# Patient Record
Sex: Female | Born: 1935 | Race: White | Hispanic: No | Marital: Married | Smoking: Never smoker
Health system: Southern US, Community
[De-identification: ages and names within clinical notes are randomized; demographics above are authoritative.]

## PROBLEM LIST (undated history)

## (undated) DIAGNOSIS — I38 Endocarditis, valve unspecified: Secondary | ICD-10-CM

## (undated) DIAGNOSIS — I10 Essential (primary) hypertension: Secondary | ICD-10-CM

## (undated) DIAGNOSIS — E876 Hypokalemia: Secondary | ICD-10-CM

## (undated) DIAGNOSIS — I4891 Unspecified atrial fibrillation: Secondary | ICD-10-CM

## (undated) DIAGNOSIS — I779 Disorder of arteries and arterioles, unspecified: Secondary | ICD-10-CM

## (undated) DIAGNOSIS — N189 Chronic kidney disease, unspecified: Secondary | ICD-10-CM

## (undated) DIAGNOSIS — C801 Malignant (primary) neoplasm, unspecified: Secondary | ICD-10-CM

## (undated) DIAGNOSIS — E119 Type 2 diabetes mellitus without complications: Secondary | ICD-10-CM

## (undated) DIAGNOSIS — I5032 Chronic diastolic (congestive) heart failure: Secondary | ICD-10-CM

## (undated) DIAGNOSIS — R0602 Shortness of breath: Secondary | ICD-10-CM

## (undated) DIAGNOSIS — R609 Edema, unspecified: Secondary | ICD-10-CM

## (undated) DIAGNOSIS — R600 Localized edema: Secondary | ICD-10-CM

## (undated) DIAGNOSIS — I2510 Atherosclerotic heart disease of native coronary artery without angina pectoris: Secondary | ICD-10-CM

## (undated) DIAGNOSIS — I272 Pulmonary hypertension, unspecified: Secondary | ICD-10-CM

## (undated) DIAGNOSIS — I739 Peripheral vascular disease, unspecified: Secondary | ICD-10-CM

## (undated) DIAGNOSIS — E785 Hyperlipidemia, unspecified: Secondary | ICD-10-CM

## (undated) DIAGNOSIS — I219 Acute myocardial infarction, unspecified: Secondary | ICD-10-CM

## (undated) HISTORY — DX: Peripheral vascular disease, unspecified: I73.9

## (undated) HISTORY — DX: Hypokalemia: E87.6

## (undated) HISTORY — DX: Essential (primary) hypertension: I10

## (undated) HISTORY — DX: Disorder of arteries and arterioles, unspecified: I77.9

## (undated) HISTORY — DX: Type 2 diabetes mellitus without complications: E11.9

## (undated) HISTORY — DX: Edema, unspecified: R60.9

## (undated) HISTORY — PX: CHOLECYSTECTOMY: SHX55

## (undated) HISTORY — DX: Chronic diastolic (congestive) heart failure: I50.32

## (undated) HISTORY — DX: Chronic kidney disease, unspecified: N18.9

## (undated) HISTORY — DX: Endocarditis, valve unspecified: I38

## (undated) HISTORY — DX: Acute myocardial infarction, unspecified: I21.9

## (undated) HISTORY — DX: Unspecified atrial fibrillation: I48.91

## (undated) HISTORY — PX: TOTAL ABDOMINAL HYSTERECTOMY: SHX209

## (undated) HISTORY — PX: SKIN CANCER EXCISION: SHX779

## (undated) HISTORY — PX: CARDIAC CATHETERIZATION: SHX172

## (undated) HISTORY — DX: Atherosclerotic heart disease of native coronary artery without angina pectoris: I25.10

## (undated) HISTORY — DX: Hyperlipidemia, unspecified: E78.5

## (undated) HISTORY — DX: Pulmonary hypertension, unspecified: I27.20

## (undated) HISTORY — DX: Localized edema: R60.0

---

## 1999-09-01 ENCOUNTER — Other Ambulatory Visit: Admission: RE | Admit: 1999-09-01 | Discharge: 1999-09-01 | Payer: Self-pay | Admitting: Family Medicine

## 2006-11-07 ENCOUNTER — Other Ambulatory Visit: Admission: RE | Admit: 2006-11-07 | Discharge: 2006-11-07 | Payer: Self-pay | Admitting: Family Medicine

## 2009-12-05 HISTORY — PX: CORONARY STENT PLACEMENT: SHX1402

## 2010-01-28 ENCOUNTER — Encounter: Payer: Self-pay | Admitting: Cardiology

## 2010-01-28 ENCOUNTER — Ambulatory Visit: Payer: Self-pay | Admitting: Cardiology

## 2010-01-29 ENCOUNTER — Encounter: Payer: Self-pay | Admitting: Cardiology

## 2010-01-30 ENCOUNTER — Encounter: Payer: Self-pay | Admitting: Cardiology

## 2010-02-01 ENCOUNTER — Encounter: Payer: Self-pay | Admitting: Cardiology

## 2010-02-03 ENCOUNTER — Encounter: Payer: Self-pay | Admitting: Cardiology

## 2010-02-04 ENCOUNTER — Ambulatory Visit: Payer: Self-pay | Admitting: Cardiovascular Disease

## 2010-02-04 ENCOUNTER — Inpatient Hospital Stay (HOSPITAL_COMMUNITY): Admission: RE | Admit: 2010-02-04 | Discharge: 2010-02-05 | Payer: Self-pay | Admitting: Cardiovascular Disease

## 2010-02-04 ENCOUNTER — Encounter: Payer: Self-pay | Admitting: Cardiology

## 2010-02-05 ENCOUNTER — Encounter: Payer: Self-pay | Admitting: Cardiology

## 2010-02-22 ENCOUNTER — Encounter: Payer: Self-pay | Admitting: Cardiology

## 2010-02-22 ENCOUNTER — Telehealth (INDEPENDENT_AMBULATORY_CARE_PROVIDER_SITE_OTHER): Payer: Self-pay | Admitting: *Deleted

## 2010-03-03 ENCOUNTER — Ambulatory Visit: Payer: Self-pay | Admitting: Cardiology

## 2010-03-03 DIAGNOSIS — E876 Hypokalemia: Secondary | ICD-10-CM

## 2010-03-03 DIAGNOSIS — R319 Hematuria, unspecified: Secondary | ICD-10-CM

## 2010-03-03 DIAGNOSIS — J984 Other disorders of lung: Secondary | ICD-10-CM

## 2010-03-03 DIAGNOSIS — I129 Hypertensive chronic kidney disease with stage 1 through stage 4 chronic kidney disease, or unspecified chronic kidney disease: Secondary | ICD-10-CM

## 2010-03-03 DIAGNOSIS — R0989 Other specified symptoms and signs involving the circulatory and respiratory systems: Secondary | ICD-10-CM | POA: Insufficient documentation

## 2010-03-03 DIAGNOSIS — N189 Chronic kidney disease, unspecified: Secondary | ICD-10-CM | POA: Insufficient documentation

## 2010-03-03 DIAGNOSIS — I509 Heart failure, unspecified: Secondary | ICD-10-CM | POA: Insufficient documentation

## 2010-03-03 DIAGNOSIS — I2510 Atherosclerotic heart disease of native coronary artery without angina pectoris: Secondary | ICD-10-CM

## 2010-03-03 DIAGNOSIS — I214 Non-ST elevation (NSTEMI) myocardial infarction: Secondary | ICD-10-CM

## 2010-03-03 DIAGNOSIS — R0609 Other forms of dyspnea: Secondary | ICD-10-CM

## 2010-03-12 ENCOUNTER — Ambulatory Visit: Payer: Self-pay | Admitting: Cardiology

## 2010-03-12 ENCOUNTER — Encounter: Payer: Self-pay | Admitting: Cardiology

## 2010-03-19 ENCOUNTER — Ambulatory Visit: Payer: Self-pay | Admitting: Cardiology

## 2010-04-02 ENCOUNTER — Ambulatory Visit: Payer: Self-pay | Admitting: Cardiology

## 2010-04-09 ENCOUNTER — Ambulatory Visit: Payer: Self-pay | Admitting: Cardiology

## 2010-05-10 ENCOUNTER — Telehealth (INDEPENDENT_AMBULATORY_CARE_PROVIDER_SITE_OTHER): Payer: Self-pay | Admitting: *Deleted

## 2010-05-31 ENCOUNTER — Ambulatory Visit: Payer: Self-pay | Admitting: Cardiology

## 2010-06-22 ENCOUNTER — Encounter: Payer: Self-pay | Admitting: Cardiology

## 2010-11-05 ENCOUNTER — Ambulatory Visit: Payer: Self-pay | Admitting: Cardiology

## 2011-01-04 NOTE — Assessment & Plan Note (Signed)
Summary: f/u on edema in feet/ankles --agh   Visit Type:  Follow-up Primary Provider:  Belva Agee PA  CC:  c/o leg edema.  History of Present Illness: the patient is an 77 year old female is here for coronary artery disease. Status post drug-eluting stent placement to the mid LAD in March of 2011. She has diabetes mellitus, hypercholesterolemia and hypertension. The patient also has moderate mitral regurgitation and moderate tricuspid regurgitation but no symptoms of heart failure. She has carotid artery disease and a recent carotid Dopplers done which showed less than 50% stenosis bilaterally. The patient had difficult to control blood pressure and is on multiple antihypertensive medications. She also has developed in the last several weeks late edema and was given by her primary care physician Lasix. Her edema has now resolved. Her blood pressure is also now under very good control. The fact that she is responding to diuretics is consistent with a plasma renin activity which was very low.  Cardiac standpoint the patient is doing well. She reports no chest pain shortness of breath orthopnea PND.  Preventive Screening-Counseling & Management  Alcohol-Tobacco     Smoking Status: never  Current Medications (verified): 1)  Tylenol 325 Mg Tabs (Acetaminophen) .... As Needed 2)  Tekturna 300 Mg Tabs (Aliskiren Fumarate) .... Take 1 Tablet By Mouth Once A Day 3)  Amaryl 2 Mg Tabs (Glimepiride) .... Take 1 Tablet By Mouth Once A Day 4)  Aspir-Trin 325 Mg Tbec (Aspirin) .... Take 1 Tablet By Mouth Once A Day 5)  Benazepril Hcl 20 Mg Tabs (Benazepril Hcl) .... Take 1 Tablet By Mouth Once A Day 6)  Plavix 75 Mg Tabs (Clopidogrel Bisulfate) .... Take 1 Tablet By Mouth Once A Day 7)  Metformin Hcl 1000 Mg Tabs (Metformin Hcl) .... Take 1 Tablet By Mouth Twice A Day 8)  Bystolic 20 Mg Tabs (Nebivolol Hcl) .... Take 1 Tablet By Mouth Once A Day 9)  Nitrostat 0.4 Mg Subl (Nitroglycerin) .... Use As  Directed 10)  Simvastatin 20 Mg Tabs (Simvastatin) .... Take 1 Tablet By Mouth Once A Day 11)  Trilipix 135 Mg Cpdr (Choline Fenofibrate) .... Take 1 Tablet By Mouth Once A Day 12)  Chlorthalidone 50 Mg Tabs (Chlorthalidone) .... Take 1 Tablet By Mouth Once A Day 13)  Percocet 5-325 Mg Tabs (Oxycodone-Acetaminophen) .... Take One By Mouth Every 6 Hours As Needed 14)  Amlodipine Besylate 10 Mg Tabs (Amlodipine Besylate) .... Take 1 Tablet By Mouth Once A Day 15)  Potassium Chloride Crys Cr 20 Meq Cr-Tabs (Potassium Chloride Crys Cr) .... Take 1/2 Tablet By Mouth Once A Day 16)  Furosemide 20 Mg Tabs (Furosemide) .... Take 1 Tablet By Mouth Once A Day  Allergies (verified): No Known Drug Allergies  Comments:  Nurse/Medical Assistant: The patient's medication bottles and allergies were reviewed with the patient and were updated in the Medication and Allergy Lists.  Past History:  Past Medical History: Last updated: 03/03/2010 Non ST elevated MI Respiratory distress Chronic renal insufficiency Hypertension Hypokalemia CHF DM  Past Surgical History: Last updated: 03/03/2010 Abdominal Hysterectomy-Total Cholecystectomy  Family History: Last updated: 03/03/2010 No strong hx of Coronary disease in family  Social History: Last updated: 03/03/2010 Retired  Alcohol Use - no Drug Use - no  Risk Factors: Smoking Status: never (05/31/2010)  Review of Systems  The patient denies fatigue, malaise, fever, weight gain/loss, vision loss, decreased hearing, hoarseness, chest pain, palpitations, shortness of breath, prolonged cough, wheezing, sleep apnea, coughing up blood, abdominal pain,  blood in stool, nausea, vomiting, diarrhea, heartburn, incontinence, blood in urine, muscle weakness, joint pain, leg swelling, rash, skin lesions, headache, fainting, dizziness, depression, anxiety, enlarged lymph nodes, easy bruising or bleeding, and environmental allergies.    Vital  Signs:  Patient profile:   76 year old female Height:      63 inches Weight:      121 pounds Pulse rate:   51 / minute BP sitting:   138 / 78  (left arm) Cuff size:   regular  Vitals Entered By: Carlye Grippe (May 31, 2010 11:11 AM) CC: c/o leg edema   Physical Exam  Additional Exam:  General: Well-developed, well-nourished in no distress, pale-appearing head: Normocephalic and atraumatic eyes PERRLA/EOMI intact, conjunctiva and lids normal nose: No deformity or lesions mouth normal dentition, normal posterior pharynx neck: Supple, no JVD.  No masses, thyromegaly or abnormal cervical nodes lungs: Normal breath sounds bilaterally without wheezing.  Normal percussion heart: regular rate and rhythm with normal S1 and S2, no S3 or S4.  PMI is normal.  No pathological murmurs abdomen: Normal bowel sounds, abdomen is soft and nontender without masses, organomegaly or hernias noted.  No hepatosplenomegaly musculoskeletal: Back normal, normal gait muscle strength and tone normal pulsus: Pulse is normal in all 4 extremities Extremities: No peripheral pitting edema neurologic: Alert and oriented x 3 skin: multiple facial skin lesions with keratosis cervical nodes: No significant adenopathy psychologic: Normal affect    Impression & Recommendations:  Problem # 1:  CAROTID BRUIT (ICD-785.9) no obstructive carotid artery disease.  Problem # 2:  ACUT MI SUBENDOCARDIAL INFARCT INIT EPIS CARE (ICD-410.71) status post stenting. Continue current therapy including Plavix Her updated medication list for this problem includes:    Aspir-trin 325 Mg Tbec (Aspirin) .Marland Kitchen... Take 1 tablet by mouth once a day    Benazepril Hcl 20 Mg Tabs (Benazepril hcl) .Marland Kitchen... Take 1 tablet by mouth once a day    Plavix 75 Mg Tabs (Clopidogrel bisulfate) .Marland Kitchen... Take 1 tablet by mouth once a day    Bystolic 20 Mg Tabs (Nebivolol hcl) .Marland Kitchen... Take 1 tablet by mouth once a day    Nitrostat 0.4 Mg Subl (Nitroglycerin)  ..... Use as directed    Amlodipine Besylate 10 Mg Tabs (Amlodipine besylate) .Marland Kitchen... Take 1 tablet by mouth once a day  Problem # 3:  CHF (ICD-428.0) the patient has some evidence of volume overload. I have given her a refill on her Lasix. Her updated medication list for this problem includes:    Aspir-trin 325 Mg Tbec (Aspirin) .Marland Kitchen... Take 1 tablet by mouth once a day    Benazepril Hcl 20 Mg Tabs (Benazepril hcl) .Marland Kitchen... Take 1 tablet by mouth once a day    Plavix 75 Mg Tabs (Clopidogrel bisulfate) .Marland Kitchen... Take 1 tablet by mouth once a day    Bystolic 20 Mg Tabs (Nebivolol hcl) .Marland Kitchen... Take 1 tablet by mouth once a day    Nitrostat 0.4 Mg Subl (Nitroglycerin) ..... Use as directed    Chlorthalidone 50 Mg Tabs (Chlorthalidone) .Marland Kitchen... Take 1 tablet by mouth once a day    Amlodipine Besylate 10 Mg Tabs (Amlodipine besylate) .Marland Kitchen... Take 1 tablet by mouth once a day    Furosemide 20 Mg Tabs (Furosemide) .Marland Kitchen... Take 1 tablet by mouth once a day  Problem # 4:  CHRONIC KIDNEY DISEASE UNSPECIFIED (ICD-585.9) creatinine stable with a GFR of 52 mL per minute  Patient Instructions: 1)  Your physician recommends that you continue on your  current medications as directed. Please refer to the Current Medication list given to you today. 2)  Follow up in  6 months Prescriptions: FUROSEMIDE 20 MG TABS (FUROSEMIDE) Take 1 tablet by mouth once a day  #30 x 6   Entered by:   Hoover Brunette, LPN   Authorized by:   Lewayne Bunting, MD, [facility]   Signed by:   Hoover Brunette, LPN on 96/03/5408   Method used:   Electronically to        [facility]* (retail)       [address]       [place], [place]  [address]       Ph: [phone]       Fax: [phone]   RxID:   [id number] POTASSIUM CHLORIDE CRYS CR 20 MEQ CR-TABS (POTASSIUM CHLORIDE CRYS CR) Take 1/2 tablet by mouth once a day  #15 x 6   Entered by:   Hoover Brunette, LPN   Authorized by:   Lewayne Bunting, MD, [facility]   Signed by:   Hoover Brunette, LPN on 25/36/6440   Method used:   Electronically to        [facility]* (retail)       [address]       [place], [place]  [address]       Ph: [phone]       Fax: [phone]   RxID:   [id number]

## 2011-01-04 NOTE — Assessment & Plan Note (Signed)
Summary: bp - srs  Nurse Visit   Vital Signs:  Patient profile:   77 year old female Height:      63 inches Weight:      129 pounds Pulse rate:   54 / minute BP sitting:   151 / 67  (left arm) Cuff size:   regular  Vitals Entered By: Carlye Grippe (Apr 09, 2010 9:03 AM) CC: nurse bp check Comments [id number] HTN--yes [id number] meds?--yes Side effects?--no Chest pain, SOB, Dizziness?--no A/P: 1. HTN (401.1)             At goal?              If no, physician will be notified.              Follow up in ...Marland KitchenMarland KitchenMarland Kitchen  5 minutes was spent with the patient.       Preventive Screening-Counseling & Management  Alcohol-Tobacco     Smoking Status: never  Visit Type:  Follow-up BP check by nurse  CC:  nurse bp check.   Current Medications (verified): 1)  Tylenol 325 Mg Tabs (Acetaminophen) .... As Needed 2)  Tekturna 300 Mg Tabs (Aliskiren Fumarate) .... Take 1 Tablet By Mouth Once A Day 3)  Amaryl 2 Mg Tabs (Glimepiride) .... Take 1 Tablet By Mouth Once A Day 4)  Aspir-Trin 325 Mg Tbec (Aspirin) .... Take 1 Tablet By Mouth Once A Day 5)  Benazepril Hcl 20 Mg Tabs (Benazepril Hcl) .... Take 1 Tablet By Mouth Once A Day 6)  Plavix 75 Mg Tabs (Clopidogrel Bisulfate) .... Take 1 Tablet By Mouth Once A Day 7)  Metformin Hcl 1000 Mg Tabs (Metformin Hcl) .... Take 1 Tablet By Mouth Twice A Day 8)  Bystolic 20 Mg Tabs (Nebivolol Hcl) .... Take 1 Tablet By Mouth Once A Day 9)  Nitrostat 0.4 Mg Subl (Nitroglycerin) .... Use As Directed 10)  Simvastatin 20 Mg Tabs (Simvastatin) .... Take 1 Tablet By Mouth Once A Day 11)  Trilipix 135 Mg Cpdr (Choline Fenofibrate) .... Take 1 Tablet By Mouth Once A Day 12)  Chlorthalidone 50 Mg Tabs (Chlorthalidone) .... Take 1 Tablet By Mouth Once A Day 13)  Percocet 5-325 Mg Tabs (Oxycodone-Acetaminophen) .... Take One By Mouth Every 6 Hours As Needed 14)  Amlodipine Besylate 10 Mg Tabs (Amlodipine Besylate) .... Take 1 Tablet By Mouth Once A  Day 15)  Potassium Chloride Crys Cr 20 Meq Cr-Tabs (Potassium Chloride Crys Cr) .... Take 1 Tablet By Mouth Once A Day  Allergies (verified): No Known Drug Allergies  Comments:  Nurse/Medical Assistant: The patient's medications and allergies were reviewed with the patient and were updated in the Medication and Allergy Lists. List reviewed.  Orders Added: 1)  Est. Patient Level I [27253] blood pressure much improved continue current medical therapy Lewayne Bunting, MD, [facility]  Apr 11, 2010 4:41 PM  Pt's daughter notified of results and verbalized understanding.  Cyril Loosen, RN, BSN  Apr 12, 2010 1:44 PM

## 2011-01-04 NOTE — Assessment & Plan Note (Signed)
Summary: nurse bp check on meds [place]  Nurse Visit   Vital Signs:  Patient profile:   77 year old female Height:      63 inches Weight:      128 pounds Pulse rate:   57 / minute BP sitting:   174 / 71  (left arm) Cuff size:   regular  Vitals Entered By: Carlye Grippe (March 19, 2010 9:07 AM)  Serial Vital Signs/Assessments:  Time      Position  BP       Pulse  Resp  Temp     By 9:17 AM             167/70   56                    Carlye Grippe  CC: nurse bp check Comments [id number] HTN--yes [id number] meds?---yes Side effects?--no Chest pain, SOB, Dizziness?--no A/P: 1. HTN (401.1)             At goal?              If no, physician will be notified.              Follow up in ...Marland KitchenMarland KitchenMarland Kitchen  5 minutes was spent with the patient.       Preventive Screening-Counseling & Management  Alcohol-Tobacco     Smoking Status: never  Visit Type:  nurse bp check  CC:  nurse bp check.   Current Medications (verified): 1)  Tylenol 325 Mg Tabs (Acetaminophen) .... As Needed 2)  Tekturna 300 Mg Tabs (Aliskiren Fumarate) .... Take 1 Tablet By Mouth Once A Day 3)  Amaryl 2 Mg Tabs (Glimepiride) .... Take 1 Tablet By Mouth Once A Day 4)  Aspir-Trin 325 Mg Tbec (Aspirin) .... Take 1 Tablet By Mouth Once A Day 5)  Benazepril Hcl 20 Mg Tabs (Benazepril Hcl) .... Take 1 Tablet By Mouth Once A Day 6)  Plavix 75 Mg Tabs (Clopidogrel Bisulfate) .... Take 1 Tablet By Mouth Once A Day 7)  Metformin Hcl 1000 Mg Tabs (Metformin Hcl) .... Take 1 Tablet By Mouth Twice A Day 8)  Bystolic 10 Mg Tabs (Nebivolol Hcl) .... Take 1 Tablet By Mouth Once A Day 9)  Nitrostat 0.4 Mg Subl (Nitroglycerin) .... Use As Directed 10)  Simvastatin 20 Mg Tabs (Simvastatin) .... Take 1 Tablet By Mouth Once A Day 11)  Trilipix 135 Mg Cpdr (Choline Fenofibrate) .... Take 1 Tablet By Mouth Once A Day 12)  Chlorthalidone 50 Mg Tabs (Chlorthalidone) .... Take 1 Tablet By Mouth Once A Day 13)  Percocet 5-325 Mg Tabs  (Oxycodone-Acetaminophen) .... Take One By Mouth Every 6 Hours As Needed 14)  Amlodipine Besylate 5 Mg Tabs (Amlodipine Besylate) .... Take 1 Tablet By Mouth Once A Day 15)  Potassium Chloride Crys Cr 20 Meq Cr-Tabs (Potassium Chloride Crys Cr) .... Take 1 Tablet By Mouth Once A Day  Allergies (verified): No Known Drug Allergies  Comments:  Nurse/Medical Assistant: The patient's medications and allergies were reviewed with the patient and were updated in the Medication and Allergy Lists. List reviewed.  Orders Added: 1)  Est. Patient Level I [82956] 2)  T- * Misc. Laboratory test [id number] Prescriptions: AMLODIPINE BESYLATE 10 MG TABS (AMLODIPINE BESYLATE) Take 1 tablet by mouth once a day  #30 x 6   Entered by:   Hoover Brunette, LPN   Authorized by:   Lewayne Bunting, MD, [facility]   Signed by:   Inocencio Homes  [place], LPN on 16/09/9603   Method used:   Electronically to        [facility]* (retail)       [address]       [place], [place]  [address]       Ph: [phone]       Fax: [phone]   RxID:   [id number]   Increase amlodipine to 10mg  by mouth qdaily and obtain plasma renin activity level. F/U with RN visit.  Lewayne Bunting, MD, [facility]  March 23, 2010 12:57 PM  Daughter notified of above.  Will place new rx of the 10mg  tablet to [facility].  Nurse visit scheduled for 4/29 at 8:45.  Will send order to the [facility].   Hoover Brunette, LPN  March 25, 2010 2:53 PM

## 2011-01-04 NOTE — Assessment & Plan Note (Signed)
Summary: new hosp fu   Visit Type:  hospital follow-up Primary Provider:  Belva Agee PA  CC:  hospital follow-up visit.  History of Present Illness: the patient is a 77 year old female who was recently admitted to [facility] for substernal chest pain and shortness of breath. The patient ruled in for non-ST elevation myocardial infarction. She was transferred to [facility] for cardiac catheterization on February 04, 2010. He was found to have severe LAD disease and underwent a drug-eluting stent placement of the mid LAD. The patient also has diabetes mellitus, hypercholesterolemia and hypertension. She states that she has much increased after her intervention. She reports no substernal chest pain and her dyspnea has much increased. She denies any orthopnea PND palpitations or syncope.  Of note that on echocardiogram the patient also had moderate MR and moderate tricuspid regurgitation but reports no heart failure symptoms. She was also found to have a left carotid bruit. She still reports some lower extremity edema and difficult to control blood pressure on multiple antihypertensive medications.  Preventive Screening-Counseling & Management  Alcohol-Tobacco     Smoking Status: never  Current Problems (verified): 1)  Coronary Atherosclerosis Native Coronary Artery  (ICD-414.01) 2)  Carotid Bruit  (ICD-785.9) 3)  Other Dyspnea and Respiratory Abnormalities  (ICD-786.09) 4)  [place]cut Mi Subendocardial Infarct Init Epis Care  (ICD-410.71) 5)  Other Pulmonary Insufficiency Nec  (ICD-518.82) 6)  CHF  (ICD-428.0) 7)  Dm  (ICD-250.00) 8)  Htn Ckd Uns W/ckd Stage I Thru Stage Iv/uns  (ICD-403.90) 9)  Chronic Kidney Disease Unspecified  (ICD-585.9) 10)  Hypopotassemia  (ICD-276.8) 11)  Hematuria Unspecified  (ICD-599.70)  Current Medications (verified): 1)  Tylenol 325 Mg Tabs (Acetaminophen) .... As Needed 2)  Tekturna 300 Mg Tabs (Aliskiren Fumarate) .... Take 1 Tablet By Mouth Once  A Day 3)  Amaryl 2 Mg Tabs (Glimepiride) .... Take 1 Tablet By Mouth Once A Day 4)  Aspir-Trin 325 Mg Tbec (Aspirin) .... Take 1 Tablet By Mouth Once A Day 5)  Benazepril Hcl 20 Mg Tabs (Benazepril Hcl) .... Take 1 Tablet By Mouth Once A Day 6)  Plavix 75 Mg Tabs (Clopidogrel Bisulfate) .... Take 1 Tablet By Mouth Once A Day 7)  Metformin Hcl 1000 Mg Tabs (Metformin Hcl) .... Take 1 Tablet By Mouth Twice A Day 8)  Bystolic 10 Mg Tabs (Nebivolol Hcl) .... Take 1 Tablet By Mouth Once A Day 9)  Nitrostat 0.4 Mg Subl (Nitroglycerin) .... Use As Directed 10)  Simvastatin 20 Mg Tabs (Simvastatin) .... Take 1 Tablet By Mouth Once A Day 11)  Trilipix 135 Mg Cpdr (Choline Fenofibrate) .... Take 1 Tablet By Mouth Once A Day 12)  Chlorthalidone 50 Mg Tabs (Chlorthalidone) .... Take 1 Tablet By Mouth Once A Day 13)  Percocet 5-325 Mg Tabs (Oxycodone-Acetaminophen) .... Take One By Mouth Every 6 Hours As Needed 14)  Amlodipine Besylate 5 Mg Tabs (Amlodipine Besylate) .... Take 1 Tablet By Mouth Once A Day  Allergies (verified): No Known Drug Allergies  Comments:  Nurse/Medical Assistant: The patient's medications and allergies were reviewed with the patient and were updated in the Medication and Allergy Lists. List reviewed.  Past History:  Past Medical History: Last updated: 03/03/2010 Non ST elevated MI Respiratory distress Chronic renal insufficiency Hypertension Hypokalemia CHF DM  Past Surgical History: Last updated: 03/03/2010 Abdominal Hysterectomy-Total Cholecystectomy  Family History: Last updated: 03/03/2010 No strong hx of Coronary disease in family  Social History: Last updated: 03/03/2010 Retired  Alcohol Use -  no Drug Use - no  Risk Factors: Smoking Status: never (03/03/2010)  Social History: Smoking Status:  never  Review of Systems       The patient complains of fatigue, shortness of breath, and skin lesions.  The patient denies malaise, fever, weight  gain/loss, vision loss, decreased hearing, hoarseness, chest pain, palpitations, prolonged cough, wheezing, sleep apnea, coughing up blood, abdominal pain, blood in stool, nausea, vomiting, diarrhea, heartburn, incontinence, blood in urine, muscle weakness, joint pain, leg swelling, rash, headache, fainting, dizziness, depression, anxiety, enlarged lymph nodes, easy bruising or bleeding, and environmental allergies.    Vital Signs:  Patient profile:   77 year old female Height:      63 inches Weight:      127 pounds BMI:     22.58 Pulse rate:   56 / minute BP sitting:   215 / 72  (left arm) Cuff size:   regular  Vitals Entered By: Carlye Grippe (March 03, 2010 10:49 AM)  Serial Vital Signs/Assessments:  Time      Position  BP       Pulse  Resp  Temp     By 10:54 AM            210/70   58                    Carlye Grippe  CC: hospital follow-up visit   Physical Exam  Additional Exam:  General: Well-developed, well-nourished in no distress, pale-appearing head: Normocephalic and atraumatic eyes PERRLA/EOMI intact, conjunctiva and lids normal nose: No deformity or lesions mouth normal dentition, normal posterior pharynx neck: Supple, no JVD.  No masses, thyromegaly or abnormal cervical nodes lungs: Normal breath sounds bilaterally without wheezing.  Normal percussion heart: regular rate and rhythm with normal S1 and S2, no S3 or S4.  PMI is normal.  No pathological murmurs abdomen: Normal bowel sounds, abdomen is soft and nontender without masses, organomegaly or hernias noted.  No hepatosplenomegaly musculoskeletal: Back normal, normal gait muscle strength and tone normal pulsus: Pulse is normal in all 4 extremities Extremities: No peripheral pitting edema neurologic: Alert and oriented x 3 skin: multiple facial skin lesions with keratosis cervical nodes: No significant adenopathy psychologic: Normal affect    Impression & Recommendations:  Problem # 1:  CORONARY  ATHEROSCLEROSIS NATIVE CORONARY ARTERY (ICD-414.01) continue current medical therapy with aspirin and Plavix. The patient denies intercurrent chest pain. Her updated medication list for this problem includes:    Aspir-trin 325 Mg Tbec (Aspirin) .Marland Kitchen... Take 1 tablet by mouth once a day    Benazepril Hcl 20 Mg Tabs (Benazepril hcl) .Marland Kitchen... Take 1 tablet by mouth once a day    Plavix 75 Mg Tabs (Clopidogrel bisulfate) .Marland Kitchen... Take 1 tablet by mouth once a day    Bystolic 10 Mg Tabs (Nebivolol hcl) .Marland Kitchen... Take 1 tablet by mouth once a day    Nitrostat 0.4 Mg Subl (Nitroglycerin) ..... Use as directed    Amlodipine Besylate 5 Mg Tabs (Amlodipine besylate) .Marland Kitchen... Take 1 tablet by mouth once a day  Orders: T-Basic Metabolic Panel ([id number])  Problem # 2:  CAROTID BRUIT (ICD-785.9) the patient is a left carotid bruit and carotid Dopplers will be obtained. She is at high risk for significant carotid artery disease. Orders: Carotid Duplex (Carotid Duplex)  Problem # 3:  ACUT MI SUBENDOCARDIAL INFARCT INIT EPIS CARE (ICD-410.71) patient status post stent placement with drug-eluting stents to the LAD. She denies any recurrent central chest  pain. Her updated medication list for this problem includes:    Aspir-trin 325 Mg Tbec (Aspirin) .Marland Kitchen... Take 1 tablet by mouth once a day    Benazepril Hcl 20 Mg Tabs (Benazepril hcl) .Marland Kitchen... Take 1 tablet by mouth once a day    Plavix 75 Mg Tabs (Clopidogrel bisulfate) .Marland Kitchen... Take 1 tablet by mouth once a day    Bystolic 10 Mg Tabs (Nebivolol hcl) .Marland Kitchen... Take 1 tablet by mouth once a day    Nitrostat 0.4 Mg Subl (Nitroglycerin) ..... Use as directed    Amlodipine Besylate 5 Mg Tabs (Amlodipine besylate) .Marland Kitchen... Take 1 tablet by mouth once a day  Problem # 4:  HTN CKD UNS W/CKD STAGE I THRU STAGE IV/UNS (ICD-403.90) blood pressures very poorly controlled. I will change the patient's Lasix chlorthalidone 50 mg p.o. q. daily and amlodipine 5 mg a day. We will obtain a full  electrolyte panel in 7-10 days.if the patient still has difficulty hypertension, then we will obtain a plasma renin activity to further guide treatment options. Her updated medication list for this problem includes:    Tekturna 300 Mg Tabs (Aliskiren fumarate) .Marland Kitchen... Take 1 tablet by mouth once a day    Aspir-trin 325 Mg Tbec (Aspirin) .Marland Kitchen... Take 1 tablet by mouth once a day    Benazepril Hcl 20 Mg Tabs (Benazepril hcl) .Marland Kitchen... Take 1 tablet by mouth once a day    Bystolic 10 Mg Tabs (Nebivolol hcl) .Marland Kitchen... Take 1 tablet by mouth once a day    Chlorthalidone 50 Mg Tabs (Chlorthalidone) .Marland Kitchen... Take 1 tablet by mouth once a day    Amlodipine Besylate 5 Mg Tabs (Amlodipine besylate) .Marland Kitchen... Take 1 tablet by mouth once a day  Orders: T-Basic Metabolic Panel [id number])  Patient Instructions: 1)  Stop Lasix 2)  Start Chlorthalidone 50mg  daily 3)  Start Amlodipine 5 mg daily 4)  Carotid Dopplers  5)  Labs:  BMET in 7-10 days 6)  Nurse visit also in 7-10 days for blood pressure check. 7)  Follow up in  6 months Prescriptions: AMLODIPINE BESYLATE 5 MG TABS (AMLODIPINE BESYLATE) Take 1 tablet by mouth once a day  #30 x 6   Entered by:   Hoover Brunette, LPN   Authorized by:   Lewayne Bunting, MD, [facility]   Signed by:   Hoover Brunette, LPN on 14/78/2956   Method used:   Electronically to        [facility] (retail)       [address]       [place], [place]  [address]       Ph: [phone]       Fax: [phone]   RxID:   [id number]   Handout requested. CHLORTHALIDONE 50 MG TABS (CHLORTHALIDONE) Take 1 tablet by mouth once a day  #30 x 6   Entered by:   Hoover Brunette, LPN   Authorized by:   Lewayne Bunting, MD, [facility]   Signed by:   Hoover Brunette, LPN on 25/95/6387   Method used:   Electronically to        [facility]* (retail)       [address]       [place], [place]  [address]       Ph: [phone]       Fax: [phone]    RxID:   [id number]   Handout  requested.

## 2011-01-04 NOTE — Progress Notes (Signed)
Summary: BP READING-PATIENT BROUGHT TO VISIT  BP READING-PATIENT BROUGHT TO VISIT   Imported By: Claudette Laws 03/03/2010 10:58:10  _____________________________________________________________________  External Attachment:    Type:   Image     Comment:   External Document

## 2011-01-04 NOTE — Progress Notes (Signed)
Summary: FEET & ANKLES SWELLING   Phone Note Call from Patient Call back at Home Phone [phone]   Caller: debbie hurd Reason for Call: Talk to Nurse Details for Reason: requesting to be seen today Summary of Call: [name] LEFT MESSAGE ON VOICEMAIL REQUESTING FOR Dennisha TO BE SEEN TODAY OR TOMORROW DUE TO LOWER LEGS SWELLING.  SHE IS SCHEDULED TO SEE DR. Andee Lineman ON 3/30.   Initial call taken by: Claudette Laws,  February 22, 2010 8:43 AM  Follow-up for Phone Call        Advised her to take pt. to PMD today.  If they feel she needs to be seen sooner, will need to call and request from MD.  Verbalized understanding.   Follow-up by: Hoover Brunette, LPN,  February 22, 2010 8:53 AM

## 2011-01-04 NOTE — Assessment & Plan Note (Signed)
Summary: bp check  Nurse Visit   Vital Signs:  Patient profile:   77 year old female Height:      63 inches Weight:      127 pounds Pulse rate:   56 / minute BP sitting:   196 / 78  (left arm) Cuff size:   regular  Vitals Entered By: Carlye Grippe (March 12, 2010 8:23 AM)  Serial Vital Signs/Assessments:  Time      Position  BP       Pulse  Resp  Temp     By 8:41 AM             184/71   53                    Carlye Grippe 8:43 AM   R Arm     181/65                         Carlye Grippe 8:43 AM                      55                    Carlye Grippe  CC: nurse bp check Comments patient usally takes meds around 7:30am and hasn't had medication today since she had labwork this morning.   [id number] HTN--yes WGN:[id number] meds?--yes Side effects?--no Chest pain, SOB, Dizziness?--no A/P: 1. HTN (401.1)             At goal?              If no, physician will be notified.              Follow up in ...Marland KitchenMarland KitchenMarland Kitchen  5 minutes was spent with the patient.  Needs BP check AFTER meds taken.  Lewayne Bunting, MD, [name]  March 17, 2010 1:46 PM  Patient"s informed of the above. Patient scheduled.  Visit Type:  nurse bp check  CC:  nurse bp check.   Preventive Screening-Counseling & Management  Alcohol-Tobacco     Smoking Status: never  Allergies (verified): No Known Drug Allergies  Orders Added: 1)  Est. Patient Level I [86578] 2)  T-Basic Metabolic Panel [phone] Prescriptions: POTASSIUM CHLORIDE CRYS CR 20 MEQ CR-TABS (POTASSIUM CHLORIDE CRYS CR) Take 1 tablet by mouth once a day  #30 x 1   Entered by:   Carlye Grippe   Authorized by:   Lewayne Bunting, MD, [facility]   Signed by:   Carlye Grippe on 03/17/2010   Method used:   Electronically to        [facility]* (retail)       [address]       [place], [place]  [address]       Ph: [phone]       Fax: [phone]   RxID:   [id number]

## 2011-01-04 NOTE — Progress Notes (Signed)
Summary: PHONE: FEET SWELLING  Phone Note Call from Patient Call back at Home Phone [phone]   Caller: -[name] (DAUGHTER) Summary of Call: Mrs. Devol's feet and ankles continue to swell. Was seen by Belva Agee @ [facility] Last Friday 6-2 and was put on Lasix 20mg . Daughter is concerned and wanted to know if she needs to follow up with Dr.Degent or continue with [facility]. She is scheduled this Thursday for potassium check. Initial call taken by: Zachary George,  May 10, 2010 9:15 AM  Follow-up for Phone Call        we'll be happy to see her first available. Follow-up by: Lewayne Bunting, MD, [facility],  May 12, 2010 3:32 PM  Additional Follow-up for Phone Call Additional follow up Details #1::        OV scheduled for 6/27 to f/u on these issues. Hoover Brunette, LPN  May 13, 9146 3:12 PM

## 2011-01-04 NOTE — Miscellaneous (Signed)
Summary: Home Care Report/ [facility]  Home Care Report/ [facility]   Imported By: Dorise Hiss 03/15/2010 10:14:47  _____________________________________________________________________  External Attachment:    Type:   Image     Comment:   External Document

## 2011-01-04 NOTE — Assessment & Plan Note (Signed)
Summary: bp check  --agh  Nurse Visit   Vital Signs:  Patient profile:   77 year old female Height:      63 inches Weight:      128 pounds Pulse rate:   64 / minute BP sitting:   174 / 68  (left arm) Cuff size:   regular  Vitals Entered By: Carlye Grippe (April 02, 2010 8:54 AM)  Patient Instructions: 1)  Your physician has recommended you make the following change in your medication: INCREASE BYSTOLIC TO 20MG .  You may take two of your 10mg  until they are finished. Your new prescription has been sent to your pharmacy.  2)  Your physician recommends that you schedule a follow-up appointment in: for nurse visit on MAY 6TH @9 :00AM.  3)  We will address your lab results with you at this visit.    Serial Vital Signs/Assessments:  Time      Position  BP       Pulse  Resp  Temp     By 9:06 AM             176/60                         Carlye Grippe  CC: nurse bp   Preventive Screening-Counseling & Management  Alcohol-Tobacco     Smoking Status: never  Current Medications (verified): 1)  Tylenol 325 Mg Tabs (Acetaminophen) .... As Needed 2)  Tekturna 300 Mg Tabs (Aliskiren Fumarate) .... Take 1 Tablet By Mouth Once A Day 3)  Amaryl 2 Mg Tabs (Glimepiride) .... Take 1 Tablet By Mouth Once A Day 4)  Aspir-Trin 325 Mg Tbec (Aspirin) .... Take 1 Tablet By Mouth Once A Day 5)  Benazepril Hcl 20 Mg Tabs (Benazepril Hcl) .... Take 1 Tablet By Mouth Once A Day 6)  Plavix 75 Mg Tabs (Clopidogrel Bisulfate) .... Take 1 Tablet By Mouth Once A Day 7)  Metformin Hcl 1000 Mg Tabs (Metformin Hcl) .... Take 1 Tablet By Mouth Twice A Day 8)  Bystolic 20 Mg Tabs (Nebivolol Hcl) .... Take 1 Tablet By Mouth Once A Day 9)  Nitrostat 0.4 Mg Subl (Nitroglycerin) .... Use As Directed 10)  Simvastatin 20 Mg Tabs (Simvastatin) .... Take 1 Tablet By Mouth Once A Day 11)  Trilipix 135 Mg Cpdr (Choline Fenofibrate) .... Take 1 Tablet By Mouth Once A Day 12)  Chlorthalidone 50 Mg Tabs (Chlorthalidone)  .... Take 1 Tablet By Mouth Once A Day 13)  Percocet 5-325 Mg Tabs (Oxycodone-Acetaminophen) .... Take One By Mouth Every 6 Hours As Needed 14)  Amlodipine Besylate 10 Mg Tabs (Amlodipine Besylate) .... Take 1 Tablet By Mouth Once A Day 15)  Potassium Chloride Crys Cr 20 Meq Cr-Tabs (Potassium Chloride Crys Cr) .... Take 1 Tablet By Mouth Once A Day  Allergies (verified): No Known Drug Allergies  Comments:  Nurse/Medical Assistant: The patient's medications and allergies were reviewed with the patient and were updated in the Medication and Allergy Lists. List reviewed.  Orders Added: 1)  Est. Patient Level I [16109] Prescriptions: BYSTOLIC 20 MG TABS (NEBIVOLOL HCL) Take 1 tablet by mouth once a day  #30 x 6   Entered by:   Carlye Grippe   Authorized by:   Lewayne Bunting, MD, [facility]   Signed by:   Carlye Grippe on 04/02/2010   Method used:   Faxed to ...       [facility]* (retail)  [address]       [place], [place]  [address]       Ph: [phone]       Fax: [phone]   RxID:   [id number]

## 2011-01-04 NOTE — Letter (Signed)
Summary: Internal Other/ PATIENT HISTORY FORM  Internal Other/ PATIENT HISTORY FORM   Imported By: Dorise Hiss 03/05/2010 09:58:47  _____________________________________________________________________  External Attachment:    Type:   Image     Comment:   External Document

## 2011-01-04 NOTE — Miscellaneous (Signed)
Summary: Home Care Report/ [facility]  Home Care Report/ [facility]   Imported By: Dorise Hiss 03/18/2010 12:22:35  _____________________________________________________________________  External Attachment:    Type:   Image     Comment:   External Document

## 2011-01-04 NOTE — Op Note (Signed)
Summary: Operative Report  Operative Report   Imported By: Zachary George 03/03/2010 09:17:16  _____________________________________________________________________  External Attachment:    Type:   Image     Comment:   External Document

## 2011-01-04 NOTE — Letter (Signed)
Summary: [facility] D[facility]C [facility]  [facility]C [facility]   Imported By: Zachary George 03/03/2010 09:16:54  _____________________________________________________________________  External Attachment:    Type:   Image     Comment:   External Document

## 2011-01-06 NOTE — Assessment & Plan Note (Signed)
Summary: 6 MO FU PER DEC REMINDER   Visit Type:  Follow-up Primary Provider:  Belva Agee PA   History of Present Illness: the patient is an 77 year old female with a history of coronary artery disease. She status post drug-eluting stent placement to the mid LAD in March of 2011. She has diabetes mellitus, hypercholesterolemia and hypertension. The patient also has moderate mitral regurgitation and moderate tricuspid regurgitation but no symptoms of heart failure. She has carotid artery disease and a recent carotid Doppler done which showed less than 50% stenosis bilaterally. The patient has difficult to control blood pressures on multiple antihypertensive medications. She takes her Lasix p.r.n. and close monitoring of her renal function. Her creatinine was 2.16 in July. She is followed by her primary care physician for this. She currently still taking Plavix and reports no complications. She reports no chest pain or shortness of breath. She has no palpitation. She somewhat limited in her exercise tolerance.  The patient reports to me that they found a "" spots on her liver and are planning to do a CT scan. Patient has been instructed that she cannot stop Plavix for at least a year.  Preventive Screening-Counseling & Management  Alcohol-Tobacco     Smoking Status: never  Current Medications (verified): 1)  Tylenol 325 Mg Tabs (Acetaminophen) .... As Needed 2)  Tekturna 300 Mg Tabs (Aliskiren Fumarate) .... Take 1 Tablet By Mouth Once A Day 3)  Amaryl 2 Mg Tabs (Glimepiride) .... Take 1 Tablet By Mouth Once A Day 4)  Aspir-Trin 325 Mg Tbec (Aspirin) .... Take 1 Tablet By Mouth Once A Day 5)  Benazepril Hcl 20 Mg Tabs (Benazepril Hcl) .... Take 1 Tablet By Mouth Once A Day 6)  Plavix 75 Mg Tabs (Clopidogrel Bisulfate) .... Take 1 Tablet By Mouth Once A Day 7)  Metformin Hcl 1000 Mg Tabs (Metformin Hcl) .... Take 1 Tablet By Mouth Twice A Day 8)  Bystolic 20 Mg Tabs (Nebivolol Hcl) .... Take 1  Tablet By Mouth Once A Day 9)  Nitrostat 0.4 Mg Subl (Nitroglycerin) .... Use As Directed 10)  Simvastatin 20 Mg Tabs (Simvastatin) .... Take 1 Tablet By Mouth Once A Day 11)  Trilipix 135 Mg Cpdr (Choline Fenofibrate) .... Take 1 Tablet By Mouth Once A Day 12)  Chlorthalidone 50 Mg Tabs (Chlorthalidone) .... Take 1 Tablet By Mouth Once A Day 13)  Percocet 5-325 Mg Tabs (Oxycodone-Acetaminophen) .... Take One By Mouth Every 6 Hours As Needed 14)  Amlodipine Besylate 10 Mg Tabs (Amlodipine Besylate) .... Take 1 Tablet By Mouth Once A Day 15)  Potassium Chloride Crys Cr 20 Meq Cr-Tabs (Potassium Chloride Crys Cr) .... Take 1/2 Tablet By Mouth Once A Day 16)  Furosemide 20 Mg Tabs (Furosemide) .... Take 1 Tablet By Mouth Once A Day As Needed  Allergies (verified): No Known Drug Allergies  Comments:  Nurse/Medical Assistant: The patient's medication list and allergies were reviewed with the patient and were updated in the Medication and Allergy Lists.  Past History:  Past Medical History: Last updated: 03/03/2010 Non ST elevated MI Respiratory distress Chronic renal insufficiency Hypertension Hypokalemia CHF DM  Past Surgical History: Last updated: 03/03/2010 Abdominal Hysterectomy-Total Cholecystectomy  Family History: Last updated: 03/03/2010 No strong hx of Coronary disease in family  Social History: Last updated: 03/03/2010 Retired  Alcohol Use - no Drug Use - no  Risk Factors: Smoking Status: never (11/05/2010)  Review of Systems       The patient complains of shortness  of breath.  The patient denies fatigue, malaise, fever, weight gain/loss, vision loss, decreased hearing, hoarseness, chest pain, palpitations, prolonged cough, wheezing, sleep apnea, coughing up blood, abdominal pain, blood in stool, nausea, vomiting, diarrhea, heartburn, incontinence, blood in urine, muscle weakness, joint pain, leg swelling, rash, skin lesions, headache, fainting, dizziness,  depression, anxiety, enlarged lymph nodes, easy bruising or bleeding, and environmental allergies.    Vital Signs:  Patient profile:   77 year old female Height:      63 inches Weight:      124 pounds Pulse rate:   53 / minute BP sitting:   149 / 77  (left arm) Cuff size:   regular  Vitals Entered By: Carlye Grippe (November 05, 2010 9:14 AM)  Physical Exam  Additional Exam:  General: Well-developed, well-nourished in no distress, pale-appearing head: Normocephalic and atraumatic eyes PERRLA/EOMI intact, conjunctiva and lids normal nose: No deformity or lesions mouth normal dentition, normal posterior pharynx neck: Supple, no JVD.  No masses, thyromegaly or abnormal cervical nodes lungs: Normal breath sounds bilaterally without wheezing.  Normal percussion heart: regular rate and rhythm with normal S1 and S2, no S3 or S4.  PMI is normal.  No pathological murmurs abdomen: Normal bowel sounds, abdomen is soft and nontender without masses, organomegaly or hernias noted.  No hepatosplenomegaly musculoskeletal: Back normal, normal gait muscle strength and tone normal pulsus: Pulse is normal in all 4 extremities Extremities: No peripheral pitting edema neurologic: Alert and oriented x 3 skin: multiple facial skin lesions with keratosis cervical nodes: No significant adenopathy psychologic: Normal affect    Impression & Recommendations:  Problem # 1:  CORONARY ATHEROSCLEROSIS NATIVE CORONARY ARTERY (ICD-414.01) stable no recurrent chest pain. The patient had a stent in March of 2011. She will need to stay on Plavix for at least a year. Her updated medication list for this problem includes:    Aspir-trin 325 Mg Tbec (Aspirin) .Marland Kitchen... Take 1 tablet by mouth once a day    Benazepril Hcl 20 Mg Tabs (Benazepril hcl) .Marland Kitchen... Take 1 tablet by mouth once a day    Plavix 75 Mg Tabs (Clopidogrel bisulfate) .Marland Kitchen... Take 1 tablet by mouth once a day    Bystolic 20 Mg Tabs (Nebivolol hcl) .Marland Kitchen... Take  1 tablet by mouth once a day    Nitrostat 0.4 Mg Subl (Nitroglycerin) ..... Use as directed    Amlodipine Besylate 10 Mg Tabs (Amlodipine besylate) .Marland Kitchen... Take 1 tablet by mouth once a day  Problem # 2:  CAROTID BRUIT (ICD-785.9) no significant carotid artery stenosis  Problem # 3:  CHRONIC KIDNEY DISEASE UNSPECIFIED (ICD-585.9) followed by her primary care physician.  Patient Instructions: 1)  Your physician recommends that you continue on your current medications as directed. Please refer to the Current Medication list given to you today. 2)  Follow up in  6 months

## 2011-02-28 LAB — BASIC METABOLIC PANEL
BUN: 18 mg/dL (ref 6–23)
CO2: 31 mEq/L (ref 19–32)
Chloride: 104 mEq/L (ref 96–112)
Creatinine, Ser: 1.12 mg/dL (ref 0.4–1.2)
Glucose, Bld: 190 mg/dL — ABNORMAL HIGH (ref 70–99)

## 2011-02-28 LAB — POCT I-STAT 3, ART BLOOD GAS (G3+)
Bicarbonate: 34.7 mEq/L — ABNORMAL HIGH (ref 20.0–24.0)
O2 Saturation: 87 %
TCO2: 36 mmol/L (ref 0–100)
pCO2 arterial: 49.1 mmHg — ABNORMAL HIGH (ref 35.0–45.0)

## 2011-02-28 LAB — GLUCOSE, CAPILLARY
Glucose-Capillary: 150 mg/dL — ABNORMAL HIGH (ref 70–99)
Glucose-Capillary: 157 mg/dL — ABNORMAL HIGH (ref 70–99)

## 2011-02-28 LAB — CBC
MCHC: 33.5 g/dL (ref 30.0–36.0)
MCV: 84.5 fL (ref 78.0–100.0)
Platelets: 260 10*3/uL (ref 150–400)
RDW: 16.4 % — ABNORMAL HIGH (ref 11.5–15.5)

## 2011-02-28 LAB — POCT I-STAT 3, VENOUS BLOOD GAS (G3P V)
Acid-Base Excess: 10 mmol/L — ABNORMAL HIGH (ref 0.0–2.0)
Bicarbonate: 35.7 mEq/L — ABNORMAL HIGH (ref 20.0–24.0)
O2 Saturation: 55 %
pCO2, Ven: 53.9 mmHg — ABNORMAL HIGH (ref 45.0–50.0)
pO2, Ven: 29 mmHg — CL (ref 30.0–45.0)

## 2011-03-22 ENCOUNTER — Encounter: Payer: Self-pay | Admitting: Nurse Practitioner

## 2011-03-22 DIAGNOSIS — E559 Vitamin D deficiency, unspecified: Secondary | ICD-10-CM | POA: Insufficient documentation

## 2011-03-22 DIAGNOSIS — E785 Hyperlipidemia, unspecified: Secondary | ICD-10-CM

## 2011-03-24 ENCOUNTER — Other Ambulatory Visit: Payer: Self-pay | Admitting: *Deleted

## 2011-03-24 MED ORDER — CHLORTHALIDONE 25 MG PO TABS: 50.0000 mg | ORAL_TABLET | Freq: Every day | ORAL | Status: DC

## 2011-04-21 ENCOUNTER — Other Ambulatory Visit: Payer: Self-pay | Admitting: *Deleted

## 2011-04-21 MED ORDER — AMLODIPINE BESYLATE 10 MG PO TABS: 10.0000 mg | ORAL_TABLET | Freq: Every day | ORAL | Status: DC

## 2011-05-16 ENCOUNTER — Encounter: Payer: Self-pay | Admitting: Cardiology

## 2011-05-24 ENCOUNTER — Ambulatory Visit (INDEPENDENT_AMBULATORY_CARE_PROVIDER_SITE_OTHER): Payer: Medicare Other | Admitting: Cardiology

## 2011-05-24 ENCOUNTER — Encounter: Payer: Self-pay | Admitting: Cardiology

## 2011-05-24 VITALS — BP 159/61 | HR 54 | Ht 63.0 in | Wt 124.0 lb

## 2011-05-24 DIAGNOSIS — E1159 Type 2 diabetes mellitus with other circulatory complications: Secondary | ICD-10-CM

## 2011-05-24 DIAGNOSIS — I739 Peripheral vascular disease, unspecified: Secondary | ICD-10-CM | POA: Insufficient documentation

## 2011-05-24 DIAGNOSIS — I129 Hypertensive chronic kidney disease with stage 1 through stage 4 chronic kidney disease, or unspecified chronic kidney disease: Secondary | ICD-10-CM

## 2011-05-24 DIAGNOSIS — I2510 Atherosclerotic heart disease of native coronary artery without angina pectoris: Secondary | ICD-10-CM

## 2011-05-24 DIAGNOSIS — I509 Heart failure, unspecified: Secondary | ICD-10-CM

## 2011-05-24 MED ORDER — ASPIRIN EC 81 MG PO TBEC: 81.0000 mg | DELAYED_RELEASE_TABLET | Freq: Every day | ORAL | Status: AC

## 2011-05-24 MED ORDER — NITROGLYCERIN 0.4 MG SL SUBL: 0.4000 mg | SUBLINGUAL_TABLET | SUBLINGUAL | Status: DC | PRN

## 2011-05-24 NOTE — Assessment & Plan Note (Signed)
The patient may peripheral vascular disease given the absence of pulses in her soft visit posterior tibial areas we will proceed with ABIs bilaterally.

## 2011-05-24 NOTE — Progress Notes (Signed)
HPI The patient is a 77 year old female with a history of coronary artery disease, status post drug-eluting stent placement to the mid LAD in March of 2011. She has diabetes mellitus and hypercholesterolemia as well as hypertension. She also has moderate mitral regurgitation moderate tricuspid regurgitation but no evidence of heart failure. She has carotid disease with Dopplers and a year ago which showed less than 50% stenosis bilaterally. In the past she had difficult to control blood pressure and has been on multiple medications. Her blood pressure is now somewhat better controlled. She also has renal insufficiency and uses when necessary Lasix for lower extremity edema. However she's unable to tolerate daily Lasix because of increased and worsening renal function. She also has remained on Plavix and reports no complications. Her EKG shows normal sinus rhythm with no acute changes.  The patient is contemplating to undergo cataract surgery.  She reports that her lower extremity edema is relatively under control with when necessary Lasix. However there are some erythematous changes to the lower extremities and the patient does not have very good pulses in the lower extremities.  No Known Allergies  Current Outpatient Prescriptions on File Prior to Visit  Medication Sig Dispense Refill  . acetaminophen (TYLENOL) 325 MG tablet Take 650 mg by mouth every 6 (six) hours as needed.        Marland Kitchen aliskiren (TEKTURNA) 300 MG tablet Take 300 mg by mouth daily.        Marland Kitchen amLODipine (NORVASC) 10 MG tablet Take 1 tablet (10 mg total) by mouth daily.  30 tablet  6  . benazepril (LOTENSIN) 20 MG tablet Take 20 mg by mouth daily.        . chlorthalidone (HYGROTON) 25 MG tablet Take 2 tablets (50 mg total) by mouth daily.  60 tablet  6  . Choline Fenofibrate (TRILIPIX) 135 MG capsule Take 135 mg by mouth daily.        . clopidogrel (PLAVIX) 75 MG tablet Take 75 mg by mouth daily.        . furosemide (LASIX) 20 MG tablet  Take 20 mg by mouth daily. Prn        . glimepiride (AMARYL) 2 MG tablet Take 2 mg by mouth daily before breakfast.        . metFORMIN (GLUCOPHAGE) 1000 MG tablet Take 1,000 mg by mouth 2 (two) times daily with a meal.        . Nebivolol HCl (BYSTOLIC) 20 MG TABS Take 1 tablet by mouth daily.       Marland Kitchen oxyCODONE-acetaminophen (PERCOCET) 5-325 MG per tablet Take 1 tablet by mouth every 4 (four) hours as needed.        . potassium chloride (KLOR-CON) 10 MEQ CR tablet Take 10 mEq by mouth daily.        . simvastatin (ZOCOR) 20 MG tablet Take 20 mg by mouth at bedtime.        Marland Kitchen DISCONTD: nitroGLYCERIN (NITROSTAT) 0.4 MG SL tablet Place 0.4 mg under the tongue every 5 (five) minutes as needed.        Marland Kitchen DISCONTD: aspirin 81 MG EC tablet Take 81 mg by mouth daily.          Past Medical History  Diagnosis Date  . Respiratory distress   . Chronic renal insufficiency   . Hypertension   . Hypokalemia   . CHF (congestive heart failure)   . DM (diabetes mellitus)   . Myocardial infarction     Non ST  elevated MI    Past Surgical History  Procedure Date  . Total abdominal hysterectomy   . Cholecystectomy     Family History  Problem Relation Age of Onset  . Coronary artery disease Neg Hx     History   Social History  . Marital Status: Married    Spouse Name: N/A    Number of Children: N/A  . Years of Education: N/A   Occupational History  . Retired    Social History Main Topics  . Smoking status: Never Smoker   . Smokeless tobacco: Never Used  . Alcohol Use: No  . Drug Use: Not on file  . Sexually Active: Not on file   Other Topics Concern  . Not on file   Social History Narrative  . No narrative on file    [id number] positives as outlined above. The remainder of the 18  point review of systems is negative   PHYSICAL EXAM BP 159/61  Pulse 54  Ht 5\' 3"  (1.6 m)  Wt 124 lb (56.246 kg)  BMI 21.97 kg/m2  SpO2 100%  General: Well-developed, well-nourished in no  distress Head: Normocephalic and atraumatic Eyes:PERRLA/EOMI intact, conjunctiva and lids normal Ears: No deformity or lesions Mouth:normal dentition, normal posterior pharynx Neck: Supple, no JVD.  No masses, thyromegaly or abnormal cervical nodes Lungs: Normal breath sounds bilaterally without wheezing.  Normal percussion Cardiac: regular rate and rhythm with normal S1 and S2, no S3 or S4.  PMI is normal.  No pathological murmurs Abdomen: Normal bowel sounds, abdomen is soft and nontender without masses, organomegaly or hernias noted.  No hepatosplenomegaly MSK: Back normal, normal gait muscle strength and tone normal Vascular: I'm unable to palpate dorsalis pedis and posterior tibial pulses in both lower extremities.  Extremities: 2+ peripheral pitting edema with erythema of the lower extremities  Neurologic: Alert and oriented x 3 Skin: Multiple lesions/keratotic on the face. And on the scalp  Lymphatics: No significant adenopathy Psychologic: Normal affect   ECG: Sinus bradycardia. Heart rate 52 beats per minute nonspecific ST-T wave changes  ASSESSMENT AND PLAN

## 2011-05-24 NOTE — Assessment & Plan Note (Signed)
Abnormal renal function followed by the patient's primary care physician

## 2011-05-24 NOTE — Assessment & Plan Note (Addendum)
Despite moderate mitral and moderate tricuspid regurgitation the patient has no clinical evidence of heart failure. She can continue to lose Lasix on a when necessary basis.

## 2011-05-24 NOTE — Patient Instructions (Signed)
Your physician wants you to follow-up in: 6 months. You will receive a reminder letter in the mail one-two months in advance. If you don't receive a letter, please call our office to schedule the follow-up appointment. Decrease Aspirin to 81 mg daily.  Your physician has requested that you have an ankle brachial index (ABI). During this test an ultrasound and blood pressure cuff are used to evaluate the arteries that supply the arms and legs with blood. Allow thirty minutes for this exam. There are no restrictions or special instructions. If the results of your test are normal or stable, you will receive a letter. If they are abnormal, the nurse will contact you by phone.

## 2011-05-24 NOTE — Assessment & Plan Note (Addendum)
Status post drug-eluting stent to the mid LAD in March of 2011. The patient remains on Plavix. She reports no recurrent chest pain. I told the patient also that there is no contraindication to proceed with cataract surgery provided that she hold Plavix 5-7 days before her procedure.

## 2011-06-13 ENCOUNTER — Telehealth: Payer: Self-pay | Admitting: *Deleted

## 2011-06-13 NOTE — Telephone Encounter (Signed)
Daughter Rubye Oaks) came by office to inquire about recent ABI's done.  Advised her that test was normal & letter was just mailed out on 7/6 stating this.  Dghtr states her feet are still swollen and questions if there are any other test that Dr. Andee Lineman would suggest ordering.  Advised her that he is out of office till 7/16 & should take to PMD if still having problems since test normal.  States she would like for me to leave message for GD & if he does not suggest anything else she will take her to PMD.

## 2011-06-20 NOTE — Telephone Encounter (Signed)
Suggest to discuss with primary care physician. Her lower extremity edema is likely secondary to her valvular disease it would be okay to increase Lasix but this is difficult because of her renal insufficiency. I be happy to discuss his during the next clinic visit in the meanwhile a daughter is concerned about lower extremity edema and earlier visit with her primary care physician may be beneficial.

## 2011-06-20 NOTE — Telephone Encounter (Signed)
Daughter Eunice Blase) notified & verbalized understanding.

## 2011-07-08 ENCOUNTER — Other Ambulatory Visit: Payer: Self-pay | Admitting: *Deleted

## 2011-07-08 MED ORDER — POTASSIUM CHLORIDE 10 MEQ PO TBCR: 10.0000 meq | EXTENDED_RELEASE_TABLET | Freq: Every day | ORAL | Status: DC

## 2011-08-06 HISTORY — PX: EYE SURGERY: SHX253

## 2011-08-24 ENCOUNTER — Encounter (HOSPITAL_COMMUNITY)
Admission: RE | Admit: 2011-08-24 | Discharge: 2011-08-24 | Disposition: A | Discharge status: Home / Self Care | Payer: Medicare Other | Source: Ambulatory Visit | Attending: Ophthalmology | Admitting: Ophthalmology

## 2011-08-24 ENCOUNTER — Encounter (HOSPITAL_COMMUNITY): Payer: Self-pay

## 2011-08-24 HISTORY — DX: Shortness of breath: R06.02

## 2011-08-24 HISTORY — DX: Malignant (primary) neoplasm, unspecified: C80.1

## 2011-08-24 LAB — CBC
Hemoglobin: 12.3 g/dL (ref 12.0–15.0)
MCH: 28.7 pg (ref 26.0–34.0)
MCHC: 31.9 g/dL (ref 30.0–36.0)

## 2011-08-24 LAB — BASIC METABOLIC PANEL
BUN: 32 mg/dL — ABNORMAL HIGH (ref 6–23)
Calcium: 10.9 mg/dL — ABNORMAL HIGH (ref 8.4–10.5)
GFR calc non Af Amer: 45 mL/min — ABNORMAL LOW (ref >60)
Glucose, Bld: 178 mg/dL — ABNORMAL HIGH (ref 70–99)
Sodium: 142 mEq/L (ref 135–145)

## 2011-08-24 NOTE — Patient Instructions (Addendum)
20 Robin [name]  08/24/2011   Your procedure is scheduled on:  08/29/2011  Report to [facility] at  930 AM.  Call this number if you have problems the morning of surgery: [phone]   Remember:   Do not eat food:After Midnight.  Do not drink clear liquids: After Midnight.  Take these medicines the morning of surgery with A SIP OF WATER: bystolic,norvasc,lotensin  Do not wear jewelry, make-up or nail polish.  Do not wear lotions, powders, or perfumes. You may wear deodorant.  Do not shave 48 hours prior to surgery.  Do not bring valuables to the hospital.  Contacts, dentures or bridgework may not be worn into surgery.  Leave suitcase in the car. After surgery it may be brought to your room.  For patients admitted to the hospital, checkout time is 11:00 AM the day of discharge.   Patients discharged the day of surgery will not be allowed to drive home.  Name and phone number of your driver: family  Special Instructions: N/A   Please read over the following fact sheets that you were given: Pain Booklet, Surgical Site Infection Prevention, Anesthesia Post-op Instructions and Care and Recovery After Surgery PATIENT INSTRUCTIONS POST-ANESTHESIA  IMMEDIATELY FOLLOWING SURGERY:  Do not drive or operate machinery for the first twenty four hours after surgery.  Do not make any important decisions for twenty four hours after surgery or while taking narcotic pain medications or sedatives.  If you develop intractable nausea and vomiting or a severe headache please notify your doctor immediately.  FOLLOW-UP:  Please make an appointment with your surgeon as instructed. You do not need to follow up with anesthesia unless specifically instructed to do so.  WOUND CARE INSTRUCTIONS (if applicable):  Keep a dry clean dressing on the anesthesia/puncture wound site if there is drainage.  Once the wound has quit draining you may leave it open to air.  Generally you should leave the bandage intact for twenty four  hours unless there is drainage.  If the epidural site drains for more than 36-48 hours please call the anesthesia department.  QUESTIONS?:  Please feel free to call your physician or the hospital operator if you have any questions, and they will be happy to assist you.     [facility] Anesthesia Department [address] [place] [phone]

## 2011-08-29 ENCOUNTER — Encounter (HOSPITAL_COMMUNITY): Payer: Self-pay | Admitting: Anesthesiology

## 2011-08-29 ENCOUNTER — Encounter (HOSPITAL_COMMUNITY): Payer: Self-pay | Admitting: *Deleted

## 2011-08-29 ENCOUNTER — Encounter (HOSPITAL_COMMUNITY)
Admission: RE | Disposition: A | Discharge status: Home / Self Care | Payer: Self-pay | Source: Ambulatory Visit | Attending: Ophthalmology

## 2011-08-29 ENCOUNTER — Ambulatory Visit (HOSPITAL_COMMUNITY)
Admission: RE | Admit: 2011-08-29 | Discharge: 2011-08-29 | Disposition: A | Discharge status: Home / Self Care | Payer: Medicare Other | Source: Ambulatory Visit | Attending: Ophthalmology | Admitting: Ophthalmology

## 2011-08-29 ENCOUNTER — Ambulatory Visit (HOSPITAL_COMMUNITY): Payer: Medicare Other | Admitting: Anesthesiology

## 2011-08-29 DIAGNOSIS — H268 Other specified cataract: Secondary | ICD-10-CM | POA: Insufficient documentation

## 2011-08-29 DIAGNOSIS — Z79899 Other long term (current) drug therapy: Secondary | ICD-10-CM | POA: Insufficient documentation

## 2011-08-29 DIAGNOSIS — H2589 Other age-related cataract: Secondary | ICD-10-CM | POA: Insufficient documentation

## 2011-08-29 DIAGNOSIS — I10 Essential (primary) hypertension: Secondary | ICD-10-CM | POA: Insufficient documentation

## 2011-08-29 DIAGNOSIS — E119 Type 2 diabetes mellitus without complications: Secondary | ICD-10-CM | POA: Insufficient documentation

## 2011-08-29 DIAGNOSIS — Z01812 Encounter for preprocedural laboratory examination: Secondary | ICD-10-CM | POA: Insufficient documentation

## 2011-08-29 HISTORY — PX: CATARACT EXTRACTION W/PHACO: SHX586

## 2011-08-29 SURGERY — PHACOEMULSIFICATION, CATARACT, WITH IOL INSERTION
Anesthesia: Monitor Anesthesia Care | Site: Eye | Laterality: Right | Wound class: Clean

## 2011-08-29 MED ORDER — MIDAZOLAM HCL 2 MG/2ML IJ SOLN
INTRAMUSCULAR | Status: AC
  Administered 2011-08-29: 2 mg via INTRAVENOUS
  Filled 2011-08-29: qty 2

## 2011-08-29 MED ORDER — NEOMYCIN-POLYMYXIN-DEXAMETH 3.5-10000-0.1 OP OINT
TOPICAL_OINTMENT | OPHTHALMIC | Status: AC
  Filled 2011-08-29: qty 3.5

## 2011-08-29 MED ORDER — BSS IO SOLN
INTRAOCULAR | Status: DC | PRN
  Administered 2011-08-29: 11:00:00

## 2011-08-29 MED ORDER — LIDOCAINE HCL 3.5 % OP GEL
OPHTHALMIC | Status: AC
  Administered 2011-08-29: 1 via OPHTHALMIC
  Filled 2011-08-29: qty 5

## 2011-08-29 MED ORDER — TETRACAINE HCL 0.5 % OP SOLN
OPHTHALMIC | Status: AC
  Administered 2011-08-29: 1 [drp] via OPHTHALMIC
  Filled 2011-08-29: qty 2

## 2011-08-29 MED ORDER — POVIDONE-IODINE 5 % OP SOLN
OPHTHALMIC | Status: DC | PRN
  Administered 2011-08-29: 1 via OPHTHALMIC

## 2011-08-29 MED ORDER — LIDOCAINE HCL (PF) 1 % IJ SOLN
INTRAOCULAR | Status: DC | PRN
  Administered 2011-08-29: 12:00:00 via OPHTHALMIC

## 2011-08-29 MED ORDER — BSS IO SOLN
INTRAOCULAR | Status: DC | PRN
  Administered 2011-08-29: 15 mL via OPHTHALMIC

## 2011-08-29 MED ORDER — NEOMYCIN-POLYMYXIN-DEXAMETH 0.1 % OP OINT
TOPICAL_OINTMENT | OPHTHALMIC | Status: DC | PRN
  Administered 2011-08-29: 1 via OPHTHALMIC

## 2011-08-29 MED ORDER — EPINEPHRINE HCL 1 MG/ML IJ SOLN
INTRAMUSCULAR | Status: AC
  Filled 2011-08-29: qty 1

## 2011-08-29 MED ORDER — LACTATED RINGERS IV SOLN
INTRAVENOUS | Status: DC | PRN
  Administered 2011-08-29: 11:00:00 via INTRAVENOUS

## 2011-08-29 MED ORDER — CYCLOPENTOLATE-PHENYLEPHRINE 0.2-1 % OP SOLN
1.0000 [drp] | OPHTHALMIC | Status: AC
  Administered 2011-08-29 (×3): 1 [drp] via OPHTHALMIC

## 2011-08-29 MED ORDER — CYCLOPENTOLATE-PHENYLEPHRINE 0.2-1 % OP SOLN
OPHTHALMIC | Status: AC
  Administered 2011-08-29: 1 [drp] via OPHTHALMIC
  Filled 2011-08-29: qty 2

## 2011-08-29 MED ORDER — PROVISC 10 MG/ML IO SOLN
INTRAOCULAR | Status: DC | PRN
  Administered 2011-08-29: 8.5 mg via OPHTHALMIC

## 2011-08-29 MED ORDER — LIDOCAINE HCL (PF) 1 % IJ SOLN
INTRAMUSCULAR | Status: DC
Start: 2011-08-29 — End: 2011-08-29
  Filled 2011-08-29: qty 2

## 2011-08-29 MED ORDER — TETRACAINE HCL 0.5 % OP SOLN
1.0000 [drp] | OPHTHALMIC | Status: AC
  Administered 2011-08-29 (×3): 1 [drp] via OPHTHALMIC

## 2011-08-29 MED ORDER — LACTATED RINGERS IV SOLN
INTRAVENOUS | Status: DC
  Administered 2011-08-29: 500 mL via INTRAVENOUS

## 2011-08-29 MED ORDER — PHENYLEPHRINE HCL 2.5 % OP SOLN
1.0000 [drp] | OPHTHALMIC | Status: AC
  Administered 2011-08-29 (×3): 1 [drp] via OPHTHALMIC

## 2011-08-29 MED ORDER — MIDAZOLAM HCL 2 MG/2ML IJ SOLN
1.0000 mg | INTRAMUSCULAR | Status: DC | PRN
  Administered 2011-08-29: 2 mg via INTRAVENOUS

## 2011-08-29 MED ORDER — LIDOCAINE HCL 3.5 % OP GEL
1.0000 "application " | Freq: Once | OPHTHALMIC | Status: AC
  Administered 2011-08-29: 1 via OPHTHALMIC

## 2011-08-29 MED ORDER — LIDOCAINE 3.5 % OP GEL OPTIME - NO CHARGE
OPHTHALMIC | Status: DC | PRN
  Administered 2011-08-29: 1 [drp] via OPHTHALMIC

## 2011-08-29 MED ORDER — PHENYLEPHRINE HCL 2.5 % OP SOLN
OPHTHALMIC | Status: AC
  Administered 2011-08-29: 1 [drp] via OPHTHALMIC
  Filled 2011-08-29: qty 2

## 2011-08-29 SURGICAL SUPPLY — 34 items
CAPSULAR TENSION RING-AMO (OPHTHALMIC RELATED) IMPLANT
CLOTH BEACON ORANGE TIMEOUT ST (SAFETY) ×1 IMPLANT
DUOVISC SYSTEM (INTRAOCULAR LENS)
EYE SHIELD UNIVERSAL CLEAR (GAUZE/BANDAGES/DRESSINGS) ×1 IMPLANT
GLOVE BIO SURGEON STRL SZ 6.5 (GLOVE) IMPLANT
GLOVE BIOGEL PI IND STRL 6.5 (GLOVE) IMPLANT
GLOVE BIOGEL PI IND STRL 7.0 (GLOVE) IMPLANT
GLOVE BIOGEL PI IND STRL 7.5 (GLOVE) IMPLANT
GLOVE BIOGEL PI INDICATOR 6.5 (GLOVE) ×1
GLOVE BIOGEL PI INDICATOR 7.0 (GLOVE)
GLOVE BIOGEL PI INDICATOR 7.5 (GLOVE)
GLOVE ECLIPSE 6.5 STRL STRAW (GLOVE) IMPLANT
GLOVE ECLIPSE 7.0 STRL STRAW (GLOVE) IMPLANT
GLOVE ECLIPSE 7.5 STRL STRAW (GLOVE) IMPLANT
GLOVE EXAM NITRILE LRG STRL (GLOVE) IMPLANT
GLOVE EXAM NITRILE MD LF STRL (GLOVE) ×1 IMPLANT
GLOVE SKINSENSE NS SZ6.5 (GLOVE)
GLOVE SKINSENSE NS SZ7.0 (GLOVE)
GLOVE SKINSENSE STRL SZ6.5 (GLOVE) IMPLANT
GLOVE SKINSENSE STRL SZ7.0 (GLOVE) IMPLANT
KIT VITRECTOMY (OPHTHALMIC RELATED) IMPLANT
PAD ARMBOARD 7.5X6 YLW CONV (MISCELLANEOUS) ×1 IMPLANT
PROC W NO LENS (INTRAOCULAR LENS)
PROC W SPEC LENS (INTRAOCULAR LENS)
PROCESS W NO LENS (INTRAOCULAR LENS) IMPLANT
PROCESS W SPEC LENS (INTRAOCULAR LENS) IMPLANT
RING MALYGIN (MISCELLANEOUS) IMPLANT
SIGHTPATH CAT PROC W REG LENS (Ophthalmic Related) ×2 IMPLANT
SYR TB 1ML LL NO SAFETY (SYRINGE) ×1 IMPLANT
SYSTEM DUOVISC (INTRAOCULAR LENS) IMPLANT
TAPE SURG TRANSPORE 1 IN (GAUZE/BANDAGES/DRESSINGS) IMPLANT
TAPE SURGICAL TRANSPORE 1 IN (GAUZE/BANDAGES/DRESSINGS) ×1
VISCOELASTIC ADDITIONAL (OPHTHALMIC RELATED) ×1 IMPLANT
WATER STERILE IRR 250ML POUR (IV SOLUTION) ×1 IMPLANT

## 2011-08-29 NOTE — Anesthesia Postprocedure Evaluation (Signed)
  Anesthesia Post-op Note  Patient: Robin [name]  Procedure(s) Performed:  CATARACT EXTRACTION PHACO AND INTRAOCULAR LENS PLACEMENT (IOC) - CDE: 10.25  Patient Location: PACU and Short Stay  Anesthesia Type: MAC  Level of Consciousness: awake, alert , oriented and patient cooperative  Airway and Oxygen Therapy: Patient Spontanous Breathing  Post-op Pain: none  Post-op Assessment: Post-op Vital signs reviewed, Patient's Cardiovascular Status Stable, Respiratory Function Stable, Patent Airway and No signs of Nausea or vomiting  Post-op Vital Signs: Reviewed and stable  Complications: No apparent anesthesia complications

## 2011-08-29 NOTE — H&P (Signed)
I have evaluated the patient preoperatively, and have identified no interval changes in medical condition and plan of care since the history and physical of record 

## 2011-08-29 NOTE — Brief Op Note (Signed)
Pre-Op Dx: Cataract OD Post-Op Dx: Cataract OD Surgeon: Malayjah Otoole Anesthesia: Topical with MAC Implant: Lenstec, Model Softec HD Blood Loss: None Specimen: None Complications: None 

## 2011-08-29 NOTE — Anesthesia Preprocedure Evaluation (Addendum)
Anesthesia Evaluation  Name, MR# and DOB Patient awake  General Assessment Comment  Reviewed: Allergy & Precautions, H&P , NPO status , Patient's Chart, lab work & pertinent test results  History of Anesthesia Complications Negative for: history of anesthetic complications  Airway Mallampati: III    Mouth opening: Limited Mouth Opening  Dental  (+) Edentulous Upper and Edentulous Lower   Pulmonary (+) shortness of breath   clear to auscultation  breath sounds clear to auscultation none    Cardiovascular hypertension, Pt. on medications + CAD, + Past MI, + Cardiac Stents and +CHF Regular Bradycardia    Neuro/Psych   GI/Hepatic/Renal   Endo/Other  (+) Diabetes mellitus-, Well Controlled, Type 2, Oral Hypoglycemic Agents,     Abdominal   Musculoskeletal   Hematology   Peds  Reproductive/Obstetrics    Anesthesia Other Findings             Anesthesia Physical Anesthesia Plan  ASA: III  Anesthesia Plan: MAC   Post-op Pain Management:    Induction: Intravenous  Airway Management Planned: Nasal Cannula  Additional Equipment:   Intra-op Plan:   Post-operative Plan:   Informed Consent: I have reviewed the patients History and Physical, chart, labs and discussed the procedure including the risks, benefits and alternatives for the proposed anesthesia with the patient or authorized representative who has indicated his/her understanding and acceptance.     Plan Discussed with:   Anesthesia Plan Comments:         Anesthesia Quick Evaluation

## 2011-08-29 NOTE — Op Note (Signed)
Robin [name], Robin [name]                ACCOUNT NO.:  [id number]  MEDICAL RECORD NO.:  [id number]  LOCATION:  [facility]                          FACILITY:  [facility]  PHYSICIAN:  Susanne Greenhouse, MD       DATE OF BIRTH:  04/04/1928  DATE OF PROCEDURE:  08/29/2011 DATE OF DISCHARGE:                              OPERATIVE REPORT   POSTOPERATIVE DIAGNOSES: 1. 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  POSTOPERATIVE DIAGNOSES: 1. 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  PROCEDURE:  Phacoemulsification with posterior chamber intraocular lens implantation, right eye.  SURGEON:  Susanne Greenhouse, MD  ANESTHESIA:  General endotracheal anesthesia.  OPERATIVE SUMMARY:  In the preoperative area, dilating drops were placed into the right eye.  The patient was then brought into the operating room where she was placed under general anesthesia.  The eye was then prepped and draped.  Beginning with a 75 blade, a paracentesis port was made at the surgeon's 2 o'clock position.  The anterior chamber was then filled with a 1% nonpreserved lidocaine solution with epinephrine.  This was followed by Viscoat to deepen the chamber.  A small fornix-based peritomy was performed superiorly.  Next, a single iris hook was placed through the limbus superiorly.  A 2.4-mm keratome blade was then used to make a clear corneal incision over the iris hook.  A bent cystotome needle and Utrata forceps were used to create a continuous tear capsulotomy.  Hydrodissection was performed using balanced salt solution on a fine cannula.  The lens nucleus was then removed using phacoemulsification in a quadrant cracking technique.  The cortical material was then removed with irrigation and aspiration.  The capsular bag and anterior chamber were refilled with Provisc.  The wound was widened to approximately 3  mm and a posterior chamber intraocular lens was placed into the capsular bag without difficulty using an [employer] lens injecting system.  A single 10-0 nylon suture was then used to close the incision as well as stromal hydration.  The Provisc was removed from the anterior chamber and capsular bag with irrigation and aspiration.  At this point, the wounds were tested for leak, which were negative.  The anterior chamber remained deep and stable.  The patient tolerated the procedure well.  There were no operative complications, and she awoke from general anesthesia without problem.  There were no surgical specimens.  PROSTHETIC DEVICE USED:  A Lenstec posterior chamber lens, model Softec HD, power of 20.0, serial number is [id number].          ______________________________ Susanne Greenhouse, MD     KEH/MEDQ  D:  08/29/2011  T:  08/29/2011  Job:  [id number]

## 2011-08-29 NOTE — Transfer of Care (Signed)
Immediate Anesthesia Transfer of Care Note  Patient: Robin [name]  Procedure(s) Performed:  CATARACT EXTRACTION PHACO AND INTRAOCULAR LENS PLACEMENT (IOC) - CDE: 10.25  Patient Location: PACU and Short Stay  Anesthesia Type: MAC  Level of Consciousness: awake, alert , oriented and patient cooperative  Airway & Oxygen Therapy: Patient Spontanous Breathing  Post-op Assessment: Report given to PACU RN, Post -op Vital signs reviewed and stable and Patient moving all extremities  Post vital signs: Reviewed and stable  Complications: No apparent anesthesia complications

## 2011-09-01 ENCOUNTER — Encounter (HOSPITAL_COMMUNITY): Payer: Self-pay | Admitting: Ophthalmology

## 2011-09-07 ENCOUNTER — Encounter (HOSPITAL_COMMUNITY): Payer: Self-pay | Admitting: *Deleted

## 2011-09-07 ENCOUNTER — Encounter (HOSPITAL_COMMUNITY): Admission: RE | Admit: 2011-09-07 | Payer: Medicare Other | Source: Ambulatory Visit | Admitting: Ophthalmology

## 2011-09-07 MED ORDER — ONDANSETRON HCL 4 MG/2ML IJ SOLN: 4.0000 mg | Freq: Once | INTRAMUSCULAR | Status: DC | PRN

## 2011-09-07 MED ORDER — FENTANYL CITRATE 0.05 MG/ML IJ SOLN: 25.0000 ug | INTRAMUSCULAR | Status: DC | PRN

## 2011-09-08 ENCOUNTER — Ambulatory Visit (HOSPITAL_COMMUNITY): Payer: Medicare Other | Admitting: Anesthesiology

## 2011-09-08 ENCOUNTER — Encounter (HOSPITAL_COMMUNITY): Payer: Self-pay | Admitting: Anesthesiology

## 2011-09-08 ENCOUNTER — Encounter (HOSPITAL_COMMUNITY)
Admission: RE | Disposition: A | Discharge status: Home / Self Care | Payer: Self-pay | Source: Ambulatory Visit | Attending: Ophthalmology

## 2011-09-08 ENCOUNTER — Encounter (HOSPITAL_COMMUNITY): Payer: Self-pay | Admitting: Ophthalmology

## 2011-09-08 ENCOUNTER — Ambulatory Visit (HOSPITAL_COMMUNITY)
Admission: RE | Admit: 2011-09-08 | Discharge: 2011-09-08 | Disposition: A | Discharge status: Home / Self Care | Payer: Medicare Other | Source: Ambulatory Visit | Attending: Ophthalmology | Admitting: Ophthalmology

## 2011-09-08 DIAGNOSIS — I10 Essential (primary) hypertension: Secondary | ICD-10-CM | POA: Insufficient documentation

## 2011-09-08 DIAGNOSIS — H2589 Other age-related cataract: Secondary | ICD-10-CM | POA: Insufficient documentation

## 2011-09-08 DIAGNOSIS — E119 Type 2 diabetes mellitus without complications: Secondary | ICD-10-CM | POA: Insufficient documentation

## 2011-09-08 DIAGNOSIS — H268 Other specified cataract: Secondary | ICD-10-CM | POA: Insufficient documentation

## 2011-09-08 DIAGNOSIS — Z01812 Encounter for preprocedural laboratory examination: Secondary | ICD-10-CM | POA: Insufficient documentation

## 2011-09-08 DIAGNOSIS — Z7982 Long term (current) use of aspirin: Secondary | ICD-10-CM | POA: Insufficient documentation

## 2011-09-08 DIAGNOSIS — Z79899 Other long term (current) drug therapy: Secondary | ICD-10-CM | POA: Insufficient documentation

## 2011-09-08 HISTORY — PX: CATARACT EXTRACTION W/PHACO: SHX586

## 2011-09-08 SURGERY — PHACOEMULSIFICATION, CATARACT, WITH IOL INSERTION
Anesthesia: Monitor Anesthesia Care | Site: Eye | Laterality: Left | Wound class: Clean

## 2011-09-08 MED ORDER — TETRACAINE HCL 0.5 % OP SOLN
OPHTHALMIC | Status: AC
  Administered 2011-09-08: 1 [drp] via OPHTHALMIC
  Filled 2011-09-08: qty 2

## 2011-09-08 MED ORDER — BSS IO SOLN
INTRAOCULAR | Status: DC | PRN
  Administered 2011-09-08: 15 mL via OPHTHALMIC

## 2011-09-08 MED ORDER — EPINEPHRINE HCL 1 MG/ML IJ SOLN
INTRAOCULAR | Status: DC | PRN
  Administered 2011-09-08: 14:00:00

## 2011-09-08 MED ORDER — POVIDONE-IODINE 5 % OP SOLN
OPHTHALMIC | Status: DC | PRN
  Administered 2011-09-08: 1 via OPHTHALMIC

## 2011-09-08 MED ORDER — TETRACAINE HCL 0.5 % OP SOLN
1.0000 [drp] | OPHTHALMIC | Status: AC
  Administered 2011-09-08 (×3): 1 [drp] via OPHTHALMIC

## 2011-09-08 MED ORDER — EPINEPHRINE HCL 1 MG/ML IJ SOLN
INTRAMUSCULAR | Status: AC
  Filled 2011-09-08: qty 1

## 2011-09-08 MED ORDER — LIDOCAINE HCL (PF) 1 % IJ SOLN
INTRAOCULAR | Status: DC | PRN
  Administered 2011-09-08: 14:00:00 via OPHTHALMIC

## 2011-09-08 MED ORDER — MIDAZOLAM HCL 2 MG/2ML IJ SOLN
INTRAMUSCULAR | Status: AC
  Filled 2011-09-08: qty 2

## 2011-09-08 MED ORDER — CYCLOPENTOLATE-PHENYLEPHRINE 0.2-1 % OP SOLN
OPHTHALMIC | Status: AC
  Administered 2011-09-08: 1 [drp] via OPHTHALMIC
  Filled 2011-09-08: qty 2

## 2011-09-08 MED ORDER — LIDOCAINE HCL (PF) 1 % IJ SOLN
INTRAMUSCULAR | Status: AC
  Filled 2011-09-08: qty 2

## 2011-09-08 MED ORDER — MIDAZOLAM HCL 2 MG/2ML IJ SOLN
1.0000 mg | INTRAMUSCULAR | Status: DC | PRN
  Administered 2011-09-08: 2 mg via INTRAVENOUS

## 2011-09-08 MED ORDER — LACTATED RINGERS IV SOLN
INTRAVENOUS | Status: DC
  Administered 2011-09-08: 13:00:00 via INTRAVENOUS

## 2011-09-08 MED ORDER — LIDOCAINE HCL 3.5 % OP GEL
OPHTHALMIC | Status: AC
  Administered 2011-09-08: 1 via OPHTHALMIC
  Filled 2011-09-08: qty 5

## 2011-09-08 MED ORDER — PHENYLEPHRINE HCL 2.5 % OP SOLN
1.0000 [drp] | OPHTHALMIC | Status: AC
  Administered 2011-09-08 (×3): 1 [drp] via OPHTHALMIC

## 2011-09-08 MED ORDER — LIDOCAINE HCL 3.5 % OP GEL
1.0000 "application " | Freq: Once | OPHTHALMIC | Status: AC
  Administered 2011-09-08: 1 via OPHTHALMIC

## 2011-09-08 MED ORDER — PHENYLEPHRINE HCL 2.5 % OP SOLN
OPHTHALMIC | Status: AC
  Administered 2011-09-08: 1 [drp] via OPHTHALMIC
  Filled 2011-09-08: qty 2

## 2011-09-08 MED ORDER — CYCLOPENTOLATE-PHENYLEPHRINE 0.2-1 % OP SOLN
1.0000 [drp] | OPHTHALMIC | Status: AC
  Administered 2011-09-08 (×3): 1 [drp] via OPHTHALMIC

## 2011-09-08 MED ORDER — NEOMYCIN-POLYMYXIN-DEXAMETH 3.5-10000-0.1 OP OINT
TOPICAL_OINTMENT | OPHTHALMIC | Status: AC
  Filled 2011-09-08: qty 3.5

## 2011-09-08 MED ORDER — NEOMYCIN-POLYMYXIN-DEXAMETH 0.1 % OP OINT
TOPICAL_OINTMENT | OPHTHALMIC | Status: DC | PRN
  Administered 2011-09-08: 1 via OPHTHALMIC

## 2011-09-08 MED ORDER — PROVISC 10 MG/ML IO SOLN
INTRAOCULAR | Status: DC | PRN
  Administered 2011-09-08: 8.5 mg via OPHTHALMIC

## 2011-09-08 SURGICAL SUPPLY — 34 items
CAPSULAR TENSION RING-AMO (OPHTHALMIC RELATED) IMPLANT
CLOTH BEACON ORANGE TIMEOUT ST (SAFETY) ×1 IMPLANT
DUOVISC SYSTEM (INTRAOCULAR LENS)
EYE SHIELD UNIVERSAL CLEAR (GAUZE/BANDAGES/DRESSINGS) ×1 IMPLANT
GLOVE BIO SURGEON STRL SZ 6.5 (GLOVE) IMPLANT
GLOVE BIOGEL PI IND STRL 6.5 (GLOVE) IMPLANT
GLOVE BIOGEL PI IND STRL 7.0 (GLOVE) IMPLANT
GLOVE BIOGEL PI IND STRL 7.5 (GLOVE) IMPLANT
GLOVE BIOGEL PI INDICATOR 6.5 (GLOVE) ×1
GLOVE BIOGEL PI INDICATOR 7.0 (GLOVE)
GLOVE BIOGEL PI INDICATOR 7.5 (GLOVE)
GLOVE ECLIPSE 6.5 STRL STRAW (GLOVE) IMPLANT
GLOVE ECLIPSE 7.0 STRL STRAW (GLOVE) IMPLANT
GLOVE ECLIPSE 7.5 STRL STRAW (GLOVE) IMPLANT
GLOVE EXAM NITRILE LRG STRL (GLOVE) IMPLANT
GLOVE EXAM NITRILE MD LF STRL (GLOVE) ×1 IMPLANT
GLOVE SKINSENSE NS SZ6.5 (GLOVE)
GLOVE SKINSENSE NS SZ7.0 (GLOVE)
GLOVE SKINSENSE STRL SZ6.5 (GLOVE) IMPLANT
GLOVE SKINSENSE STRL SZ7.0 (GLOVE) IMPLANT
KIT VITRECTOMY (OPHTHALMIC RELATED) IMPLANT
PAD ARMBOARD 7.5X6 YLW CONV (MISCELLANEOUS) ×1 IMPLANT
PROC W NO LENS (INTRAOCULAR LENS)
PROC W SPEC LENS (INTRAOCULAR LENS)
PROCESS W NO LENS (INTRAOCULAR LENS) IMPLANT
PROCESS W SPEC LENS (INTRAOCULAR LENS) IMPLANT
RING MALYGIN (MISCELLANEOUS) IMPLANT
SIGHTPATH CAT PROC W REG LENS (Ophthalmic Related) ×2 IMPLANT
SYR TB 1ML LL NO SAFETY (SYRINGE) ×1 IMPLANT
SYSTEM DUOVISC (INTRAOCULAR LENS) IMPLANT
TAPE SURG TRANSPARENT 2IN (GAUZE/BANDAGES/DRESSINGS) IMPLANT
TAPE TRANSPARENT 2IN (GAUZE/BANDAGES/DRESSINGS) ×1
VISCOELASTIC ADDITIONAL (OPHTHALMIC RELATED) IMPLANT
WATER STERILE IRR 250ML POUR (IV SOLUTION) ×1 IMPLANT

## 2011-09-08 NOTE — Op Note (Signed)
NAMEDANETRA, GLOCK                ACCOUNT NO.:  [id number]  MEDICAL RECORD NO.:  [id number]  LOCATION:  [facility]                          FACILITY:  [facility]  PHYSICIAN:  Susanne Greenhouse, MD       DATE OF BIRTH:  [birth year]  DATE OF PROCEDURE:  09/08/2011 DATE OF DISCHARGE:  09/08/2011                              OPERATIVE REPORT   PREOPERATIVE DIAGNOSES: 1. Combined cataract, left eye, diagnosis code 366.19. 2. 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  POSTOPERATIVE DIAGNOSES: 1. Combined cataract, left eye, diagnosis code 366.19. 2. 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  PROCEDURE PERFORMED:  Phacoemulsification with intraocular lens implantation, left eye.  SURGEON:  Susanne Greenhouse, MD  DESCRIPTION OF OPERATION:  In the preoperative holding area, dilating drops and viscous lidocaine were placed into the left eye.  The patient was then brought to the operating room where she was prepped and draped. Beginning with a 75-blade, a paracentesis port was made at the surgeon's 2 o'clock position.  The anterior chamber was then filled with a 1% nonpreserved lidocaine solution.  This was followed by filling the anterior chamber with Provisc.  The 2.4-mm keratome blade was then used to make a clear corneal incision at the temporal limbus.  A bent cystotome needle and Utrata forceps were used to create a continuous tear capsulotomy.  Hydrodissection was performed with balanced salt solution on a fine cannula.  The lens nucleus was then removed using phacoemulsification in a quadrant cracking technique.  Residual cortex was removed with irrigation and aspiration.  The capsular bag and anterior chamber were then refilled with Provisc and a posterior chamber intraocular lens was placed into the capsular bag without difficulty using a lens injecting system.  The Provisc was removed from the capsular bag and anterior chamber with irrigation and aspiration. Stromal hydration of  the main incision and paracentesis ports was performed with balanced salt solution on a fine cannula.  The wounds were tested for leak, which were negative.  The patient tolerated the procedure well.  There were no operative complications and she was returned to the recovery area in satisfactory condition.  No surgical specimens. Prosthetic device used was a Lenstec posterior chamber lens, model Softec HD, power of 20.5, serial number is [id number].          ______________________________ Susanne Greenhouse, MD     KEH/MEDQ  D:  09/08/2011  T:  09/08/2011  Job:  [id number]

## 2011-09-08 NOTE — Anesthesia Procedure Notes (Signed)
Procedure Name: MAC Date/Time: 09/08/2011 1:37 PM Performed by: Minerva Areola Pre-anesthesia Checklist: Patient identified, Patient being monitored, Timeout performed, Emergency Drugs available and Suction available Patient Re-evaluated:Patient Re-evaluated prior to inductionOxygen Delivery Method: Nasal Cannula

## 2011-09-08 NOTE — Brief Op Note (Signed)
Pre-Op Dx: Cataract OS Post-Op Dx: Cataract OS Surgeon: Senora Lacson Anesthesia: Topical with MAC Implant: Lenstec, Model Softec HD Specimen: None Complications: None 

## 2011-09-08 NOTE — Anesthesia Postprocedure Evaluation (Signed)
  Anesthesia Post-op Note  Patient: Robin [name]  Procedure(s) Performed:  CATARACT EXTRACTION PHACO AND INTRAOCULAR LENS PLACEMENT (IOC) - CDE: 10.77  Patient Location:  Short Stay  Anesthesia Type: MAC  Level of Consciousness: awake  Airway and Oxygen Therapy: Patient Spontanous Breathing  Post-op Pain: none  Post-op Assessment: Post-op Vital signs reviewed, Patient's Cardiovascular Status Stable, Respiratory Function Stable, Patent Airway, No signs of Nausea or vomiting and Pain level controlled  Post-op Vital Signs: Reviewed and stable  Complications: No apparent anesthesia complications

## 2011-09-08 NOTE — H&P (Signed)
I have reviewed the H&P, the patient was re-examined, and I have identified no interval changes in medical condition and plan of care since the history and physical of record  

## 2011-09-08 NOTE — Anesthesia Preprocedure Evaluation (Signed)
Anesthesia Evaluation  Name, MR# and DOB Patient awake  General Assessment Comment  Reviewed: Allergy & Precautions, H&P , NPO status , Patient's Chart, lab work & pertinent test results  History of Anesthesia Complications Negative for: history of anesthetic complications  Airway Mallampati: III    Mouth opening: Limited Mouth Opening  Dental  (+) Edentulous Upper and Edentulous Lower   Pulmonary (+) shortness of breath  clear to auscultation        Cardiovascular hypertension, Pt. on medications + CAD, + Past MI, + Cardiac Stents and +CHF Regular Bradycardia    Neuro/Psych    GI/Hepatic   Endo/Other  Diabetes mellitus-, Well Controlled, Type 2, Oral Hypoglycemic Agents  Renal/GU      Musculoskeletal   Abdominal   Peds  Hematology   Anesthesia Other Findings   Reproductive/Obstetrics                           Anesthesia Physical Anesthesia Plan  ASA: III  Anesthesia Plan: MAC   Post-op Pain Management:    Induction:   Airway Management Planned: Nasal Cannula  Additional Equipment:   Intra-op Plan:   Post-operative Plan:   Informed Consent: I have reviewed the patients History and Physical, chart, labs and discussed the procedure including the risks, benefits and alternatives for the proposed anesthesia with the patient or authorized representative who has indicated his/her understanding and acceptance.     Plan Discussed with:   Anesthesia Plan Comments:         Anesthesia Quick Evaluation

## 2011-09-08 NOTE — Transfer of Care (Signed)
Immediate Anesthesia Transfer of Care Note  Patient: Robin [name]  Procedure(s) Performed:  CATARACT EXTRACTION PHACO AND INTRAOCULAR LENS PLACEMENT (IOC) - CDE: 10.77  Patient Location: Shortstay  Anesthesia Type: MAC  Level of Consciousness: awake  Airway & Oxygen Therapy: Patient Spontanous Breathing   Post-op Assessment: Report given to PACU RN, Post -op Vital signs reviewed and stable and Patient moving all extremities  Post vital signs: Reviewed and stable  Complications: No apparent anesthesia complications

## 2011-09-15 ENCOUNTER — Encounter (HOSPITAL_COMMUNITY): Payer: Self-pay | Admitting: Ophthalmology

## 2011-09-22 ENCOUNTER — Other Ambulatory Visit: Payer: Self-pay | Admitting: *Deleted

## 2011-09-22 MED ORDER — NEBIVOLOL HCL 20 MG PO TABS: 1.0000 | ORAL_TABLET | Freq: Every day | ORAL | Status: DC

## 2011-11-18 ENCOUNTER — Other Ambulatory Visit: Payer: Self-pay | Admitting: *Deleted

## 2011-11-18 MED ORDER — AMLODIPINE BESYLATE 10 MG PO TABS: 10.0000 mg | ORAL_TABLET | Freq: Every day | ORAL | Status: DC

## 2011-11-21 ENCOUNTER — Encounter: Payer: Self-pay | Admitting: Cardiology

## 2011-11-21 ENCOUNTER — Ambulatory Visit (INDEPENDENT_AMBULATORY_CARE_PROVIDER_SITE_OTHER): Payer: Medicare Other | Admitting: Cardiology

## 2011-11-21 VITALS — BP 146/71 | HR 56 | Ht 63.0 in | Wt 129.0 lb

## 2011-11-21 DIAGNOSIS — I2510 Atherosclerotic heart disease of native coronary artery without angina pectoris: Secondary | ICD-10-CM

## 2011-11-21 DIAGNOSIS — E785 Hyperlipidemia, unspecified: Secondary | ICD-10-CM

## 2011-11-21 DIAGNOSIS — R0989 Other specified symptoms and signs involving the circulatory and respiratory systems: Secondary | ICD-10-CM

## 2011-11-21 DIAGNOSIS — I059 Rheumatic mitral valve disease, unspecified: Secondary | ICD-10-CM

## 2011-11-21 DIAGNOSIS — I340 Nonrheumatic mitral (valve) insufficiency: Secondary | ICD-10-CM

## 2011-11-21 DIAGNOSIS — I079 Rheumatic tricuspid valve disease, unspecified: Secondary | ICD-10-CM

## 2011-11-21 NOTE — Assessment & Plan Note (Signed)
On exam today I could not hear any carotid bruits. The patient also had Dopplers done which showed less than 50% stenosis bilaterally she will need to continue risk factor modification including therapeutic lifestyle changes, continue aspirin and statin therapy.

## 2011-11-21 NOTE — Progress Notes (Signed)
Peyton Bottoms, MD, [facility] ABIM Board Certified in Adult Cardiovascular Medicine,Internal Medicine and Critical Care Medicine    CC: Routine followup  of a patient with valvular heart disease and coronary artery disease  HPI:  The patient is a 77 year old female with history of coronary disease, status post drug-eluting stent placement to the mid LAD in March of 2011. She has diabetes mellitus, hypercholesterolemia and hypertension. She also has valvular heart disease with moderate mitral regurgitation moderate tricuspid regurgitation. She denies any shortness of breath at rest. She has mild dyspnea on exertion. She denies orthopnea PND palpitations or syncope. She recently had a skin cancer removed from her for head but did not stop Plavix. She states that for a few days have significant bleeding and then stop Plavix for about 4-5 days. She reports occasional weak spells and some shortness of breath but no other cardiovascular-related symptoms. She is in NYHA class IIb.     PMH: reviewed and listed in Problem List in Electronic Records (and see below) Past Medical History  Diagnosis Date  . Respiratory distress   . Chronic renal insufficiency   . Hypertension   . Hypokalemia   . CHF (congestive heart failure)   . DM (diabetes mellitus)   . Myocardial infarction 2011    Non ST elevated MI  . Shortness of breath   . Cancer     skin cancer   Past Surgical History  Procedure Date  . Total abdominal hysterectomy   . Cholecystectomy   . Cardiac catheterization     with stent 2011  . Coronary stent placement 2011  . Cataract extraction w/phaco 08/29/2011    Procedure: CATARACT EXTRACTION PHACO AND INTRAOCULAR LENS PLACEMENT (IOC);  Surgeon: Gemma Payor;  Location: [facility];  Service: Ophthalmology;  Laterality: Right;  CDE: 10.25  . Eye surgery Sept. 2012    right KPE w/ IOL  . Cataract extraction w/phaco 09/08/2011    Procedure: CATARACT EXTRACTION PHACO AND INTRAOCULAR LENS PLACEMENT  (IOC);  Surgeon: Gemma Payor;  Location: [facility];  Service: Ophthalmology;  Laterality: Left;  CDE: 10.77      Allergies/SH/FHX : available in Electronic Records for review and reviewed  Medications: Current Outpatient Prescriptions  Medication Sig Dispense Refill  . acetaminophen (TYLENOL) 325 MG tablet Take 650 mg by mouth every 6 (six) hours as needed.        Marland Kitchen aliskiren (TEKTURNA) 300 MG tablet Take 300 mg by mouth daily.        Marland Kitchen amLODipine (NORVASC) 10 MG tablet Take 1 tablet (10 mg total) by mouth daily.  30 tablet  6  . aspirin EC 81 MG tablet Take 1 tablet (81 mg total) by mouth daily.  150 tablet  2  . benazepril (LOTENSIN) 20 MG tablet Take 20 mg by mouth daily.        . chlorthalidone (HYGROTON) 25 MG tablet Take 25 mg by mouth 2 (two) times daily.        . Cholecalciferol (VITAMIN D) 2000 UNITS CAPS Take 1 capsule by mouth daily.        . Choline Fenofibrate (TRILIPIX) 135 MG capsule Take 135 mg by mouth daily.        . clopidogrel (PLAVIX) 75 MG tablet Take 75 mg by mouth daily.       . furosemide (LASIX) 20 MG tablet Take 20 mg by mouth daily as needed. Swelling      . glimepiride (AMARYL) 2 MG tablet Take 2 mg by  mouth daily before breakfast.        . metFORMIN (GLUCOPHAGE) 1000 MG tablet Take 1,000 mg by mouth 2 (two) times daily with a meal.        . Nebivolol HCl (BYSTOLIC) 20 MG TABS Take 1 tablet (20 mg total) by mouth daily.  30 tablet  6  . nitroGLYCERIN (NITROSTAT) 0.4 MG SL tablet Place 1 tablet (0.4 mg total) under the tongue every 5 (five) minutes as needed.  25 tablet  3  . oxyCODONE-acetaminophen (PERCOCET) 5-325 MG per tablet Take 1 tablet by mouth every 4 (four) hours as needed.        . potassium chloride (KLOR-CON) 10 MEQ CR tablet Take 1 tablet (10 mEq total) by mouth daily.  30 tablet  6  . simvastatin (ZOCOR) 20 MG tablet Take 20 mg by mouth at bedtime.          ROS: No nausea or vomiting. No fever or chills.No melena or hematochezia.No bleeding.No  claudication  Physical Exam: BP 146/71  Pulse 56  Ht 5\' 3"  (1.6 m)  Wt 129 lb (58.514 kg)  BMI 22.85 kg/m2  SpO2 100% General: Well-nourished white female in no apparent distress. Neck: Normal carotid upstroke and no carotid bruits. No thyromegaly nonnodular thyroid. JVP is 5-6 cm Lungs: Clear breath sounds bilaterally without wheezing Cardiac: Regular rate and rhythm with normal S1-S2 no murmur rubs or gallops. I cannot hear the murmur of mitral regurgitation on this patient Vascular: No edema. Normal distal pulses bilaterally Skin: Skin cancer removed from her for head. Multiple keratotic lesions on the remainder of the scalp. Skin otherwise warm and dry  12lead ECG: Not obtained. Limited bedside ECHO:N/A   Assessment and Plan

## 2011-11-21 NOTE — Patient Instructions (Signed)
   Echo If the results of your test are normal or stable, you will receive a letter.  If they are abnormal, the nurse will contact you by phone. Your physician wants you to follow up in: 6 months.  You will receive a reminder letter in the mail one-two months in advance.  If you don't receive a letter, please call our office to schedule the follow up appointment  

## 2011-11-21 NOTE — Assessment & Plan Note (Signed)
No recurrent chest pain. Patient had a stent placed in 2011 which was a drug-eluting stent. She will remain on dual antiplatelet therapy.

## 2011-11-21 NOTE — Assessment & Plan Note (Signed)
Patient reports an increase in symptoms of shortness of breath. I cannot hear a murmur of mitral regurgitation exam. She has no overt heart failure symptoms but she needs a followup echocardiogram to further assess her mitral regurgitation as well as tricuspid regurgitation

## 2011-11-21 NOTE — Assessment & Plan Note (Signed)
Followed by the patient's primary care physician 

## 2011-12-01 ENCOUNTER — Other Ambulatory Visit (INDEPENDENT_AMBULATORY_CARE_PROVIDER_SITE_OTHER): Payer: Medicare Other | Admitting: *Deleted

## 2011-12-01 DIAGNOSIS — I059 Rheumatic mitral valve disease, unspecified: Secondary | ICD-10-CM

## 2011-12-01 DIAGNOSIS — I079 Rheumatic tricuspid valve disease, unspecified: Secondary | ICD-10-CM

## 2011-12-01 DIAGNOSIS — I2510 Atherosclerotic heart disease of native coronary artery without angina pectoris: Secondary | ICD-10-CM

## 2011-12-29 ENCOUNTER — Other Ambulatory Visit: Payer: Self-pay | Admitting: *Deleted

## 2011-12-29 MED ORDER — FUROSEMIDE 20 MG PO TABS: 20.0000 mg | ORAL_TABLET | Freq: Every day | ORAL | Status: DC | PRN

## 2012-02-09 ENCOUNTER — Other Ambulatory Visit: Payer: Self-pay | Admitting: *Deleted

## 2012-02-09 MED ORDER — POTASSIUM CHLORIDE ER 10 MEQ PO CPCR: 10.0000 meq | ORAL_CAPSULE | Freq: Every day | ORAL | Status: DC

## 2012-02-10 DIAGNOSIS — I5031 Acute diastolic (congestive) heart failure: Secondary | ICD-10-CM

## 2012-02-10 DIAGNOSIS — I509 Heart failure, unspecified: Secondary | ICD-10-CM

## 2012-02-20 ENCOUNTER — Encounter: Payer: Self-pay | Admitting: Cardiovascular Disease

## 2012-02-28 ENCOUNTER — Encounter: Payer: Self-pay | Admitting: *Deleted

## 2012-03-08 ENCOUNTER — Encounter: Payer: Medicare Other | Admitting: Cardiology

## 2012-03-08 ENCOUNTER — Encounter: Payer: Self-pay | Admitting: Physician Assistant

## 2012-03-08 ENCOUNTER — Ambulatory Visit (INDEPENDENT_AMBULATORY_CARE_PROVIDER_SITE_OTHER): Payer: Medicare Other | Admitting: Physician Assistant

## 2012-03-08 VITALS — BP 144/61 | HR 47 | Ht 61.0 in | Wt 131.2 lb

## 2012-03-08 DIAGNOSIS — N189 Chronic kidney disease, unspecified: Secondary | ICD-10-CM

## 2012-03-08 DIAGNOSIS — I509 Heart failure, unspecified: Secondary | ICD-10-CM

## 2012-03-08 DIAGNOSIS — I129 Hypertensive chronic kidney disease with stage 1 through stage 4 chronic kidney disease, or unspecified chronic kidney disease: Secondary | ICD-10-CM

## 2012-03-08 DIAGNOSIS — R0989 Other specified symptoms and signs involving the circulatory and respiratory systems: Secondary | ICD-10-CM

## 2012-03-08 DIAGNOSIS — I5032 Chronic diastolic (congestive) heart failure: Secondary | ICD-10-CM

## 2012-03-08 DIAGNOSIS — R0602 Shortness of breath: Secondary | ICD-10-CM

## 2012-03-08 MED ORDER — TORSEMIDE 20 MG PO TABS: 20.0000 mg | ORAL_TABLET | Freq: Two times a day (BID) | ORAL | Status: DC

## 2012-03-08 MED ORDER — POTASSIUM CHLORIDE ER 10 MEQ PO CPCR: 10.0000 meq | ORAL_CAPSULE | Freq: Two times a day (BID) | ORAL | Status: DC

## 2012-03-08 NOTE — Progress Notes (Addendum)
HPI: Post hospital followup from [facility], following recent referral to Dr. Kirke Corin for evaluation of acute/chronic DHF. Patient found to have moderate sized right pleural effusion treated, initially with IV Lasix. She was subsequently switched to [facility], prior to discharge. Patient had no complaint of CP; one set of cardiac markers was negative. She has known CAD, and plan was for an ischemic evaluation, following resolution of heart failure.   2-D echo indicated normal LVF (EF 60-65%), with severe diastolic dysfunction; moderate MR; severe TR/severe PHTN (63 mmHg).  Post hospital followup PCXR, on 3/18, suggested stable small/moderate right pleural effusion. BMET at same time indicated stable CKD, with BUN 32 creatinine 1.4 and potassium 4.4  Clinically, patient reports some mild improvement in dyspnea, since discharge. She denies PND, orthopnea, and suggests slight improvement in her chronic LE edema. She denies exertional CP, but has significant DOE.  Of note, at time of last OV, 6/12, with Dr. Andee Lineman, she was referred for ABIs: These were normal.  No Known Allergies  Current Outpatient Prescriptions  Medication Sig Dispense Refill  . acetaminophen (TYLENOL) 325 MG tablet Take 650 mg by mouth every 6 (six) hours as needed.        Marland Kitchen aliskiren (TEKTURNA) 300 MG tablet Take 300 mg by mouth daily.        Marland Kitchen amLODipine (NORVASC) 10 MG tablet Take 1 tablet (10 mg total) by mouth daily.  30 tablet  6  . aspirin EC 81 MG tablet Take 1 tablet (81 mg total) by mouth daily.  150 tablet  2  . Cholecalciferol (VITAMIN D) 2000 UNITS CAPS Take 1 capsule by mouth daily.        . Choline Fenofibrate (TRILIPIX) 135 MG capsule Take 135 mg by mouth daily.        . clopidogrel (PLAVIX) 75 MG tablet Take 75 mg by mouth daily.       Marland Kitchen glimepiride (AMARYL) 2 MG tablet Take 2 mg by mouth daily before breakfast.        . Nebivolol HCl (BYSTOLIC) 20 MG TABS Take 1 tablet (20 mg total) by mouth daily.  30 tablet  6  .  nitroGLYCERIN (NITROSTAT) 0.4 MG SL tablet Place 1 tablet (0.4 mg total) under the tongue every 5 (five) minutes as needed.  25 tablet  3  . oxyCODONE-acetaminophen (PERCOCET) 5-325 MG per tablet Take 1 tablet by mouth every 4 (four) hours as needed.        . potassium chloride (MICRO-K) 10 MEQ CR capsule Take 1 capsule (10 mEq total) by mouth daily.  30 capsule  6  . simvastatin (ZOCOR) 20 MG tablet Take 20 mg by mouth at bedtime.          Past Medical History  Diagnosis Date  . Respiratory distress   . Chronic renal insufficiency   . Hypertension   . Hypokalemia   . CHF (congestive heart failure)   . DM (diabetes mellitus)   . Myocardial infarction 2011    Non ST elevated MI  . Shortness of breath   . Cancer     skin cancer    Past Surgical History  Procedure Date  . Total abdominal hysterectomy   . Cholecystectomy   . Cardiac catheterization     with stent 2011  . Coronary stent placement 2011  . Cataract extraction w/phaco 08/29/2011    Procedure: CATARACT EXTRACTION PHACO AND INTRAOCULAR LENS PLACEMENT (IOC);  Surgeon: Gemma Payor;  Location: [facility];  Service: Ophthalmology;  Laterality:  Right;  CDE: 10.25  . Eye surgery Sept. 2012    right KPE w/ IOL  . Cataract extraction w/phaco 09/08/2011    Procedure: CATARACT EXTRACTION PHACO AND INTRAOCULAR LENS PLACEMENT (IOC);  Surgeon: Gemma Payor;  Location: [facility];  Service: Ophthalmology;  Laterality: Left;  CDE: 10.77    History   Social History  . Marital Status: Married    Spouse Name: N/A    Number of Children: N/A  . Years of Education: N/A   Occupational History  . Retired    Social History Main Topics  . Smoking status: Never Smoker   . Smokeless tobacco: Never Used  . Alcohol Use: No  . Drug Use: No  . Sexually Active: Not on file   Other Topics Concern  . Not on file   Social History Narrative  . No narrative on file    Family History  Problem Relation Age of Onset  . Coronary artery disease Neg Hx    . Anesthesia problems Neg Hx   . Hypotension Neg Hx   . Malignant hyperthermia Neg Hx   . Pseudochol deficiency Neg Hx     ROS: no nausea, vomiting; no fever, chills; no melena, hematochezia; no claudication  PHYSICAL EXAM: BP 144/61  Pulse 47  Ht 5\' 1"  (1.549 m)  Wt 131 lb 4 oz (59.535 kg)  BMI 24.80 kg/m2 GENERAL: 78 year old female, sitting upright; NAD HEENT: NCAT, PERRLA, EOMI; sclera clear; no xanthelasma NECK: palpable bilateral carotid pulses, left carotid bruit; JVD noted on the right LUNGS: Diminished breath sounds in right base; no crackles/wheezes CARDIAC: RRR (S1, S2); no significant murmurs; no rubs or gallops ABDOMEN: soft, non-tender; intact BS EXTREMETIES: 2-3+ bilateral pitting peripheral edema SKIN: warm/dry; no obvious rash/lesions MUSCULOSKELETAL: no joint deformity NEURO: no focal deficit; NL affect   EKG:    ASSESSMENT & PLAN:

## 2012-03-08 NOTE — Assessment & Plan Note (Signed)
We'll order carotid Dopplers for evaluation of left carotid bruit. Last study, 2011: 50-60% RICA, less than 50% on left.

## 2012-03-08 NOTE — Assessment & Plan Note (Signed)
Stable on current regimen   

## 2012-03-08 NOTE — Assessment & Plan Note (Signed)
Stable by recent labs, with creatinine 1.4. We'll continue to monitor closely on Demadex and ACE inhibitor.

## 2012-03-08 NOTE — Assessment & Plan Note (Signed)
Will repeat CXR to see if there is any improvement in the right pleural effusion. She may need a thoracentesis. Will also increase Demadex to 20 twice a day, for more aggressive diuresis. She has gained 7 pounds, since last OV, 6/12. We'll check followup labs, including BNP level, today, with repeat metabolic profile in one week. We'll plan early clinic followup with myself/Dr. DeGent in one month. Ultimately, plan is for an ischemic evaluation, following resolution of her pleural effusion.

## 2012-03-08 NOTE — Patient Instructions (Signed)
Your physician recommends that you have chest x-ray & labs today (BMET & BNP) Increase Demadex to 20mg  twice a day  Increase K-Dur to twice a day  Your physician recommends that you go to the [facility] for lab work in 7-10 days for repeat BMET Carotid Doppler If the results of your test are normal or stable, you will receive a letter.  If they are abnormal, the nurse will contact you by phone. Follow up in  1 month

## 2012-03-09 ENCOUNTER — Telehealth: Payer: Self-pay | Admitting: *Deleted

## 2012-03-09 DIAGNOSIS — R0602 Shortness of breath: Secondary | ICD-10-CM

## 2012-03-09 NOTE — Telephone Encounter (Signed)
Message copied by Arlyss Gandy on Fri Mar 09, 2012  3:21 PM ------      Message from: Rande Brunt      Created: Fri Mar 09, 2012  3:05 PM       Creatinine up slightly to 1.7. Repeat BMET/BNP next week, Wed 4/10.

## 2012-03-09 NOTE — Telephone Encounter (Signed)
Pt's dgt notified of lab and chest x-ray results and verbalized understanding.

## 2012-03-13 ENCOUNTER — Telehealth: Payer: Self-pay | Admitting: *Deleted

## 2012-03-13 NOTE — Telephone Encounter (Signed)
Message copied by Arlyss Gandy on Tue Mar 13, 2012  4:50 PM ------      Message from: Rande Brunt      Created: Fri Mar 09, 2012  3:05 PM       Creatinine up slightly to 1.7. Repeat BMET/BNP next week, Wed 4/10.

## 2012-03-13 NOTE — Telephone Encounter (Signed)
Pt states she is having labs repeated tomorrow as planned.

## 2012-03-20 ENCOUNTER — Telehealth: Payer: Self-pay | Admitting: *Deleted

## 2012-03-20 DIAGNOSIS — R7989 Other specified abnormal findings of blood chemistry: Secondary | ICD-10-CM

## 2012-03-20 DIAGNOSIS — R0602 Shortness of breath: Secondary | ICD-10-CM

## 2012-03-20 DIAGNOSIS — Z79899 Other long term (current) drug therapy: Secondary | ICD-10-CM

## 2012-03-20 NOTE — Telephone Encounter (Signed)
Notes Recorded by Lesle Chris, LPN on 1/61/0960 at 10:13 AM Daughter Robin [name]) notified of below. Will fax order to [facility]. Will be able to take her on Friday, 4/19.

## 2012-03-20 NOTE — Telephone Encounter (Signed)
Message copied by Murriel Hopper on Tue Mar 20, 2012 10:17 AM ------      Message from: Prescott Parma C      Created: Mon Mar 19, 2012  4:19 PM       Thanks for clarification. Then let's check BMET this Thurs (4/18), o/w as planned.

## 2012-03-21 ENCOUNTER — Encounter (INDEPENDENT_AMBULATORY_CARE_PROVIDER_SITE_OTHER): Payer: Medicare Other

## 2012-03-21 DIAGNOSIS — I6529 Occlusion and stenosis of unspecified carotid artery: Secondary | ICD-10-CM

## 2012-03-21 DIAGNOSIS — R0989 Other specified symptoms and signs involving the circulatory and respiratory systems: Secondary | ICD-10-CM

## 2012-03-27 ENCOUNTER — Telehealth: Payer: Self-pay | Admitting: *Deleted

## 2012-03-27 NOTE — Telephone Encounter (Signed)
[name] notified and verbalized understanding. 

## 2012-03-27 NOTE — Telephone Encounter (Signed)
Message copied by Arlyss Gandy on Tue Mar 27, 2012 10:51 AM ------      Message from: Rande Brunt      Created: Mon Mar 26, 2012  4:29 PM       Creatinine improved at 1.7, but BNP trending up from 620 to 690. Will reassess clinic status at f/u OV with GD.

## 2012-03-27 NOTE — Telephone Encounter (Signed)
Left message to call back on voicemail regarding lab and carotid results.

## 2012-03-27 NOTE — Telephone Encounter (Signed)
Message copied by Arlyss Gandy on Tue Mar 27, 2012 10:52 AM ------      Message from: Rande Brunt      Created: Mon Mar 26, 2012  4:30 PM       Mild/mod bilateral disease. F/u in 1 year.

## 2012-04-13 ENCOUNTER — Ambulatory Visit (INDEPENDENT_AMBULATORY_CARE_PROVIDER_SITE_OTHER): Payer: Medicare Other | Admitting: Physician Assistant

## 2012-04-13 ENCOUNTER — Encounter: Payer: Self-pay | Admitting: Physician Assistant

## 2012-04-13 VITALS — BP 148/56 | HR 46 | Ht 61.0 in | Wt 135.0 lb

## 2012-04-13 DIAGNOSIS — I2789 Other specified pulmonary heart diseases: Secondary | ICD-10-CM

## 2012-04-13 DIAGNOSIS — I2510 Atherosclerotic heart disease of native coronary artery without angina pectoris: Secondary | ICD-10-CM

## 2012-04-13 DIAGNOSIS — N189 Chronic kidney disease, unspecified: Secondary | ICD-10-CM

## 2012-04-13 DIAGNOSIS — I5032 Chronic diastolic (congestive) heart failure: Secondary | ICD-10-CM

## 2012-04-13 DIAGNOSIS — I272 Pulmonary hypertension, unspecified: Secondary | ICD-10-CM

## 2012-04-13 DIAGNOSIS — J90 Pleural effusion, not elsewhere classified: Secondary | ICD-10-CM

## 2012-04-13 DIAGNOSIS — R0602 Shortness of breath: Secondary | ICD-10-CM

## 2012-04-13 DIAGNOSIS — I509 Heart failure, unspecified: Secondary | ICD-10-CM

## 2012-04-13 MED ORDER — NEBIVOLOL HCL 10 MG PO TABS: 10.0000 mg | ORAL_TABLET | Freq: Every day | ORAL | Status: DC

## 2012-04-13 MED ORDER — TORSEMIDE 20 MG PO TABS: 30.0000 mg | ORAL_TABLET | Freq: Two times a day (BID) | ORAL | Status: DC

## 2012-04-13 NOTE — Assessment & Plan Note (Signed)
Will refer patient to Dr. Cherie Ouch for consideration of possible right lung thoracentesis, for treatment of persistent right pleural effusion. Recent followup CXR one month ago suggested no significant change, with persistent moderate-sized R. pleural effusion. This despite increasing diuretic regimen. In the meanwhile, we'll further uptitrate Demadex to 30 twice a day, given weight gain of 4 pounds, and worsening LE edema. Will check followup BMET/BNP level in one week. We'll reassess clinical status in one month.

## 2012-04-13 NOTE — Assessment & Plan Note (Signed)
Assessed as severe (63 mmHg), by recent 2-D echo. We'll defer to Dr. Cherie Ouch for further recommendations.

## 2012-04-13 NOTE — Progress Notes (Signed)
HPI: Patient presents for early scheduled followup.  When last seen, I increased Demadex dose for more aggressive diuresis, following reported weight gain of 7 pounds. A repeat CXR suggested no significant change, with persistent moderate R pleural effusion.  Followup labs: Improved creatinine 1.7 (2.0), BUN 52, potassium 4.6. Slightly increased BNP 700 (600).  Patient presents reporting progressive weakness over the past 2-3 days, moderate DOE with walking, but denies PND, orthopnea, CP, tachycardia palpitations. She also notes worsening LE edema. Denies falls/LOC.  EKG today, reviewed by me, reveals an atrial fibrillation at 44 bpm, newly documented.   No Known Allergies  Current Outpatient Prescriptions  Medication Sig Dispense Refill  . acetaminophen (TYLENOL) 325 MG tablet Take 650 mg by mouth every 6 (six) hours as needed.        Marland Kitchen amLODipine (NORVASC) 10 MG tablet Take 1 tablet (10 mg total) by mouth daily.  30 tablet  6  . aspirin EC 81 MG tablet Take 1 tablet (81 mg total) by mouth daily.  150 tablet  2  . cholecalciferol (VITAMIN D) 1000 UNITS tablet Take 1,000 Units by mouth daily.      . Choline Fenofibrate (TRILIPIX) 135 MG capsule Take 135 mg by mouth daily.        . clopidogrel (PLAVIX) 75 MG tablet Take 75 mg by mouth daily.       Marland Kitchen glimepiride (AMARYL) 2 MG tablet Take 2 mg by mouth daily before breakfast.        . nitroGLYCERIN (NITROSTAT) 0.4 MG SL tablet Place 1 tablet (0.4 mg total) under the tongue every 5 (five) minutes as needed.  25 tablet  3  . potassium chloride (MICRO-K) 10 MEQ CR capsule Take 1 capsule (10 mEq total) by mouth 2 (two) times daily.  60 capsule  6  . simvastatin (ZOCOR) 20 MG tablet Take 20 mg by mouth at bedtime.        . torsemide (DEMADEX) 20 MG tablet Take 1.5 tablets (30 mg total) by mouth 2 (two) times daily.  90 tablet  6  . DISCONTD: Nebivolol HCl (BYSTOLIC) 20 MG TABS Take 1 tablet (20 mg total) by mouth daily.  30 tablet  6  . DISCONTD:  torsemide (DEMADEX) 20 MG tablet Take 1 tablet (20 mg total) by mouth 2 (two) times daily.  60 tablet  6  . nebivolol (BYSTOLIC) 10 MG tablet Take 1 tablet (10 mg total) by mouth daily.  30 tablet  6    Past Medical History  Diagnosis Date  . Respiratory distress   . Chronic renal insufficiency   . Hypertension   . Hypokalemia   . CHF (congestive heart failure)   . DM (diabetes mellitus)   . Myocardial infarction 2011    Non ST elevated MI  . Shortness of breath   . Cancer     skin cancer    Past Surgical History  Procedure Date  . Total abdominal hysterectomy   . Cholecystectomy   . Cardiac catheterization     with stent 2011  . Coronary stent placement 2011  . Cataract extraction w/phaco 08/29/2011    Procedure: CATARACT EXTRACTION PHACO AND INTRAOCULAR LENS PLACEMENT (IOC);  Surgeon: Gemma Payor;  Location: [facility];  Service: Ophthalmology;  Laterality: Right;  CDE: 10.25  . Eye surgery Sept. 2012    right KPE w/ IOL  . Cataract extraction w/phaco 09/08/2011    Procedure: CATARACT EXTRACTION PHACO AND INTRAOCULAR LENS PLACEMENT (IOC);  Surgeon: Gemma Payor;  Location: [facility];  Service: Ophthalmology;  Laterality: Left;  CDE: 10.77    History   Social History  . Marital Status: Married    Spouse Name: N/A    Number of Children: N/A  . Years of Education: N/A   Occupational History  . Retired    Social History Main Topics  . Smoking status: Never Smoker   . Smokeless tobacco: Never Used  . Alcohol Use: No  . Drug Use: No  . Sexually Active: Not on file   Other Topics Concern  . Not on file   Social History Narrative  . No narrative on file   Social History Narrative  . No narrative on file    Problem Relation Age of Onset  . Coronary artery disease Neg Hx   . Anesthesia problems Neg Hx   . Hypotension Neg Hx   . Malignant hyperthermia Neg Hx   . Pseudochol deficiency Neg Hx     ROS: no nausea, vomiting; no fever, chills; no melena, hematochezia; no  claudication  PHYSICAL EXAM: BP 148/56  Pulse 46  Ht 5\' 1"  (1.549 m)  Wt 135 lb (61.236 kg)  BMI 25.51 kg/m2  SpO2 98% GENERAL: 78 year old female, sitting upright; NAD  HEENT: NCAT, PERRLA, EOMI; sclera clear; no xanthelasma  NECK: JVD noted on the right  LUNGS: Diminished breath sounds in right base; no crackles/wheezes  CARDIAC: RRR (S1, S2); no significant murmurs; no rubs or gallops  ABDOMEN: soft, non-tender; intact BS  EXTREMETIES: 2-3+ bilateral pitting peripheral edema  SKIN: warm/dry; no obvious rash/lesions  MUSCULOSKELETAL: no joint deformity  NEURO: no focal deficit; NL affect   EKG: reviewed and available in Electronic Records   ASSESSMENT & PLAN:  Atrial fibrillation Patient presents with newly documented PAF with SVR in the 40 bpm range. I have assessed her with a CHADS score of 3 (HTN, DM, age). Will decrease Bystolic by half dose, after holding today. Regarding long-term Coumadin anticoagulation, we'll postpone this decision for now. Patient may need a right lung thoracentesis, given persistent moderate-sized pleural effusion, by recent followup CXR. She has been on low-dose ASA/Plavix regimen for several years, which we will continue for now. Suspect she can safely come off Plavix in near future, if we decide to initiate Coumadin anticoagulation. We'll readdress this issue at next OV with me/Dr. Degent in one month.  Chronic diastolic heart failure Will refer patient to Dr. Cherie Ouch for consideration of possible right lung thoracentesis, for treatment of persistent right pleural effusion. Recent followup CXR one month ago suggested no significant change, with persistent moderate-sized R. pleural effusion. This despite increasing diuretic regimen. In the meanwhile, we'll further uptitrate Demadex to 30 twice a day, given weight gain of 4 pounds, and worsening LE edema. Will check followup BMET/BNP level in one week. We'll reassess clinical status in one  month.  CHRONIC KIDNEY DISEASE UNSPECIFIED Recent followup labs indicated stable CKD, with improved creatinine of 1.7 (2.0). We'll continue to monitor closely, given plans to increase Demadex dose today.  Pulmonary hypertension Assessed as severe (63 mmHg), by recent 2-D echo. We'll defer to Dr. Cherie Ouch for further recommendations.     Gene Malena Timpone, PAC

## 2012-04-13 NOTE — Assessment & Plan Note (Signed)
Recent followup labs indicated stable CKD, with improved creatinine of 1.7 (2.0). We'll continue to monitor closely, given plans to increase Demadex dose today.

## 2012-04-13 NOTE — Patient Instructions (Addendum)
Follow up in 1 month with Gene or Dr. Earnestine Leys. Decrease Bystolic to 10 mg daily. Do not take tonight's dose. Increase Demadex to 30 mg two times a day. Your physician recommends that you go to outpatient registration at [facility] for lab work: BMET/BNP. DO LABS IN 1 WEEK. Referral to Dr. Orson Aloe (pulmonary doctor).

## 2012-04-13 NOTE — Assessment & Plan Note (Signed)
Patient presents with newly documented PAF with SVR in the 40 bpm range. I have assessed her with a CHADS score of 3 (HTN, DM, age). Will decrease Bystolic by half dose, after holding today. Regarding long-term Coumadin anticoagulation, we'll postpone this decision for now. Patient may need a right lung thoracentesis, given persistent moderate-sized pleural effusion, by recent followup CXR. She has been on low-dose ASA/Plavix regimen for several years, which we will continue for now. Suspect she can safely come off Plavix in near future, if we decide to initiate Coumadin anticoagulation. We'll readdress this issue at next OV with me/Dr. Degent in one month.

## 2012-05-02 ENCOUNTER — Telehealth: Payer: Self-pay | Admitting: *Deleted

## 2012-05-02 NOTE — Telephone Encounter (Signed)
Message copied by Murriel Hopper on Wed May 02, 2012 11:22 AM ------      Message from: Robin [name]      Created: Mon Apr 23, 2012  4:50 PM       Creatinine up slightly to 1.8. K NL. BNP unchanged at 700. Pt recently referred to Dr Orson Aloe for possible thoracentesis. Continue current medication regimen.

## 2012-05-02 NOTE — Telephone Encounter (Signed)
Left message to return call 

## 2012-05-03 NOTE — Telephone Encounter (Signed)
Notes Recorded by Lesle Chris, LPN on 1/61/0960 at 5:32 PM Daughter Eunice Blase) notified of below. States she sees Dr. Orson Aloe tomorrow

## 2012-05-14 ENCOUNTER — Other Ambulatory Visit: Payer: Self-pay

## 2012-05-17 ENCOUNTER — Encounter: Payer: Self-pay | Admitting: Physician Assistant

## 2012-05-17 ENCOUNTER — Ambulatory Visit (INDEPENDENT_AMBULATORY_CARE_PROVIDER_SITE_OTHER): Payer: Medicare Other | Admitting: Physician Assistant

## 2012-05-17 VITALS — BP 132/63 | HR 51 | Resp 99 | Ht 61.0 in | Wt 131.0 lb

## 2012-05-17 DIAGNOSIS — I2789 Other specified pulmonary heart diseases: Secondary | ICD-10-CM

## 2012-05-17 DIAGNOSIS — I272 Pulmonary hypertension, unspecified: Secondary | ICD-10-CM

## 2012-05-17 DIAGNOSIS — I5032 Chronic diastolic (congestive) heart failure: Secondary | ICD-10-CM

## 2012-05-17 DIAGNOSIS — I4891 Unspecified atrial fibrillation: Secondary | ICD-10-CM | POA: Insufficient documentation

## 2012-05-17 MED ORDER — NEBIVOLOL HCL 5 MG PO TABS: 5.0000 mg | ORAL_TABLET | Freq: Every day | ORAL | Status: DC

## 2012-05-17 NOTE — Assessment & Plan Note (Addendum)
Euvolemic by history and exam. Status post recent right thoracentesis for persistent pleural effusion. Patient has lost 4 lbs since last OV. Continue current dose of Demadex, recently further increased for management of bilateral LE edema. Recent followup labs indicated stable CKD, normal potassium.

## 2012-05-17 NOTE — Patient Instructions (Addendum)
Continue all current medications.  Decrease Bystolic to 5mg  daily Follow up in  1 month

## 2012-05-17 NOTE — Progress Notes (Signed)
HPI: Patient presents for early scheduled followup.  Since her last patient OV, patient underwent successful right sided thoracentesis, per Dr. Cherie Ouch (details currently unavailable). Followup CXR was negative for PTX. She will be following up with Dr. Orson Aloe in the next few weeks. She reports improvement in her overall energy level, as well as level of dyspnea. She denies PND, orthopnea. She continues to have significant bilateral LE edema, with probable cellulitis. Demadex dose was recently further uptitrate, by Dr. Daphine Deutscher, after I increased the dose to 30 twice a day. Her weight is down 4 pounds.   Recent followup labs, June 10: Potassium 4.8, BUN 49, creatinine 2.1 (GFR 22). Normal TSH. LDL 34.   No Known Allergies  Current Outpatient Prescriptions  Medication Sig Dispense Refill  . acetaminophen (TYLENOL) 325 MG tablet Take 650 mg by mouth every 6 (six) hours as needed.        Marland Kitchen amLODipine (NORVASC) 10 MG tablet Take 1 tablet (10 mg total) by mouth daily.  30 tablet  6  . aspirin EC 81 MG tablet Take 1 tablet (81 mg total) by mouth daily.  150 tablet  2  . cholecalciferol (VITAMIN D) 1000 UNITS tablet Take 1,000 Units by mouth daily.      . Choline Fenofibrate (TRILIPIX) 135 MG capsule Take 135 mg by mouth daily.        . ciprofloxacin (CIPRO) 500 MG tablet Take 500 mg by mouth 2 (two) times daily.      . clopidogrel (PLAVIX) 75 MG tablet Take 75 mg by mouth daily.       Marland Kitchen glimepiride (AMARYL) 2 MG tablet Take 2 mg by mouth daily before breakfast.        . nebivolol (BYSTOLIC) 5 MG tablet Take 1 tablet (5 mg total) by mouth daily.  30 tablet  6  . nitroGLYCERIN (NITROSTAT) 0.4 MG SL tablet Place 1 tablet (0.4 mg total) under the tongue every 5 (five) minutes as needed.  25 tablet  3  . potassium chloride (MICRO-K) 10 MEQ CR capsule Take 1 capsule (10 mEq total) by mouth 2 (two) times daily.  60 capsule  6  . simvastatin (ZOCOR) 20 MG tablet Take 20 mg by mouth at bedtime.         . torsemide (DEMADEX) 20 MG tablet Take 40 mg by mouth 2 (two) times daily.      . traMADol (ULTRAM) 50 MG tablet Take 50-75 mg by mouth every 6 (six) hours as needed.      Marland Kitchen DISCONTD: nebivolol (BYSTOLIC) 10 MG tablet Take 1 tablet (10 mg total) by mouth daily.  30 tablet  6  . DISCONTD: torsemide (DEMADEX) 20 MG tablet Take 1.5 tablets (30 mg total) by mouth 2 (two) times daily.  90 tablet  6    Past Medical History  Diagnosis Date  . Respiratory distress   . Chronic renal insufficiency   . Hypertension   . Hypokalemia   . CHF (congestive heart failure)   . DM (diabetes mellitus)   . Myocardial infarction 2011    Non ST elevated MI  . Shortness of breath   . Cancer     skin cancer    Past Surgical History  Procedure Date  . Total abdominal hysterectomy   . Cholecystectomy   . Cardiac catheterization     with stent 2011  . Coronary stent placement 2011  . Cataract extraction w/phaco 08/29/2011    Procedure: CATARACT EXTRACTION PHACO AND INTRAOCULAR LENS  PLACEMENT (IOC);  Surgeon: Gemma Payor;  Location: [facility];  Service: Ophthalmology;  Laterality: Right;  CDE: 10.25  . Eye surgery Sept. 2012    right KPE w/ IOL  . Cataract extraction w/phaco 09/08/2011    Procedure: CATARACT EXTRACTION PHACO AND INTRAOCULAR LENS PLACEMENT (IOC);  Surgeon: Gemma Payor;  Location: [facility];  Service: Ophthalmology;  Laterality: Left;  CDE: 10.77    History   Social History  . Marital Status: Married    Spouse Name: N/A    Number of Children: N/A  . Years of Education: N/A   Occupational History  . Retired    Social History Main Topics  . Smoking status: Never Smoker   . Smokeless tobacco: Never Used  . Alcohol Use: No  . Drug Use: No  . Sexually Active: Not on file   Other Topics Concern  . Not on file   Social History Narrative  . No narrative on file    Family History  Problem Relation Age of Onset  . Coronary artery disease Neg Hx   . Anesthesia problems Neg Hx   .  Hypotension Neg Hx   . Malignant hyperthermia Neg Hx   . Pseudochol deficiency Neg Hx     ROS: no nausea, vomiting; no fever, chills; no melena, hematochezia; no claudication  PHYSICAL EXAM: BP 132/63  Pulse 51  Resp 99  Ht 5\' 1"  (1.549 m)  Wt 131 lb (59.421 kg)  BMI 24.75 kg/m2  SpO2 96% GENERAL: 78 year old female, sitting upright; NAD  HEENT: NCAT, PERRLA, EOMI; sclera clear; no xanthelasma  NECK: JVD noted on the right  LUNGS: Clear to auscultation bilaterally  CARDIAC: Irregularly irregular (S1, S2); no significant murmurs; no rubs or gallops  ABDOMEN: soft, non-tender; intact BS  EXTREMETIES: 4+ bilateral pitting peripheral edema, diffuse erythema  SKIN: warm/dry; no obvious rash/lesions  MUSCULOSKELETAL: no joint deformity  NEURO: no focal deficit; NL aff   EKG: reviewed and available in Electronic Records   ASSESSMENT & PLAN:  Atrial fibrillation Will further reduce Bystolic to 5 mg daily, given persistent slow HR. When last seen, I cut her Bystolic does by one half. If she demonstrates significant bradycardia at time of next OV, and I may have to DC beta blocker altogether. However, she has not on any other rate controlling agents. When she presented to [place] last month with new onset AF, it is was with SVR (44 bpm), and not tachycardia. Regarding the issue of anticoagulation, her daughter would like to consider this further, and we will make a decision at time of next OV in 1 month. In doing so, we will transition her off ASA/Plavix, once she obtains therapeutic INR levels.  Chronic diastolic heart failure Euvolemic by history and exam. Status post recent right thoracentesis for persistent pleural effusion. Patient has lost 4 lbs since last OV. Continue current dose of Demadex, recently further increased for management of bilateral LE edema. Recent followup labs indicated stable CKD, normal potassium.   Pulmonary hypertension Followed by Dr. Sonia Baller, PAC

## 2012-05-17 NOTE — Assessment & Plan Note (Signed)
Will further reduce Bystolic to 5 mg daily, given persistent slow HR. When last seen, I cut her Bystolic does by one half. If she demonstrates significant bradycardia at time of next OV, and I may have to DC beta blocker altogether. However, she has not on any other rate controlling agents. When she presented to [place] last month with new onset AF, it is was with SVR (44 bpm), and not tachycardia. Regarding the issue of anticoagulation, her daughter would like to consider this further, and we will make a decision at time of next OV in 1 month. In doing so, we will transition her off ASA/Plavix, once she obtains therapeutic INR levels.

## 2012-05-17 NOTE — Assessment & Plan Note (Signed)
Followed by Dr. William Henderson 

## 2012-06-22 ENCOUNTER — Ambulatory Visit (INDEPENDENT_AMBULATORY_CARE_PROVIDER_SITE_OTHER): Payer: Medicare Other | Admitting: Physician Assistant

## 2012-06-22 ENCOUNTER — Encounter: Payer: Self-pay | Admitting: Physician Assistant

## 2012-06-22 VITALS — BP 144/61 | HR 57 | Ht 61.0 in | Wt 116.0 lb

## 2012-06-22 DIAGNOSIS — N189 Chronic kidney disease, unspecified: Secondary | ICD-10-CM

## 2012-06-22 DIAGNOSIS — I5033 Acute on chronic diastolic (congestive) heart failure: Secondary | ICD-10-CM

## 2012-06-22 DIAGNOSIS — R600 Localized edema: Secondary | ICD-10-CM | POA: Insufficient documentation

## 2012-06-22 DIAGNOSIS — R609 Edema, unspecified: Secondary | ICD-10-CM

## 2012-06-22 DIAGNOSIS — I4891 Unspecified atrial fibrillation: Secondary | ICD-10-CM

## 2012-06-22 DIAGNOSIS — J90 Pleural effusion, not elsewhere classified: Secondary | ICD-10-CM

## 2012-06-22 DIAGNOSIS — R0602 Shortness of breath: Secondary | ICD-10-CM

## 2012-06-22 DIAGNOSIS — I5032 Chronic diastolic (congestive) heart failure: Secondary | ICD-10-CM

## 2012-06-22 NOTE — Assessment & Plan Note (Signed)
Patient reports clinical improvement, following recent right-sided thoracentesis, by Dr. Orson Aloe.

## 2012-06-22 NOTE — Progress Notes (Signed)
Primary Cardiologist: Lewayne Bunting, MD   HPI: Patient presents for scheduled followup.  She reports significant increase in energy, improved exercise tolerance, and much less SOB and LE edema. She feels "so much better", and has actually lost 15 pounds since last OV. She has not had any followup labs. She remains on low-dose ASA/Plavix therapy, and denies any overt bleeding.  12-lead EKG today indicates sinus bradycardia 56 bpm.  No Known Allergies  Current Outpatient Prescriptions  Medication Sig Dispense Refill  . acetaminophen (TYLENOL) 325 MG tablet Take 650 mg by mouth every 6 (six) hours as needed.        Marland Kitchen amLODipine (NORVASC) 10 MG tablet Take 1 tablet (10 mg total) by mouth daily.  30 tablet  6  . cholecalciferol (VITAMIN D) 1000 UNITS tablet Take 1,000 Units by mouth daily.      . Choline Fenofibrate (TRILIPIX) 135 MG capsule Take 135 mg by mouth daily.        . clopidogrel (PLAVIX) 75 MG tablet Take 75 mg by mouth daily.       Marland Kitchen glimepiride (AMARYL) 2 MG tablet Take 2 mg by mouth daily before breakfast.        . nebivolol (BYSTOLIC) 5 MG tablet Take 1 tablet (5 mg total) by mouth daily.  30 tablet  6  . nitroGLYCERIN (NITROSTAT) 0.4 MG SL tablet Place 1 tablet (0.4 mg total) under the tongue every 5 (five) minutes as needed.  25 tablet  3  . potassium chloride (MICRO-K) 10 MEQ CR capsule Take 1 capsule (10 mEq total) by mouth 2 (two) times daily.  60 capsule  6  . simvastatin (ZOCOR) 20 MG tablet Take 20 mg by mouth at bedtime.        . torsemide (DEMADEX) 20 MG tablet Take 40 mg by mouth 2 (two) times daily.        Past Medical History  Diagnosis Date  . Respiratory distress   . Chronic renal insufficiency   . Hypertension   . Hypokalemia   . CHF (congestive heart failure)   . DM (diabetes mellitus)   . Myocardial infarction 2011    Non ST elevated MI  . Shortness of breath   . Cancer     skin cancer    Past Surgical History  Procedure Date  . Total abdominal  hysterectomy   . Cholecystectomy   . Cardiac catheterization     with stent 2011  . Coronary stent placement 2011  . Cataract extraction w/phaco 08/29/2011    Procedure: CATARACT EXTRACTION PHACO AND INTRAOCULAR LENS PLACEMENT (IOC);  Surgeon: Gemma Payor;  Location: [facility];  Service: Ophthalmology;  Laterality: Right;  CDE: 10.25  . Eye surgery Sept. 2012    right KPE w/ IOL  . Cataract extraction w/phaco 09/08/2011    Procedure: CATARACT EXTRACTION PHACO AND INTRAOCULAR LENS PLACEMENT (IOC);  Surgeon: Gemma Payor;  Location: [facility];  Service: Ophthalmology;  Laterality: Left;  CDE: 10.77    History   Social History  . Marital Status: Married    Spouse Name: N/A    Number of Children: N/A  . Years of Education: N/A   Occupational History  . Retired    Social History Main Topics  . Smoking status: Never Smoker   . Smokeless tobacco: Never Used  . Alcohol Use: No  . Drug Use: No  . Sexually Active: Not on file   Other Topics Concern  . Not on file   Social History Narrative  .  No narrative on file   Social History Narrative  . No narrative on file    Problem Relation Age of Onset  . Coronary artery disease Neg Hx   . Anesthesia problems Neg Hx   . Hypotension Neg Hx   . Malignant hyperthermia Neg Hx   . Pseudochol deficiency Neg Hx     ROS: no nausea, vomiting; no fever, chills; no melena, hematochezia; no claudication  PHYSICAL EXAM: BP 144/61  Pulse 57  Ht 5\' 1"  (1.549 m)  Wt 116 lb (52.617 kg)  BMI 21.92 kg/m2  SpO2 98% GENERAL: 78 year old female, sitting upright; NAD  HEENT: NCAT, PERRLA, EOMI; sclera clear; no xanthelasma  NECK: JVD noted on the right  LUNGS: Clear to auscultation bilaterally, with diminished breath sounds on the right  CARDIAC: Irregularly irregular (S1, S2); no significant murmurs; no rubs or gallops  ABDOMEN: soft, non-tender; intact BS  EXTREMETIES: 1-2+ bilateral pitting peripheral edema, diffuse erythema  SKIN: warm/dry; no  obvious rash/lesions  MUSCULOSKELETAL: no joint deformity  NEURO: no focal deficit; NL affect    EKG: reviewed and available in Electronic Records   ASSESSMENT & PLAN:  Chronic diastolic heart failure Patient has diuresed 18 pounds since last OV. Continue current dose of Demadex. Check followup labs, including BNP level.  Atrial fibrillation Maintaining SB on a beta blocker. Continue low-dose ASA/Plavix therapy. Patient and daughter disinclined to opt for Coumadin anticoagulation, at this point in time. They're quite pleased with her progress, at this point, and would prefer to not change her current regimen.  Peripheral edema Significantly improved on current dose of diuretic.  Pleural effusion Patient reports clinical improvement, following recent right-sided thoracentesis, by Dr. Orson Aloe.  CHRONIC KIDNEY DISEASE UNSPECIFIED Followup metabolic profile today    Gene Leoda Smithhart, PAC

## 2012-06-22 NOTE — Assessment & Plan Note (Signed)
Significantly improved on current dose of diuretic.

## 2012-06-22 NOTE — Assessment & Plan Note (Signed)
Patient has diuresed 18 pounds since last OV. Continue current dose of Demadex. Check followup labs, including BNP level.

## 2012-06-22 NOTE — Patient Instructions (Addendum)
   Labs today:  BMET & BNP  Office will contact with results Your physician wants you to follow up in: 6 months.  You will receive a reminder letter in the mail one-two months in advance.  If you don't receive a letter, please call our office to schedule the follow up appointment

## 2012-06-22 NOTE — Assessment & Plan Note (Signed)
Followup metabolic profile today

## 2012-06-22 NOTE — Assessment & Plan Note (Signed)
Maintaining SB on a beta blocker. Continue low-dose ASA/Plavix therapy. Patient and daughter disinclined to opt for Coumadin anticoagulation, at this point in time. They're quite pleased with her progress, at this point, and would prefer to not change her current regimen.

## 2012-06-26 ENCOUNTER — Telehealth: Payer: Self-pay | Admitting: *Deleted

## 2012-06-26 NOTE — Telephone Encounter (Signed)
Notes Recorded by Lesle Chris, LPN on 1/61/0960 at 10:35 AM Patient notified and verbalized understanding.

## 2012-06-26 NOTE — Telephone Encounter (Signed)
Message copied by Lesle Chris on Tue Jun 26, 2012 10:35 AM ------      Message from: Prescott Parma C      Created: Mon Jun 25, 2012  5:00 PM       Stable CKD, improved BNP level. Continue current medication regimen. Pt will need f/u labs at next OV.

## 2012-08-30 ENCOUNTER — Other Ambulatory Visit: Payer: Self-pay | Admitting: Cardiology

## 2012-08-30 MED ORDER — POTASSIUM CHLORIDE ER 10 MEQ PO CPCR: 10.0000 meq | ORAL_CAPSULE | Freq: Two times a day (BID) | ORAL | Status: DC

## 2012-10-25 ENCOUNTER — Other Ambulatory Visit: Payer: Self-pay | Admitting: Cardiology

## 2012-10-25 MED ORDER — TORSEMIDE 20 MG PO TABS: 40.0000 mg | ORAL_TABLET | Freq: Two times a day (BID) | ORAL | Status: DC

## 2012-12-12 ENCOUNTER — Encounter: Payer: Self-pay | Admitting: Physician Assistant

## 2012-12-12 ENCOUNTER — Ambulatory Visit (INDEPENDENT_AMBULATORY_CARE_PROVIDER_SITE_OTHER): Payer: Medicare Other | Admitting: Physician Assistant

## 2012-12-12 VITALS — BP 144/78 | HR 52 | Ht 63.5 in | Wt 123.0 lb

## 2012-12-12 DIAGNOSIS — I10 Essential (primary) hypertension: Secondary | ICD-10-CM

## 2012-12-12 DIAGNOSIS — I779 Disorder of arteries and arterioles, unspecified: Secondary | ICD-10-CM

## 2012-12-12 DIAGNOSIS — R0602 Shortness of breath: Secondary | ICD-10-CM

## 2012-12-12 DIAGNOSIS — I2510 Atherosclerotic heart disease of native coronary artery without angina pectoris: Secondary | ICD-10-CM | POA: Insufficient documentation

## 2012-12-12 DIAGNOSIS — I38 Endocarditis, valve unspecified: Secondary | ICD-10-CM | POA: Insufficient documentation

## 2012-12-12 DIAGNOSIS — N189 Chronic kidney disease, unspecified: Secondary | ICD-10-CM

## 2012-12-12 DIAGNOSIS — N184 Chronic kidney disease, stage 4 (severe): Secondary | ICD-10-CM | POA: Insufficient documentation

## 2012-12-12 DIAGNOSIS — I5032 Chronic diastolic (congestive) heart failure: Secondary | ICD-10-CM

## 2012-12-12 DIAGNOSIS — I4891 Unspecified atrial fibrillation: Secondary | ICD-10-CM

## 2012-12-12 NOTE — Assessment & Plan Note (Signed)
Patient has gained 7 pounds since last OV, continues to have mild residual peripheral edema, and has NYHA 2-3 symptoms. Will check followup labs, including BNP level, and then make further recommendations regarding her current diuretic regimen. Recommend early clinic followup with me in one month.

## 2012-12-12 NOTE — Assessment & Plan Note (Signed)
Will consider a followup echocardiogram at time of next OV.

## 2012-12-12 NOTE — Patient Instructions (Addendum)
   Labs today for BMET, BNP, TSH  Office will contact with results Continue all current medications. Follow up in  1 month

## 2012-12-12 NOTE — Assessment & Plan Note (Signed)
Quiescent on current medication regimen. 

## 2012-12-12 NOTE — Assessment & Plan Note (Signed)
Whereas she was in NSR at time of last OV, she now presents in apparent idiojunctional rhythm at approximately 50 bpm. Her daughter states that her pulse is very stable in the 50 bpm range. Patient denies any associated symptoms. Will continue same dose Bystolic for rate control and ASA/Plavix therapy for stroke prophylaxis, given that she has previously declined Coumadin anticoagulation.

## 2012-12-12 NOTE — Assessment & Plan Note (Signed)
She is scheduled for annual followup study this April.

## 2012-12-12 NOTE — Assessment & Plan Note (Signed)
Will check followup labs 

## 2012-12-12 NOTE — Progress Notes (Signed)
Primary Cardiologist:  HPI: Scheduled six-month followup.  She recently had to take 1 or 2 extra pills of Demadex for management of worsening LE edema, and a corresponding weight gain of 2-3 pounds. She was seen by her primary M.D. regarding this. She was not told that she was in CHF, did not have a CXR, and has not had followup labs drawn.  She is otherwise doing well, reporting no interim CP or significant palpitations. She denies dizziness or near-syncope/syncope.   12-lead EKG today, reviewed by me, suggests idiojunctional rhythm at 52 bpm  No Known Allergies  Current Outpatient Prescriptions  Medication Sig Dispense Refill  . acetaminophen (TYLENOL) 325 MG tablet Take 650 mg by mouth every 6 (six) hours as needed.        Marland Kitchen amLODipine (NORVASC) 10 MG tablet Take 1 tablet (10 mg total) by mouth daily.  30 tablet  6  . aspirin 81 MG tablet Take 81 mg by mouth daily.      Marland Kitchen atorvastatin (LIPITOR) 40 MG tablet Take 40 mg by mouth daily.      . cholecalciferol (VITAMIN D) 1000 UNITS tablet Take 1,000 Units by mouth daily.      . Choline Fenofibrate (TRILIPIX) 135 MG capsule Take 135 mg by mouth daily.        . clopidogrel (PLAVIX) 75 MG tablet Take 75 mg by mouth daily.       Marland Kitchen glimepiride (AMARYL) 2 MG tablet Take 4 mg by mouth daily before breakfast.       . glipiZIDE (GLUCOTROL) 5 MG tablet Take 5 mg by mouth 2 (two) times daily before a meal.      . insulin glargine (LANTUS) 100 UNIT/ML injection Inject 40 Units into the skin daily.      Marland Kitchen linagliptin (TRADJENTA) 5 MG TABS tablet Take 5 mg by mouth daily.      . nebivolol (BYSTOLIC) 5 MG tablet Take 1 tablet (5 mg total) by mouth daily.  30 tablet  6  . nitroGLYCERIN (NITROSTAT) 0.4 MG SL tablet Place 1 tablet (0.4 mg total) under the tongue every 5 (five) minutes as needed.  25 tablet  3  . potassium chloride (MICRO-K) 10 MEQ CR capsule Take 1 capsule (10 mEq total) by mouth 2 (two) times daily.  60 capsule  6  . torsemide (DEMADEX)  20 MG tablet Take 2 tablets (40 mg total) by mouth 2 (two) times daily.  120 tablet  3    Past Medical History  Diagnosis Date  . Respiratory distress   . Chronic renal insufficiency   . Hypertension   . Hypokalemia   . DM (diabetes mellitus)   . Myocardial infarction     NSTEMI/DES LAD, 02/2010  . Shortness of breath   . Cancer     skin cancer  . Chronic diastolic heart failure     EF 16-10%, echo, 02/2012  . Valvular heart disease     Moderate MR; severe TR, echo, 02/2012  . Pulmonary hypertension     Severe (RVSP 63 mmHg), echo, 02/2012  . CAD (coronary artery disease)   . Carotid artery disease     50-60% RICA; 50% LICA, 03/2012  . HLD (hyperlipidemia)   . Atrial fibrillation   . Peripheral edema     Past Surgical History  Procedure Date  . Total abdominal hysterectomy   . Cholecystectomy   . Cardiac catheterization     with stent 2011  . Coronary stent placement 2011  .  Cataract extraction w/phaco 08/29/2011    Procedure: CATARACT EXTRACTION PHACO AND INTRAOCULAR LENS PLACEMENT (IOC);  Surgeon: Gemma Payor;  Location: [facility];  Service: Ophthalmology;  Laterality: Right;  CDE: 10.25  . Eye surgery Sept. 2012    right KPE w/ IOL  . Cataract extraction w/phaco 09/08/2011    Procedure: CATARACT EXTRACTION PHACO AND INTRAOCULAR LENS PLACEMENT (IOC);  Surgeon: Gemma Payor;  Location: [facility];  Service: Ophthalmology;  Laterality: Left;  CDE: 10.77    History   Social History  . Marital Status: Married    Spouse Name: N/A    Number of Children: N/A  . Years of Education: N/A   Occupational History  . Retired    Social History Main Topics  . Smoking status: Never Smoker   . Smokeless tobacco: Never Used  . Alcohol Use: No  . Drug Use: No  . Sexually Active: Not on file   Other Topics Concern  . Not on file   Social History Narrative  . No narrative on file    Family History  Problem Relation Age of Onset  . Coronary artery disease Neg Hx   . Anesthesia  problems Neg Hx   . Hypotension Neg Hx   . Malignant hyperthermia Neg Hx   . Pseudochol deficiency Neg Hx     ROS: no nausea, vomiting; no fever, chills; no melena, hematochezia; no claudication  PHYSICAL EXAM: BP 144/78  Pulse 52  Ht 5' 3.5" (1.613 m)  Wt 123 lb (55.792 kg)  BMI 21.45 kg/m2 GENERAL: 79 year old female, sitting upright; NAD  HEENT: NCAT, PERRLA, EOMI; sclera clear; no xanthelasma  NECK: JVD noted on the right  LUNGS: CTA bilaterally  CARDIAC: RRR (S1, S2); no significant murmurs; no rubs or gallops  ABDOMEN: soft, non-tender; intact BS  EXTREMETIES: 1+ bilateral peripheral edema, diffuse erythema  SKIN: warm/dry; no obvious rash/lesions  MUSCULOSKELETAL: no joint deformity  NEURO: no focal deficit; NL affect    EKG: reviewed and available in Electronic Records   ASSESSMENT & PLAN:  Chronic diastolic heart failure Patient has gained 7 pounds since last OV, continues to have mild residual peripheral edema, and has NYHA 2-3 symptoms. Will check followup labs, including BNP level, and then make further recommendations regarding her current diuretic regimen. Recommend early clinic followup with me in one month.  Atrial fibrillation Whereas she was in NSR at time of last OV, she now presents in apparent idiojunctional rhythm at approximately 50 bpm. Her daughter states that her pulse is very stable in the 50 bpm range. Patient denies any associated symptoms. Will continue same dose Bystolic for rate control and ASA/Plavix therapy for stroke prophylaxis, given that she has previously declined Coumadin anticoagulation.  CAD (coronary artery disease) Quiescent on current medication regimen  Carotid artery disease She is scheduled for annual followup study this April.  Chronic renal insufficiency Will check followup labs  Valvular heart disease Will consider a followup echocardiogram at time of next OV.    Gene Cynthis Purington, PAC

## 2012-12-13 ENCOUNTER — Telehealth: Payer: Self-pay | Admitting: Physician Assistant

## 2012-12-13 DIAGNOSIS — R7989 Other specified abnormal findings of blood chemistry: Secondary | ICD-10-CM

## 2012-12-13 DIAGNOSIS — R0602 Shortness of breath: Secondary | ICD-10-CM

## 2012-12-13 DIAGNOSIS — R635 Abnormal weight gain: Secondary | ICD-10-CM

## 2012-12-13 NOTE — Telephone Encounter (Signed)
[name] calling back.  

## 2012-12-14 NOTE — Telephone Encounter (Signed)
Notes Recorded by Lesle Chris, LPN on 03/13/8118 at 10:21 AM Daughter Rubye Oaks) notified. Will fax lab order to [facility]. She will go on January 17. Notes Recorded by Lesle Chris, LPN on 12/09/7827 at 11:02 AM Left message to return call.   Notes Recorded by Prescott Parma, PA on 12/13/2012 at 10:10 AM BNP improved, but still elevated at 340. Recommend increasing Demadex to 60 bid (x3 days), then resuming previous dose of 40 bid. F/u BMET/BNP in 1 week.

## 2012-12-31 ENCOUNTER — Telehealth: Payer: Self-pay | Admitting: *Deleted

## 2012-12-31 NOTE — Telephone Encounter (Signed)
Daughter Eunice Blase) notified.  Already has follow up OV 2/14.

## 2012-12-31 NOTE — Telephone Encounter (Signed)
Notes Recorded by Prescott Parma, PA on 12/26/2012 at 8:22 AM Labs stable with CKD, BNP stable around 340, unchanged. Continue current medication regimen.

## 2013-01-18 ENCOUNTER — Ambulatory Visit: Payer: Medicare Other | Admitting: Physician Assistant

## 2013-01-21 ENCOUNTER — Telehealth: Payer: Self-pay | Admitting: Cardiology

## 2013-01-21 NOTE — Telephone Encounter (Signed)
Going with degent

## 2013-01-31 ENCOUNTER — Ambulatory Visit: Payer: Medicare Other | Admitting: Physician Assistant

## 2013-03-01 ENCOUNTER — Other Ambulatory Visit: Payer: Self-pay

## 2013-03-01 MED ORDER — CLOPIDOGREL BISULFATE 75 MG PO TABS: 75.0000 mg | ORAL_TABLET | Freq: Every day | ORAL | Status: DC

## 2013-03-07 ENCOUNTER — Ambulatory Visit: Payer: Self-pay

## 2013-03-21 ENCOUNTER — Other Ambulatory Visit: Payer: Self-pay

## 2013-03-21 MED ORDER — CHOLINE FENOFIBRATE 135 MG PO CPDR: 135.0000 mg | DELAYED_RELEASE_CAPSULE | Freq: Every day | ORAL | Status: DC

## 2013-04-05 ENCOUNTER — Other Ambulatory Visit: Payer: Self-pay | Admitting: *Deleted

## 2013-04-05 DIAGNOSIS — R0602 Shortness of breath: Secondary | ICD-10-CM | POA: Insufficient documentation

## 2013-04-05 MED ORDER — ATORVASTATIN CALCIUM 40 MG PO TABS: 40.0000 mg | ORAL_TABLET | Freq: Every day | ORAL | Status: DC

## 2013-04-18 ENCOUNTER — Other Ambulatory Visit: Payer: Self-pay

## 2013-04-18 MED ORDER — CHOLINE FENOFIBRATE 135 MG PO CPDR: 135.0000 mg | DELAYED_RELEASE_CAPSULE | Freq: Every day | ORAL | Status: DC

## 2013-04-18 MED ORDER — NEBIVOLOL HCL 5 MG PO TABS: 5.0000 mg | ORAL_TABLET | Freq: Every day | ORAL | Status: DC

## 2013-05-01 ENCOUNTER — Ambulatory Visit (INDEPENDENT_AMBULATORY_CARE_PROVIDER_SITE_OTHER): Payer: Medicare Other | Admitting: Nurse Practitioner

## 2013-05-01 ENCOUNTER — Encounter: Payer: Self-pay | Admitting: Nurse Practitioner

## 2013-05-01 VITALS — BP 164/62 | HR 49 | Temp 97.0°F | Ht 61.0 in | Wt 119.0 lb

## 2013-05-01 DIAGNOSIS — E876 Hypokalemia: Secondary | ICD-10-CM

## 2013-05-01 DIAGNOSIS — IMO0001 Reserved for inherently not codable concepts without codable children: Secondary | ICD-10-CM

## 2013-05-01 DIAGNOSIS — E782 Mixed hyperlipidemia: Secondary | ICD-10-CM

## 2013-05-01 DIAGNOSIS — I10 Essential (primary) hypertension: Secondary | ICD-10-CM

## 2013-05-01 LAB — POCT GLYCOSYLATED HEMOGLOBIN (HGB A1C): Hemoglobin A1C: 7.8

## 2013-05-01 LAB — COMPLETE METABOLIC PANEL WITH GFR
ALT: 18 U/L (ref 0–35)
AST: 23 U/L (ref 0–37)
Alkaline Phosphatase: 60 U/L (ref 39–117)
Creat: 1.94 mg/dL — ABNORMAL HIGH (ref 0.50–1.10)
GFR, Est African American: 27 mL/min — ABNORMAL LOW
Sodium: 146 mEq/L — ABNORMAL HIGH (ref 135–145)
Total Bilirubin: 0.6 mg/dL (ref 0.3–1.2)

## 2013-05-01 LAB — POCT CBC
HCT, POC: 37 % — AB (ref 37.7–47.9)
Hemoglobin: 12.5 g/dL (ref 12.2–16.2)
MCH, POC: 28 pg (ref 27–31.2)
MCHC: 33.9 g/dL (ref 31.8–35.4)
MPV: 8 fL (ref 0–99.8)
POC LYMPH PERCENT: 27.3 %L (ref 10–50)
WBC: 7 10*3/uL (ref 4.6–10.2)

## 2013-05-01 MED ORDER — POTASSIUM CHLORIDE ER 10 MEQ PO CPCR: 10.0000 meq | ORAL_CAPSULE | Freq: Two times a day (BID) | ORAL | Status: DC

## 2013-05-01 MED ORDER — CHOLINE FENOFIBRATE 135 MG PO CPDR: 135.0000 mg | DELAYED_RELEASE_CAPSULE | Freq: Every day | ORAL | Status: DC

## 2013-05-01 MED ORDER — ATORVASTATIN CALCIUM 40 MG PO TABS: 40.0000 mg | ORAL_TABLET | Freq: Every day | ORAL | Status: DC

## 2013-05-01 NOTE — Patient Instructions (Addendum)

## 2013-05-01 NOTE — Progress Notes (Signed)
Subjective:    Patient ID: Townsend Roger, female    DOB: 1928-07-09, 79 y.o.   MRN: [id number]  Hypertension This is a chronic problem. The current episode started more than 1 year ago. The problem has been waxing and waning since onset. The problem is uncontrolled. Pertinent negatives include no anxiety, blurred vision, chest pain, palpitations or peripheral edema. Risk factors for coronary artery disease include diabetes mellitus, dyslipidemia and post-menopausal state. Past treatments include calcium channel blockers and beta blockers. The current treatment provides mild improvement.  Hyperlipidemia This is a chronic problem. The current episode started more than 1 year ago. The problem is controlled. Recent lipid tests were reviewed and are normal. Exacerbating diseases include diabetes. Pertinent negatives include no chest pain. The current treatment provides mild improvement of lipids. Risk factors for coronary artery disease include diabetes mellitus, dyslipidemia and post-menopausal.  Diabetes She presents for her follow-up diabetic visit. She has type 2 diabetes mellitus. Her disease course has been fluctuating. There are no hypoglycemic associated symptoms. Associated symptoms include fatigue and weakness. Pertinent negatives for diabetes include no blurred vision, no chest pain, no foot ulcerations and no visual change. There are no hypoglycemic complications. Pertinent negatives for hypoglycemia complications include no blackouts. Symptoms are stable. Pertinent negatives for diabetic complications include no peripheral neuropathy. Risk factors for coronary artery disease include diabetes mellitus and dyslipidemia. Current diabetic treatment includes insulin injections. She is compliant with treatment some of the time. Her weight is fluctuating minimally. She is following a generally unhealthy diet. When asked about meal planning, she reported none. She has not had a previous visit with a  dietician. She rarely participates in exercise. Her breakfast blood glucose is taken between 6-7 am. Her breakfast blood glucose range is generally 140-180 mg/dl. An ACE inhibitor/angiotensin II receptor blocker is not being taken. She sees a podiatrist (April 2014).Eye exam is not current (pt states around a year and half).      Review of Systems  Constitutional: Positive for fatigue.  Eyes: Negative for blurred vision.  Cardiovascular: Negative for chest pain and palpitations.  Neurological: Positive for weakness.  All other systems reviewed and are negative.       Objective:   Physical Exam  Vitals reviewed. Constitutional: She is oriented to person, place, and time. She appears well-developed and well-nourished.  HENT:  Head: Normocephalic.  Eyes: Pupils are equal, round, and reactive to light.  Neck: Normal range of motion. No JVD present. No thyromegaly present.  Cardiovascular: Normal rate.   Bradycardia   Pulmonary/Chest: Effort normal.  Diminished Breath sounds bilaterally   Abdominal: Soft. She exhibits no mass. There is no tenderness. There is no rebound.  Musculoskeletal: Normal range of motion. She exhibits no edema.  Neurological: She is alert and oriented to person, place, and time.  Skin: Skin is warm and dry.  Moles on face with ecchymosis on bilateral arms   Psychiatric: She has a normal mood and affect. Her behavior is normal.    BP 164/62  Pulse 49  Temp(Src) 97 F (36.1 C) (Oral)  Ht 5\' 1"  (1.549 m)  Wt 119 lb (53.978 kg)  BMI 22.5 kg/m2  Results for orders placed in visit on 05/01/13  POCT GLYCOSYLATED HEMOGLOBIN (HGB A1C)      Result Value Range   Hemoglobin A1C 7.8%    POCT CBC      Result Value Range   WBC 7.0  4.6 - 10.2 K/uL   Lymph, poc 1.9  0.6 -  3.4   POC LYMPH PERCENT 27.3  10 - 50 %L   POC Granulocyte 4.6  2 - 6.9   Granulocyte percent 65.4  37 - 80 %G   RBC 4.5  4.04 - 5.48 M/uL   Hemoglobin 12.5  12.2 - 16.2 g/dL   HCT, POC  16.1 (*) 09.6 - 47.9 %   MCV 82.8  80 - 97 fL   MCH, POC 28.0  27 - 31.2 pg   MCHC 33.9  31.8 - 35.4 g/dL   RDW, POC 04.5     Platelet Count, POC 224.0  142 - 424 K/uL   MPV 8.0  0 - 99.8 fL        Assessment & Plan:   1. Diabetes mellitus, insulin dependent (IDDM), uncontrolled   2. HTN (hypertension)   3. Elevated triglycerides with high cholesterol    Orders Placed This Encounter  Procedures  . COMPLETE METABOLIC PANEL WITH GFR  . NMR Lipoprofile with Lipids  . POCT glycosylated hemoglobin (Hb A1C)  . POCT CBC     Medication List       These changes are accurate as of: 05/01/2013 11:55 AM. If you have any questions, ask your nurse or doctor.          STOP taking these medications       acetaminophen 325 MG tablet  Commonly known as:  TYLENOL  Stopped by:  Bennie Pierini, FNP     glipiZIDE 5 MG tablet  Commonly known as:  GLUCOTROL  Stopped by:  Bennie Pierini, FNP     insulin glargine 100 UNIT/ML injection  Commonly known as:  LANTUS  Stopped by:  Bennie Pierini, FNP      TAKE these medications       amLODipine 10 MG tablet  Commonly known as:  NORVASC  5 mg. Take 5 mg by mouth.     aspirin 81 MG tablet  Take 81 mg by mouth daily.     atorvastatin 40 MG tablet  Commonly known as:  LIPITOR  Take 1 tablet (40 mg total) by mouth daily.     BD PEN NEEDLE NANO U/F 32G X 4 MM Misc  Generic drug:  Insulin Pen Needle     cholecalciferol 1000 UNITS tablet  Commonly known as:  VITAMIN D  Take 1,000 Units by mouth daily.     Choline Fenofibrate 135 MG capsule  Commonly known as:  TRILIPIX  Take 1 capsule (135 mg total) by mouth daily.     clopidogrel 75 MG tablet  Commonly known as:  PLAVIX  Take 1 tablet (75 mg total) by mouth daily.     glimepiride 2 MG tablet  Commonly known as:  AMARYL  Take 4 mg by mouth daily before breakfast.     LANTUS SOLOSTAR 100 UNIT/ML Sopn  Generic drug:  Insulin Glargine  Inject 25 Units into the  skin daily. Inject into the skin. Inject 40 Units into the skin daily.     linagliptin 5 MG Tabs tablet  Commonly known as:  TRADJENTA  Take 1 tablet by mouth daily. Take by mouth. Take 5 mg by mouth daily.     nebivolol 5 MG tablet  Commonly known as:  BYSTOLIC  Take 2.5 mg by mouth daily.  Changed by:  Bennie Pierini, FNP     nitroGLYCERIN 0.4 MG SL tablet  Commonly known as:  NITROSTAT  Place 1 tablet (0.4 mg total) under the tongue every 5 (five) minutes as  needed.     potassium chloride 10 MEQ CR capsule  Commonly known as:  MICRO-K  Take 1 capsule (10 mEq total) by mouth 2 (two) times daily.     torsemide 20 MG tablet  Commonly known as:  DEMADEX  Take 2 tablets (40 mg total) by mouth 2 (two) times daily.       Continue all meds Labs pending Patient has only been doing 25u of lantus when blood sugar greater  Than 150.- A1C up from last visit-start lantus 10 u Qhs for now and see how she does- Keep diary of blood sugars. Go back on Bystlic 5mg  1 whole tablet Qd Follow -up in 3 months   Mary-Margaret Daphine Deutscher, FNP

## 2013-05-02 LAB — NMR LIPOPROFILE WITH LIPIDS
HDL Particle Number: 35.1 umol/L (ref >=30.5)
HDL-C: 40 mg/dL (ref >=40)
LDL (calc): 87 mg/dL (ref <100)
LDL Size: 20.1 nm — ABNORMAL LOW (ref >20.5)
LP-IR Score: 81 — ABNORMAL HIGH (ref <=45)
Triglycerides: 261 mg/dL — ABNORMAL HIGH (ref <150)

## 2013-05-03 ENCOUNTER — Other Ambulatory Visit: Payer: Self-pay

## 2013-05-03 MED ORDER — GLIMEPIRIDE 4 MG PO TABS: ORAL_TABLET | ORAL | Status: DC

## 2013-05-24 ENCOUNTER — Other Ambulatory Visit: Payer: Self-pay

## 2013-05-24 MED ORDER — CLOPIDOGREL BISULFATE 75 MG PO TABS: 75.0000 mg | ORAL_TABLET | Freq: Every day | ORAL | Status: DC

## 2013-05-31 ENCOUNTER — Other Ambulatory Visit: Payer: Self-pay | Admitting: *Deleted

## 2013-05-31 MED ORDER — AMLODIPINE BESYLATE 10 MG PO TABS: 10.0000 mg | ORAL_TABLET | Freq: Every day | ORAL | Status: DC

## 2013-06-10 ENCOUNTER — Other Ambulatory Visit: Payer: Self-pay

## 2013-06-10 MED ORDER — LINAGLIPTIN 5 MG PO TABS: ORAL_TABLET | ORAL | Status: DC

## 2013-06-26 ENCOUNTER — Ambulatory Visit (INDEPENDENT_AMBULATORY_CARE_PROVIDER_SITE_OTHER): Payer: Medicare Other | Admitting: Cardiovascular Disease

## 2013-06-26 ENCOUNTER — Encounter: Payer: Self-pay | Admitting: Cardiovascular Disease

## 2013-06-26 VITALS — BP 158/67 | HR 55 | Ht 61.0 in | Wt 122.4 lb

## 2013-06-26 DIAGNOSIS — I10 Essential (primary) hypertension: Secondary | ICD-10-CM

## 2013-06-26 DIAGNOSIS — I658 Occlusion and stenosis of other precerebral arteries: Secondary | ICD-10-CM

## 2013-06-26 DIAGNOSIS — I6529 Occlusion and stenosis of unspecified carotid artery: Secondary | ICD-10-CM

## 2013-06-26 DIAGNOSIS — I4891 Unspecified atrial fibrillation: Secondary | ICD-10-CM

## 2013-06-26 DIAGNOSIS — I5032 Chronic diastolic (congestive) heart failure: Secondary | ICD-10-CM

## 2013-06-26 DIAGNOSIS — I2510 Atherosclerotic heart disease of native coronary artery without angina pectoris: Secondary | ICD-10-CM

## 2013-06-26 DIAGNOSIS — I38 Endocarditis, valve unspecified: Secondary | ICD-10-CM

## 2013-06-26 DIAGNOSIS — I6523 Occlusion and stenosis of bilateral carotid arteries: Secondary | ICD-10-CM

## 2013-06-26 MED ORDER — AMLODIPINE BESYLATE 10 MG PO TABS: 10.0000 mg | ORAL_TABLET | Freq: Every day | ORAL | Status: DC

## 2013-06-26 NOTE — Patient Instructions (Addendum)
Your physician has recommended you make the following change in your medication:   INCREASE YOUR NORVASC 10 MG ONCE DAILY (TAKE ALL OTHER MEDICATIONS THE SAME)  Your physician has requested that you have a carotid duplex. This test is an ultrasound of the carotid arteries in your neck. It looks at blood flow through these arteries that supply the brain with blood. Allow one hour for this exam. There are no restrictions or special instructions.  Your physician wants you to follow-up in: 6 MONTHS You will receive a reminder letter in the mail two months in advance. If you don't receive a letter, please call our office to schedule the follow-up appointment.

## 2013-06-26 NOTE — Progress Notes (Signed)
Patient ID: Robin [name], female   DOB: 09-02-1928, 79 y.o.   MRN: [id number]    SUBJECTIVE: Robin [name] has a h/o chronic diastolic heart failure, CAD (stent in 2011), atrial fibrillation, carotid artery stenosis, and valvular heart disease. Her most recent echo in March 2013 revealed normal LV systolic function, EF 60-65%, diastolic dysfunction showing restrictive physiology, elevated LVEDP, moderate MR, severe TR, severe pulmonary hypertension (63 mmHg), a small pericardial effusion, and a dilated IVC.  She denies chest pain, shortness of breath, weight gain, and feels very well. She's had no increased weight gain.   Filed Vitals:   06/26/13 1433  BP: 158/67  Pulse: 55  Height: 5\' 1"  (1.549 m)  Weight: 122 lb 6.4 oz (55.52 kg)     PHYSICAL EXAM General: NAD Neck: No JVD, no thyromegaly or thyroid nodule.  Lungs: Clear to auscultation bilaterally with normal respiratory effort. CV: Nondisplaced PMI.  Heart regular S1/S2, no S3/S4, no murmur.  Trace pedal edema.  No carotid bruit.  Normal pedal pulses.  Abdomen: Soft, nontender, no hepatosplenomegaly, no distention.  Neurologic: Alert and oriented x 3.  Psych: Normal affect. Extremities: No clubbing or cyanosis.     LABS: none found    ASSESSMENT AND PLAN: Chronic diastolic heart failure  Euvolemic and stable. Will increase Amlodipine to keep HTN under better control, so as to avoid decompensation from this standpoint.  Paroxysmal Atrial fibrillation  Currently in sinus rhythm. Will continue same dose of Bystolic and ASA/Plavix therapy for stroke prophylaxis, given that she has previously declined Coumadin anticoagulation.   CAD (coronary artery disease)  Quiescent on current medication regimen   Carotid artery disease  She had bilateral mild stenosis (40-59%) in April 2013. Will repeat Dopplers.  Chronic renal insufficiency   Valvular heart disease  Symptomatically stable. Will consider a followup echocardiogram at  time of next OV.    Prentice Docker, M.D., F.A.C.C.

## 2013-07-10 ENCOUNTER — Encounter (INDEPENDENT_AMBULATORY_CARE_PROVIDER_SITE_OTHER): Payer: Medicare Other

## 2013-07-10 DIAGNOSIS — I6529 Occlusion and stenosis of unspecified carotid artery: Secondary | ICD-10-CM

## 2013-07-10 DIAGNOSIS — I6523 Occlusion and stenosis of bilateral carotid arteries: Secondary | ICD-10-CM

## 2013-07-17 ENCOUNTER — Telehealth: Payer: Self-pay | Admitting: *Deleted

## 2013-07-17 NOTE — Telephone Encounter (Signed)
Notes Recorded by Lesle Chris, LPN on 3/66/4403 at 9:19 AM Patient notified and verbalized understanding.

## 2013-07-17 NOTE — Telephone Encounter (Signed)
Message copied by Lesle Chris on Wed Jul 17, 2013  9:19 AM ------      Message from: Andrey Cota A      Created: Tue Jul 16, 2013  4:37 PM                   ----- Message -----         From: Laqueta Linden, MD         Sent: 07/16/2013   1:18 PM           To: Octavia Chowoe            Reviewed. Schedule for repeat Dopplers in 6 months. ------

## 2013-07-22 ENCOUNTER — Other Ambulatory Visit: Payer: Self-pay | Admitting: *Deleted

## 2013-07-22 MED ORDER — TORSEMIDE 20 MG PO TABS: ORAL_TABLET | ORAL | Status: DC

## 2013-07-24 ENCOUNTER — Encounter: Payer: Self-pay | Admitting: *Deleted

## 2013-07-26 ENCOUNTER — Other Ambulatory Visit: Payer: Self-pay

## 2013-07-26 MED ORDER — NEBIVOLOL HCL 5 MG PO TABS: 2.5000 mg | ORAL_TABLET | Freq: Every day | ORAL | Status: DC

## 2013-08-02 ENCOUNTER — Ambulatory Visit: Payer: Medicare Other | Admitting: Nurse Practitioner

## 2013-08-02 ENCOUNTER — Ambulatory Visit (INDEPENDENT_AMBULATORY_CARE_PROVIDER_SITE_OTHER): Payer: Medicare Other | Admitting: Nurse Practitioner

## 2013-08-02 ENCOUNTER — Encounter: Payer: Self-pay | Admitting: Nurse Practitioner

## 2013-08-02 VITALS — BP 147/65 | HR 50 | Temp 97.9°F | Ht 61.0 in | Wt 124.0 lb

## 2013-08-02 DIAGNOSIS — I10 Essential (primary) hypertension: Secondary | ICD-10-CM

## 2013-08-02 DIAGNOSIS — E119 Type 2 diabetes mellitus without complications: Secondary | ICD-10-CM

## 2013-08-02 DIAGNOSIS — E785 Hyperlipidemia, unspecified: Secondary | ICD-10-CM

## 2013-08-02 LAB — GLUCOSE, POCT (MANUAL RESULT ENTRY)

## 2013-08-02 LAB — POCT GLYCOSYLATED HEMOGLOBIN (HGB A1C): Hemoglobin A1C: 9.9

## 2013-08-02 NOTE — Patient Instructions (Signed)

## 2013-08-02 NOTE — Progress Notes (Signed)
Subjective:    Patient ID: Robin [name], female    DOB: 20-Jan-1928, 79 y.o.   MRN: [id number]  Hypertension This is a chronic problem. The current episode started more than 1 year ago. The problem has been waxing and waning since onset. The problem is uncontrolled. Pertinent negatives include no anxiety, blurred vision, chest pain, palpitations or peripheral edema. Risk factors for coronary artery disease include diabetes mellitus, dyslipidemia and post-menopausal state. Past treatments include calcium channel blockers and beta blockers. The current treatment provides mild improvement.  Hyperlipidemia This is a chronic problem. The current episode started more than 1 year ago. The problem is controlled. Recent lipid tests were reviewed and are normal. Exacerbating diseases include diabetes. Pertinent negatives include no chest pain. The current treatment provides mild improvement of lipids. Risk factors for coronary artery disease include diabetes mellitus, dyslipidemia and post-menopausal.  Diabetes She presents for her follow-up diabetic visit. She has type 2 diabetes mellitus. Her disease course has been fluctuating. There are no hypoglycemic associated symptoms. Associated symptoms include fatigue and weakness. Pertinent negatives for diabetes include no blurred vision, no chest pain, no foot ulcerations and no visual change. There are no hypoglycemic complications. Pertinent negatives for hypoglycemia complications include no blackouts. Symptoms are stable. Pertinent negatives for diabetic complications include no peripheral neuropathy. Risk factors for coronary artery disease include diabetes mellitus and dyslipidemia. Current diabetic treatment includes insulin injections (daughter increased lantus to 20 units). She is compliant with treatment some of the time. She is currently taking insulin at bedtime. Insulin injections are given by adult caretaker. Rotation sites for injection include the  abdominal wall. Her weight is fluctuating minimally. She is following a generally unhealthy diet. When asked about meal planning, she reported none. She has not had a previous visit with a dietician. She rarely participates in exercise. Her home blood glucose trend is increasing steadily. Her breakfast blood glucose is taken between 6-7 am. Her breakfast blood glucose range is generally 140-180 mg/dl. An ACE inhibitor/angiotensin II receptor blocker is not being taken. She sees a podiatrist (April 2014).Eye exam is not current (pt states around a year and half).  Hypokalemia Potassium supplements daily- No c/o lower ext cramps    Review of Systems  Constitutional: Positive for fatigue.  Eyes: Negative for blurred vision.  Cardiovascular: Negative for chest pain and palpitations.  Neurological: Positive for weakness.  All other systems reviewed and are negative.  BP 147/65  Pulse 50  Temp(Src) 97.9 F (36.6 C) (Oral)  Ht 5\' 1"  (1.549 m)  Wt 124 lb (56.246 kg)  BMI 23.44 kg/m2      Objective:   Physical Exam  Vitals reviewed. Constitutional: She is oriented to person, place, and time. She appears well-developed and well-nourished.  HENT:  Head: Normocephalic.  Eyes: Pupils are equal, round, and reactive to light.  Neck: Normal range of motion. No JVD present. No thyromegaly present.  Cardiovascular: Normal rate.   Bradycardia   Pulmonary/Chest: Effort normal.  Diminished Breath sounds bilaterally   Abdominal: Soft. She exhibits no mass. There is no tenderness. There is no rebound.  Musculoskeletal: Normal range of motion. She exhibits no edema.  Neurological: She is alert and oriented to person, place, and time.  Skin: Skin is warm and dry.  Moles on face with ecchymosis on bilateral arms   Psychiatric: She has a normal mood and affect. Her behavior is normal.   BP 147/65  Pulse 50  Temp(Src) 97.9 F (36.6 C) (Oral)  Ht  5\' 1"  (1.549 m)  Wt 124 lb (56.246 kg)  BMI 23.44  kg/m2 Results for orders placed in visit on 08/02/13  GLUCOSE, POCT (MANUAL RESULT ENTRY)      Result Value Range   POC Glucose 245mg /dl  70 - 99 mg/dl  POCT GLYCOSYLATED HEMOGLOBIN (HGB A1C)      Result Value Range   Hemoglobin A1C 9.9          Assessment & Plan:   1. Diabetes   2. Hyperlipidemia   3. Hypertension    Orders Placed This Encounter  Procedures  . NMR, lipoprofile  . CMP14+EGFR  . POCT glucose (manual entry)  . POCT glycosylated hemoglobin (Hb A1C)     Medication List       This list is accurate as of: 08/02/13  8:57 AM.  Always use your most recent med list.               amLODipine 10 MG tablet  Commonly known as:  NORVASC  Take 1 tablet (10 mg total) by mouth daily.     aspirin 81 MG tablet  Take 81 mg by mouth daily.     atorvastatin 40 MG tablet  Commonly known as:  LIPITOR  Take 1 tablet (40 mg total) by mouth daily.     BD PEN NEEDLE NANO U/F 32G X 4 MM Misc  Generic drug:  Insulin Pen Needle     cholecalciferol 1000 UNITS tablet  Commonly known as:  VITAMIN D  Take 1,000 Units by mouth daily.     Choline Fenofibrate 135 MG capsule  Commonly known as:  TRILIPIX  Take 1 capsule (135 mg total) by mouth daily.     clopidogrel 75 MG tablet  Commonly known as:  PLAVIX  Take 1 tablet (75 mg total) by mouth daily.     glimepiride 4 MG tablet  Commonly known as:  AMARYL  One tablet daily     LANTUS SOLOSTAR 100 UNIT/ML Sopn  Generic drug:  Insulin Glargine  Inject 10-20 Units into the skin daily. Sliding scale     linagliptin 5 MG Tabs tablet  Commonly known as:  TRADJENTA  One daily     nebivolol 5 MG tablet  Commonly known as:  BYSTOLIC  Take 0.5 tablets (2.5 mg total) by mouth daily.     nitroGLYCERIN 0.4 MG SL tablet  Commonly known as:  NITROSTAT  Place 1 tablet (0.4 mg total) under the tongue every 5 (five) minutes as needed.     potassium chloride 10 MEQ CR capsule  Commonly known as:  MICRO-K  Take 1 capsule (10  mEq total) by mouth 2 (two) times daily.     torsemide 20 MG tablet  Commonly known as:  DEMADEX  TAKE AS DIRECTED       Continue all meds Labs pending Patient has  been doing 20u of lantus Qhs.- A1C up from last visit-increase to  lantus 25 u Qhs for 3 days then increase to 30 u Qhs and see how she does- Keep diary of blood sugars. Follow -up in 3 months Encouraged to sign up for my chart to communicate Blood sugars  Mary-Margaret Daphine Deutscher, FNP

## 2013-08-04 LAB — NMR, LIPOPROFILE
HDL Cholesterol by NMR: 38 mg/dL — ABNORMAL LOW (ref >=40)
LDL Particle Number: 1611 nmol/L — ABNORMAL HIGH (ref <1000)
LDL Size: 20.5 nm — ABNORMAL LOW (ref >20.5)
Small LDL Particle Number: 1160 nmol/L — ABNORMAL HIGH (ref <=527)

## 2013-08-04 LAB — CMP14+EGFR
Albumin: 3.9 g/dL (ref 3.5–4.7)
BUN: 42 mg/dL — ABNORMAL HIGH (ref 8–27)
CO2: 25 mmol/L (ref 18–29)
Chloride: 102 mmol/L (ref 97–108)
Creatinine, Ser: 1.87 mg/dL — ABNORMAL HIGH (ref 0.57–1.00)
GFR calc Af Amer: 28 mL/min/{1.73_m2} — ABNORMAL LOW (ref >59)
Globulin, Total: 2.5 g/dL (ref 1.5–4.5)
Glucose: 249 mg/dL — ABNORMAL HIGH (ref 65–99)
Total Protein: 6.4 g/dL (ref 6.0–8.5)

## 2013-08-07 ENCOUNTER — Ambulatory Visit: Payer: Medicare Other | Admitting: Nurse Practitioner

## 2013-08-09 ENCOUNTER — Other Ambulatory Visit: Payer: Self-pay | Admitting: *Deleted

## 2013-08-09 MED ORDER — LINAGLIPTIN 5 MG PO TABS: 5.0000 mg | ORAL_TABLET | Freq: Every day | ORAL | Status: DC

## 2013-08-23 ENCOUNTER — Other Ambulatory Visit: Payer: Self-pay | Admitting: *Deleted

## 2013-08-23 MED ORDER — CLOPIDOGREL BISULFATE 75 MG PO TABS: 75.0000 mg | ORAL_TABLET | Freq: Every day | ORAL | Status: DC

## 2013-10-07 ENCOUNTER — Other Ambulatory Visit: Payer: Self-pay | Admitting: *Deleted

## 2013-10-07 DIAGNOSIS — E876 Hypokalemia: Secondary | ICD-10-CM

## 2013-10-07 MED ORDER — GLIMEPIRIDE 4 MG PO TABS: ORAL_TABLET | ORAL | Status: DC

## 2013-10-07 MED ORDER — POTASSIUM CHLORIDE ER 10 MEQ PO CPCR: 10.0000 meq | ORAL_CAPSULE | Freq: Two times a day (BID) | ORAL | Status: DC

## 2013-10-11 ENCOUNTER — Encounter: Payer: Self-pay | Admitting: Nurse Practitioner

## 2013-10-11 ENCOUNTER — Ambulatory Visit (INDEPENDENT_AMBULATORY_CARE_PROVIDER_SITE_OTHER): Payer: Medicare Other | Admitting: Nurse Practitioner

## 2013-10-11 VITALS — BP 132/52 | HR 68 | Temp 96.9°F | Ht 61.0 in | Wt 132.0 lb

## 2013-10-11 DIAGNOSIS — I8310 Varicose veins of unspecified lower extremity with inflammation: Secondary | ICD-10-CM

## 2013-10-11 DIAGNOSIS — I872 Venous insufficiency (chronic) (peripheral): Secondary | ICD-10-CM

## 2013-10-11 DIAGNOSIS — L02419 Cutaneous abscess of limb, unspecified: Secondary | ICD-10-CM

## 2013-10-11 MED ORDER — CIPROFLOXACIN HCL 500 MG PO TABS: 500.0000 mg | ORAL_TABLET | Freq: Two times a day (BID) | ORAL | Status: DC

## 2013-10-11 NOTE — Patient Instructions (Signed)
Venous Stasis and Chronic Venous Insufficiency  As people age, the veins located in their legs may weaken and stretch. When veins weaken and lose the ability to pump blood effectively, the condition is called chronic venous insufficiency (CVI) or venous stasis. Almost all veins return blood back to the heart. This happens by:   The force of the heart pumping fresh blood pushes blood back to the heart.   Blood flowing to the heart from the force of gravity.  In the deep veins of the legs, blood has to fight gravity and flow upstream back to the heart. Here, the leg muscles contract to pump blood back toward the heart. Vein walls are elastic, and many veins have small valves that only allow blood to flow in one direction. When leg muscles contract, they push inward against the elastic vein walls. This squeezes blood upward, opens the valves, and moves blood toward the heart. When leg muscles relax, the vein wall also relaxes and the valves inside the vein close to prevent blood from flowing backward. This method of pumping blood out of the legs is called the venous pump.  CAUSES   The venous pump works best while walking and leg muscles are contracting. But when a person sits or stands, blood pressure in leg veins can build. Deep veins are usually able to withstand short periods of inactivity, but long periods of inactivity (and increased pressure) can stretch, weaken, and damage vein walls. High blood pressure can also stretch and damage vein walls. The veins may no longer be able to pump blood back to the heart. Venous hypertension (high blood pressure inside veins) that lasts over time is a primary cause of CVI. CVI can also be caused by:    Deep vein thrombosis, a condition where a thrombus (blood clot) blocks blood flow in a vein.   Phlebitis, an inflammation of a superficial vein that causes a blood clot to form.  Other risk factors for CVI may include:    Heredity.   Obesity.   Pregnancy.    Sedentary lifestyle.   Smoking.   Jobs requiring long periods of standing or sitting in one place.   Age and gender:   Women in their 40's and 50's and men in their 70's are more prone to developing CVI.  SYMPTOMS   Symptoms of CVI may include:    Varicose veins.   Ulceration or skin breakdown.   Lipodermatosclerosis, a condition that affects the skin just above the ankle, usually on the inside surface. Over time the skin becomes brown, smooth, tight and often painful. Those with this condition have a high risk of developing skin ulcers.   Reddened or discolored skin on the leg.   Swelling.  DIAGNOSIS   Your caregiver can diagnose CVI after performing a careful medical history and physical examination. To confirm the diagnosis, the following tests may also be ordered:    Duplex ultrasound.   Plethysmography (tests blood flow).   Venograms (x-ray using a special dye).  TREATMENT  The goals of treatment for CVI are to restore a person to an active life and to minimize pain or disability. Typically, CVI does not pose a serious threat to life or limb, and with proper treatment most people with this condition can continue to lead active lives. In most cases, mild CVI can be treated on an outpatient basis with simple procedures. Treatment methods include:    Elastic compression socks.   Sclerotherapy, a procedure involving an injection of   a material that "dissolves" the damaged veins. Other veins in the network of blood vessels take over the function of the damaged veins.   Vein stripping (an older procedure less commonly used).   Laser Ablation surgery.   Valve repair.  HOME CARE INSTRUCTIONS    Elastic compression socks must be worn every day. They can help with symptoms and lower the chances of the problem getting worse, but they do not cure the problem.   Only take over-the-counter or prescription medicines for pain, discomfort, or fever as directed by your caregiver.    Your caregiver will review your other medications with you.  SEEK MEDICAL CARE IF:    You are confused about how to take your medications.   There is redness, swelling, or increasing pain in the affected area.   There is a red streak or line that extends up or down from the affected area.   There is a breakdown or loss of skin in the affected area, even if the breakdown is small.   You develop an unexplained oral temperature above 102 F (38.9 C).   There is an injury to the affected area.  SEEK IMMEDIATE MEDICAL CARE IF:    There is an injury and open wound to the affected area.   Pain is not adequately relieved with pain medication prescribed or becomes severe.   An oral temperature above 102 F (38.9 C) develops.   The foot/ankle below the affected area becomes suddenly numb or the area feels weak and hard to move.  MAKE SURE YOU:    Understand these instructions.   Will watch your condition.   Will get help right away if you are not doing well or get worse.  Document Released: 03/27/2007 Document Revised: 02/13/2012 Document Reviewed: 06/04/2007  [employer] Patient Information 2014 [employer]

## 2013-10-11 NOTE — Progress Notes (Signed)
  Subjective:    Patient ID: Robin [name], female    DOB: [birth year], 79 y.o.   MRN: [id number]  HPI Patient in c/o swelling in bil ankles and feet- started about 1 week- doesn't go down after sleeping- denies any SOB. * insurance is no longer going to pay for bystolic- need to changed to something else    Review of Systems  Constitutional: Negative.   HENT: Negative.   Respiratory: Positive for shortness of breath (no more then usual).   Cardiovascular: Negative.   Gastrointestinal: Negative.   Genitourinary: Negative.        Objective:   Physical Exam  Constitutional: She appears well-developed and well-nourished.  Cardiovascular: Regular rhythm and normal heart sounds.   Pulmonary/Chest: Effort normal and breath sounds normal.  Musculoskeletal: She exhibits edema.  Skin:  bil lower ext are erythematous and hot to touch    BP 132/52  Pulse 68  Temp(Src) 96.9 F (36.1 C) (Oral)  Ht 5\' 1"  (1.549 m)  Wt 132 lb (59.875 kg)  BMI 24.95 kg/m2       Assessment & Plan:   1. Venous stasis dermatitis, unspecified laterality   2. Cellulitis and abscess of leg    bil unna boots Elevate legs when sitting Meds ordered this encounter  Medications  . ciprofloxacin (CIPRO) 500 MG tablet    Sig: Take 1 tablet (500 mg total) by mouth 2 (two) times daily.    Dispense:  20 tablet    Refill:  0    Order Specific Question:  Supervising Provider    Answer:  Ernestina Penna [[id number]]   R^TO Monday for follow up  Mary-Margaret Daphine Deutscher, FNP

## 2013-10-14 ENCOUNTER — Ambulatory Visit (INDEPENDENT_AMBULATORY_CARE_PROVIDER_SITE_OTHER): Payer: Medicare Other | Admitting: Nurse Practitioner

## 2013-10-14 ENCOUNTER — Encounter: Payer: Self-pay | Admitting: Nurse Practitioner

## 2013-10-14 VITALS — BP 136/58 | HR 50 | Temp 98.0°F | Ht 61.0 in | Wt 130.0 lb

## 2013-10-14 DIAGNOSIS — I8310 Varicose veins of unspecified lower extremity with inflammation: Secondary | ICD-10-CM

## 2013-10-14 DIAGNOSIS — I872 Venous insufficiency (chronic) (peripheral): Secondary | ICD-10-CM

## 2013-10-14 DIAGNOSIS — L02419 Cutaneous abscess of limb, unspecified: Secondary | ICD-10-CM

## 2013-10-14 NOTE — Progress Notes (Signed)
  Subjective:    Patient ID: Robin [name], female    DOB: October 10, 1928, 79 y.o.   MRN: [id number]  HPI  Patient in for recheck of venous stasis- was seen Friday and applied unna boots and started her on cipro- she is here today for follow up.    Review of Systems  All other systems reviewed and are negative.       Objective:   Physical Exam  Constitutional: She appears well-developed and well-nourished.  Cardiovascular: Normal rate, normal heart sounds and intact distal pulses.   Pulmonary/Chest: Effort normal and breath sounds normal.   Unna boots removed- mild erythema bil lower ext with no edema today  BP 136/58  Pulse 50  Temp(Src) 98 F (36.7 C) (Oral)  Ht 5\' 1"  (1.549 m)  Wt 130 lb (58.968 kg)  BMI 24.58 kg/m2        Assessment & Plan:   1. Venous stasis dermatitis, unspecified laterality   2. Cellulitis and abscess of leg    Elevate legs when sitting Finish antibiotic as rx Continue lasix as rx RTO prn  Robin Daphine Deutscher, FNP

## 2013-10-14 NOTE — Patient Instructions (Signed)
Venous Stasis and Chronic Venous Insufficiency  As people age, the veins located in their legs may weaken and stretch. When veins weaken and lose the ability to pump blood effectively, the condition is called chronic venous insufficiency (CVI) or venous stasis. Almost all veins return blood back to the heart. This happens by:   The force of the heart pumping fresh blood pushes blood back to the heart.   Blood flowing to the heart from the force of gravity.  In the deep veins of the legs, blood has to fight gravity and flow upstream back to the heart. Here, the leg muscles contract to pump blood back toward the heart. Vein walls are elastic, and many veins have small valves that only allow blood to flow in one direction. When leg muscles contract, they push inward against the elastic vein walls. This squeezes blood upward, opens the valves, and moves blood toward the heart. When leg muscles relax, the vein wall also relaxes and the valves inside the vein close to prevent blood from flowing backward. This method of pumping blood out of the legs is called the venous pump.  CAUSES   The venous pump works best while walking and leg muscles are contracting. But when a person sits or stands, blood pressure in leg veins can build. Deep veins are usually able to withstand short periods of inactivity, but long periods of inactivity (and increased pressure) can stretch, weaken, and damage vein walls. High blood pressure can also stretch and damage vein walls. The veins may no longer be able to pump blood back to the heart. Venous hypertension (high blood pressure inside veins) that lasts over time is a primary cause of CVI. CVI can also be caused by:    Deep vein thrombosis, a condition where a thrombus (blood clot) blocks blood flow in a vein.   Phlebitis, an inflammation of a superficial vein that causes a blood clot to form.  Other risk factors for CVI may include:    Heredity.   Obesity.   Pregnancy.    Sedentary lifestyle.   Smoking.   Jobs requiring long periods of standing or sitting in one place.   Age and gender:   Women in their 40's and 50's and men in their 70's are more prone to developing CVI.  SYMPTOMS   Symptoms of CVI may include:    Varicose veins.   Ulceration or skin breakdown.   Lipodermatosclerosis, a condition that affects the skin just above the ankle, usually on the inside surface. Over time the skin becomes brown, smooth, tight and often painful. Those with this condition have a high risk of developing skin ulcers.   Reddened or discolored skin on the leg.   Swelling.  DIAGNOSIS   Your caregiver can diagnose CVI after performing a careful medical history and physical examination. To confirm the diagnosis, the following tests may also be ordered:    Duplex ultrasound.   Plethysmography (tests blood flow).   Venograms (x-ray using a special dye).  TREATMENT  The goals of treatment for CVI are to restore a person to an active life and to minimize pain or disability. Typically, CVI does not pose a serious threat to life or limb, and with proper treatment most people with this condition can continue to lead active lives. In most cases, mild CVI can be treated on an outpatient basis with simple procedures. Treatment methods include:    Elastic compression socks.   Sclerotherapy, a procedure involving an injection of   a material that "dissolves" the damaged veins. Other veins in the network of blood vessels take over the function of the damaged veins.   Vein stripping (an older procedure less commonly used).   Laser Ablation surgery.   Valve repair.  HOME CARE INSTRUCTIONS    Elastic compression socks must be worn every day. They can help with symptoms and lower the chances of the problem getting worse, but they do not cure the problem.   Only take over-the-counter or prescription medicines for pain, discomfort, or fever as directed by your caregiver.    Your caregiver will review your other medications with you.  SEEK MEDICAL CARE IF:    You are confused about how to take your medications.   There is redness, swelling, or increasing pain in the affected area.   There is a red streak or line that extends up or down from the affected area.   There is a breakdown or loss of skin in the affected area, even if the breakdown is small.   You develop an unexplained oral temperature above 102 F (38.9 C).   There is an injury to the affected area.  SEEK IMMEDIATE MEDICAL CARE IF:    There is an injury and open wound to the affected area.   Pain is not adequately relieved with pain medication prescribed or becomes severe.   An oral temperature above 102 F (38.9 C) develops.   The foot/ankle below the affected area becomes suddenly numb or the area feels weak and hard to move.  MAKE SURE YOU:    Understand these instructions.   Will watch your condition.   Will get help right away if you are not doing well or get worse.  Document Released: 03/27/2007 Document Revised: 02/13/2012 Document Reviewed: 06/04/2007  [employer] Patient Information 2014 [employer]

## 2013-11-04 ENCOUNTER — Encounter: Payer: Self-pay | Admitting: Nurse Practitioner

## 2013-11-04 ENCOUNTER — Ambulatory Visit (INDEPENDENT_AMBULATORY_CARE_PROVIDER_SITE_OTHER): Payer: Medicare Other | Admitting: Nurse Practitioner

## 2013-11-04 VITALS — BP 156/61 | HR 59 | Temp 97.1°F | Ht 61.0 in | Wt 138.0 lb

## 2013-11-04 DIAGNOSIS — J984 Other disorders of lung: Secondary | ICD-10-CM

## 2013-11-04 DIAGNOSIS — I83009 Varicose veins of unspecified lower extremity with ulcer of unspecified site: Secondary | ICD-10-CM

## 2013-11-04 DIAGNOSIS — E119 Type 2 diabetes mellitus without complications: Secondary | ICD-10-CM

## 2013-11-04 DIAGNOSIS — E876 Hypokalemia: Secondary | ICD-10-CM

## 2013-11-04 DIAGNOSIS — I10 Essential (primary) hypertension: Secondary | ICD-10-CM

## 2013-11-04 DIAGNOSIS — R600 Localized edema: Secondary | ICD-10-CM

## 2013-11-04 DIAGNOSIS — R609 Edema, unspecified: Secondary | ICD-10-CM

## 2013-11-04 DIAGNOSIS — I739 Peripheral vascular disease, unspecified: Secondary | ICD-10-CM

## 2013-11-04 DIAGNOSIS — I4891 Unspecified atrial fibrillation: Secondary | ICD-10-CM

## 2013-11-04 DIAGNOSIS — E785 Hyperlipidemia, unspecified: Secondary | ICD-10-CM

## 2013-11-04 DIAGNOSIS — I129 Hypertensive chronic kidney disease with stage 1 through stage 4 chronic kidney disease, or unspecified chronic kidney disease: Secondary | ICD-10-CM

## 2013-11-04 DIAGNOSIS — R0989 Other specified symptoms and signs involving the circulatory and respiratory systems: Secondary | ICD-10-CM

## 2013-11-04 MED ORDER — CIPROFLOXACIN HCL 500 MG PO TABS: 500.0000 mg | ORAL_TABLET | Freq: Two times a day (BID) | ORAL | Status: DC

## 2013-11-04 MED ORDER — TORSEMIDE 20 MG PO TABS: ORAL_TABLET | ORAL | Status: DC

## 2013-11-04 NOTE — Patient Instructions (Signed)
Venous Stasis and Chronic Venous Insufficiency  As people age, the veins located in their legs may weaken and stretch. When veins weaken and lose the ability to pump blood effectively, the condition is called chronic venous insufficiency (CVI) or venous stasis. Almost all veins return blood back to the heart. This happens by:   The force of the heart pumping fresh blood pushes blood back to the heart.   Blood flowing to the heart from the force of gravity.  In the deep veins of the legs, blood has to fight gravity and flow upstream back to the heart. Here, the leg muscles contract to pump blood back toward the heart. Vein walls are elastic, and many veins have small valves that only allow blood to flow in one direction. When leg muscles contract, they push inward against the elastic vein walls. This squeezes blood upward, opens the valves, and moves blood toward the heart. When leg muscles relax, the vein wall also relaxes and the valves inside the vein close to prevent blood from flowing backward. This method of pumping blood out of the legs is called the venous pump.  CAUSES   The venous pump works best while walking and leg muscles are contracting. But when a person sits or stands, blood pressure in leg veins can build. Deep veins are usually able to withstand short periods of inactivity, but long periods of inactivity (and increased pressure) can stretch, weaken, and damage vein walls. High blood pressure can also stretch and damage vein walls. The veins may no longer be able to pump blood back to the heart. Venous hypertension (high blood pressure inside veins) that lasts over time is a primary cause of CVI. CVI can also be caused by:    Deep vein thrombosis, a condition where a thrombus (blood clot) blocks blood flow in a vein.   Phlebitis, an inflammation of a superficial vein that causes a blood clot to form.  Other risk factors for CVI may include:    Heredity.   Obesity.   Pregnancy.    Sedentary lifestyle.   Smoking.   Jobs requiring long periods of standing or sitting in one place.   Age and gender:   Women in their 40's and 50's and men in their 70's are more prone to developing CVI.  SYMPTOMS   Symptoms of CVI may include:    Varicose veins.   Ulceration or skin breakdown.   Lipodermatosclerosis, a condition that affects the skin just above the ankle, usually on the inside surface. Over time the skin becomes brown, smooth, tight and often painful. Those with this condition have a high risk of developing skin ulcers.   Reddened or discolored skin on the leg.   Swelling.  DIAGNOSIS   Your caregiver can diagnose CVI after performing a careful medical history and physical examination. To confirm the diagnosis, the following tests may also be ordered:    Duplex ultrasound.   Plethysmography (tests blood flow).   Venograms (x-ray using a special dye).  TREATMENT  The goals of treatment for CVI are to restore a person to an active life and to minimize pain or disability. Typically, CVI does not pose a serious threat to life or limb, and with proper treatment most people with this condition can continue to lead active lives. In most cases, mild CVI can be treated on an outpatient basis with simple procedures. Treatment methods include:    Elastic compression socks.   Sclerotherapy, a procedure involving an injection of   a material that "dissolves" the damaged veins. Other veins in the network of blood vessels take over the function of the damaged veins.   Vein stripping (an older procedure less commonly used).   Laser Ablation surgery.   Valve repair.  HOME CARE INSTRUCTIONS    Elastic compression socks must be worn every day. They can help with symptoms and lower the chances of the problem getting worse, but they do not cure the problem.   Only take over-the-counter or prescription medicines for pain, discomfort, or fever as directed by your caregiver.    Your caregiver will review your other medications with you.  SEEK MEDICAL CARE IF:    You are confused about how to take your medications.   There is redness, swelling, or increasing pain in the affected area.   There is a red streak or line that extends up or down from the affected area.   There is a breakdown or loss of skin in the affected area, even if the breakdown is small.   You develop an unexplained oral temperature above 102 F (38.9 C).   There is an injury to the affected area.  SEEK IMMEDIATE MEDICAL CARE IF:    There is an injury and open wound to the affected area.   Pain is not adequately relieved with pain medication prescribed or becomes severe.   An oral temperature above 102 F (38.9 C) develops.   The foot/ankle below the affected area becomes suddenly numb or the area feels weak and hard to move.  MAKE SURE YOU:    Understand these instructions.   Will watch your condition.   Will get help right away if you are not doing well or get worse.  Document Released: 03/27/2007 Document Revised: 02/13/2012 Document Reviewed: 06/04/2007  [employer] Patient Information 2014 [employer]

## 2013-11-04 NOTE — Progress Notes (Signed)
Subjective:    Patient ID: Robin [name], female    DOB: 03-15-1928, 79 y.o.   MRN: [id number]  HPI  Patient in today for follow up of chronic medical problems. She is not feeling well today. Her peripheral edema has worsened and the bottom of her feet are hurting. Patient Active Problem List   Diagnosis Date Noted  . Hypertension   . Carotid artery disease   . CAD (coronary artery disease)   . Valvular heart disease   . Chronic renal insufficiency   . Peripheral edema 06/22/2012  . Pleural effusion 06/22/2012  . Atrial fibrillation 05/17/2012  . Pulmonary hypertension 04/13/2012  . Chronic diastolic heart failure 03/08/2012  . Mitral regurgitation 11/21/2011  . Claudication in peripheral vascular disease 05/24/2011  . Hyperlipemia 03/22/2011  . Vitamin D deficiency 03/22/2011  . DM 03/03/2010  . HYPOPOTASSEMIA 03/03/2010  . HTN CKD UNS W/CKD STAGE I THRU STAGE IV/UNS 03/03/2010  . ACUT MI SUBENDOCARDIAL INFARCT INIT EPIS CARE 03/03/2010  . CORONARY ATHEROSCLEROSIS NATIVE CORONARY ARTERY 03/03/2010  . CHF 03/03/2010  . OTHER PULMONARY INSUFFICIENCY NEC 03/03/2010  . CHRONIC KIDNEY DISEASE UNSPECIFIED 03/03/2010  . HEMATURIA UNSPECIFIED 03/03/2010  . CAROTID BRUIT 03/03/2010  . OTHER DYSPNEA AND RESPIRATORY ABNORMALITIES 03/03/2010   Outpatient Encounter Prescriptions as of 11/04/2013  Medication Sig  . amLODipine (NORVASC) 10 MG tablet Take 1 tablet (10 mg total) by mouth daily.  Marland Kitchen aspirin 81 MG tablet Take 81 mg by mouth daily.  Marland Kitchen atorvastatin (LIPITOR) 40 MG tablet Take 1 tablet (40 mg total) by mouth daily.  . BD PEN NEEDLE NANO U/F 32G X 4 MM MISC   . cholecalciferol (VITAMIN D) 1000 UNITS tablet Take 1,000 Units by mouth daily.  . Choline Fenofibrate (TRILIPIX) 135 MG capsule Take 1 capsule (135 mg total) by mouth daily.  . clopidogrel (PLAVIX) 75 MG tablet Take 1 tablet (75 mg total) by mouth daily.  Marland Kitchen glimepiride (AMARYL) 4 MG tablet One tablet daily  . Insulin  Glargine (LANTUS SOLOSTAR) 100 UNIT/ML SOPN Inject 10-20 Units into the skin daily. Sliding scale  . linagliptin (TRADJENTA) 5 MG TABS tablet Take 1 tablet (5 mg total) by mouth daily. One daily  . nebivolol (BYSTOLIC) 5 MG tablet Take 5 mg by mouth daily.  . nitroGLYCERIN (NITROSTAT) 0.4 MG SL tablet Place 1 tablet (0.4 mg total) under the tongue every 5 (five) minutes as needed.  . potassium chloride (MICRO-K) 10 MEQ CR capsule Take 1 capsule (10 mEq total) by mouth 2 (two) times daily.  Marland Kitchen torsemide (DEMADEX) 20 MG tablet TAKE AS DIRECTED  . [DISCONTINUED] ciprofloxacin (CIPRO) 500 MG tablet Take 1 tablet (500 mg total) by mouth 2 (two) times daily.       Review of Systems  Constitutional: Negative for fever, chills, appetite change and fatigue.  HENT: Negative.   Respiratory: Negative for cough, chest tightness and shortness of breath.   Cardiovascular: Positive for leg swelling. Negative for chest pain and palpitations.  Gastrointestinal: Negative.   Genitourinary: Negative.   Musculoskeletal: Negative.   Neurological: Negative.   Hematological: Negative.   Psychiatric/Behavioral: Negative.        Objective:   Physical Exam  Constitutional: She is oriented to person, place, and time. She appears well-developed and well-nourished.  HENT:  Nose: Nose normal.  Mouth/Throat: Oropharynx is clear and moist.  Eyes: EOM are normal.  Neck: Trachea normal, normal range of motion and full passive range of motion without pain. Neck supple. No  JVD present. Carotid bruit is not present. No thyromegaly present.  Cardiovascular: Normal rate, regular rhythm, normal heart sounds and intact distal pulses.  Exam reveals no gallop and no friction rub.   No murmur heard. Pulmonary/Chest: Effort normal and breath sounds normal.  Abdominal: Soft. Bowel sounds are normal. She exhibits no distension and no mass. There is no tenderness.  Musculoskeletal: Normal range of motion.  bil lower ext are  erythematous tight and bumpy- hot to touch.  Lymphadenopathy:    She has no cervical adenopathy.  Neurological: She is alert and oriented to person, place, and time. She has normal reflexes.  Skin: Skin is warm and dry.  Psychiatric: She has a normal mood and affect. Her behavior is normal. Judgment and thought content normal.     BP 156/61  Pulse 59  Temp(Src) 97.1 F (36.2 C) (Oral)  Ht 5\' 1"  (1.549 m)  Wt 138 lb (62.596 kg)  BMI 26.09 kg/m2    Results for orders placed in visit on 11/04/13  POCT GLYCOSYLATED HEMOGLOBIN (HGB A1C)      Result Value Range   Hemoglobin A1C 7.1         Assessment & Plan:   1. DM   2. HTN CKD UNS W/CKD STAGE I THRU STAGE IV/UNS   3. Hyperlipemia   4. Peripheral edema   5. OTHER PULMONARY INSUFFICIENCY NEC   6. Hypopotassemia   7. Hypertension   8. Claudication in peripheral vascular disease   9. CAROTID BRUIT   10. Atrial fibrillation   11. Venous stasis ulcer, unspecified laterality    Orders Placed This Encounter  Procedures  . NMR, lipoprofile  . CMP14+EGFR  . Apply unna boot  . Apply unna boot  . POCT glycosylated hemoglobin (Hb A1C)   Meds ordered this encounter  Medications  . ciprofloxacin (CIPRO) 500 MG tablet    Sig: Take 1 tablet (500 mg total) by mouth 2 (two) times daily.    Dispense:  20 tablet    Refill:  0    Order Specific Question:  Supervising Provider    Answer:  Ernestina Penna [[id number]]  . torsemide (DEMADEX) 20 MG tablet    Sig: 2 po qam and one po in afternoon    Dispense:  180 tablet    Refill:  1    Order Specific Question:  Supervising Provider    Answer:  Ernestina Penna [[id number]]   Unna boots bilaterally- elevate legs when sitting- antibiotic as rx Continue all meds Labs pending Diet and exercise encouraged Health maintenance reviewed Follow up in 3 days  Mary-Margaret Daphine Deutscher, FNP

## 2013-11-06 LAB — NMR, LIPOPROFILE
Cholesterol: 103 mg/dL (ref <200)
HDL Cholesterol by NMR: 31 mg/dL — ABNORMAL LOW (ref >=40)
HDL Particle Number: 33.1 umol/L (ref >=30.5)
LDLC SERPL CALC-MCNC: 41 mg/dL (ref <100)
LP-IR Score: 49 — ABNORMAL HIGH (ref <=45)
Small LDL Particle Number: 463 nmol/L (ref <=527)
Triglycerides by NMR: 155 mg/dL — ABNORMAL HIGH (ref <150)

## 2013-11-06 LAB — CMP14+EGFR
ALT: 15 IU/L (ref 0–32)
AST: 25 IU/L (ref 0–40)
Albumin/Globulin Ratio: 1.4 (ref 1.1–2.5)
Alkaline Phosphatase: 46 IU/L (ref 39–117)
BUN/Creatinine Ratio: 21 (ref 11–26)
Calcium: 9.6 mg/dL (ref 8.6–10.2)
Chloride: 102 mmol/L (ref 97–108)
GFR calc Af Amer: 24 mL/min/{1.73_m2} — ABNORMAL LOW (ref >59)
GFR calc non Af Amer: 21 mL/min/{1.73_m2} — ABNORMAL LOW (ref >59)
Globulin, Total: 2.7 g/dL (ref 1.5–4.5)
Glucose: 95 mg/dL (ref 65–99)
Potassium: 4.9 mmol/L (ref 3.5–5.2)
Sodium: 142 mmol/L (ref 134–144)
Total Bilirubin: 0.5 mg/dL (ref 0.0–1.2)

## 2013-11-08 ENCOUNTER — Encounter: Payer: Self-pay | Admitting: Nurse Practitioner

## 2013-11-08 ENCOUNTER — Ambulatory Visit (INDEPENDENT_AMBULATORY_CARE_PROVIDER_SITE_OTHER): Payer: Medicare Other | Admitting: Nurse Practitioner

## 2013-11-08 VITALS — BP 132/52 | HR 55 | Temp 97.2°F | Ht 61.0 in | Wt 138.0 lb

## 2013-11-08 DIAGNOSIS — M79602 Pain in left arm: Secondary | ICD-10-CM

## 2013-11-08 DIAGNOSIS — R609 Edema, unspecified: Secondary | ICD-10-CM

## 2013-11-08 DIAGNOSIS — M79609 Pain in unspecified limb: Secondary | ICD-10-CM

## 2013-11-08 MED ORDER — INSULIN PEN NEEDLE 32G X 4 MM MISC: 1.0000 | Freq: Every day | Status: DC

## 2013-11-08 MED ORDER — NEBIVOLOL HCL 10 MG PO TABS: 10.0000 mg | ORAL_TABLET | Freq: Every day | ORAL | Status: DC

## 2013-11-08 MED ORDER — INSULIN GLARGINE 100 UNIT/ML SOLOSTAR PEN: 30.0000 [IU] | PEN_INJECTOR | Freq: Every day | SUBCUTANEOUS | Status: DC

## 2013-11-08 MED ORDER — METOLAZONE 2.5 MG PO TABS: 2.5000 mg | ORAL_TABLET | Freq: Every day | ORAL | Status: DC

## 2013-11-08 NOTE — Patient Instructions (Signed)

## 2013-11-08 NOTE — Addendum Note (Signed)
Addended by: Bennie Pierini on: 11/08/2013 12:16 PM   Modules accepted: Orders

## 2013-11-08 NOTE — Progress Notes (Signed)
   Subjective:    Patient ID: Robin [name], female    DOB: 08-18-1928, 79 y.o.   MRN: [id number]  HPI Patient in today for recheck of peripheral edema- we put unna boots on earlier this week an dhere today for follow up. THe swelling in lower legs is some better but  Still has lots of swelling in upper legs- denies SOB- Is c/o right shoulder and arm pain that started 2 weeks ago was bad last night was bad 8/10. Pain increases with some movement. No weight change.    Review of Systems  Constitutional: Negative.   HENT: Negative.   Respiratory: Positive for shortness of breath (at times).   Cardiovascular: Positive for leg swelling. Negative for chest pain and palpitations.  Musculoskeletal: Positive for arthralgias (left shopulder and arm).  Neurological: Negative.   Psychiatric/Behavioral: Negative.        Objective:   Physical Exam  Constitutional: She appears well-developed and well-nourished.  Musculoskeletal:  Lower ext erythema improving- 1+ pitting edema bil lower ext with increasing edema to 2+ bil knees and above. Palpable pedal pulses. Decrease ROM of left shoulder due to pain with any movement- diffuse point tenderness on palpation    BP 132/52  Pulse 55  Temp(Src) 97.2 F (36.2 C) (Oral)  Ht 5\' 1"  (1.549 m)  Wt 138 lb (62.596 kg)  BMI 26.09 kg/m2  EKG- Atrial fib with slow ventricular response     Assessment & Plan:   1. Left arm pain   2. Peripheral edema    Meds ordered this encounter  Medications  . nebivolol (BYSTOLIC) 10 MG tablet    Sig: Take 1 tablet (10 mg total) by mouth daily.    Dispense:  30 tablet    Refill:  5    Order Specific Question:  Supervising Provider    Answer:  Ernestina Penna [[id number]]  . metolazone (ZAROXOLYN) 2.5 MG tablet    Sig: Take 1 tablet (2.5 mg total) by mouth daily. x2 days then 2 days    Dispense:  10 tablet    Refill:  0    Order Specific Question:  Supervising Provider    Answer:  Christell Constant, DONALD W [[id number]]    Stopped amlodipine and increased bystolic Zaroxlyn as ordered Follow up next week Rest left arm- moist heat- tylenol OTC  Mary-Margaret Daphine Deutscher, FNP

## 2013-11-13 ENCOUNTER — Encounter: Payer: Self-pay | Admitting: Nurse Practitioner

## 2013-11-13 ENCOUNTER — Encounter (INDEPENDENT_AMBULATORY_CARE_PROVIDER_SITE_OTHER): Payer: Self-pay

## 2013-11-13 ENCOUNTER — Ambulatory Visit (INDEPENDENT_AMBULATORY_CARE_PROVIDER_SITE_OTHER): Payer: Medicare Other | Admitting: Nurse Practitioner

## 2013-11-13 VITALS — BP 133/53 | HR 52 | Temp 97.0°F | Ht 62.0 in | Wt 132.0 lb

## 2013-11-13 DIAGNOSIS — R609 Edema, unspecified: Secondary | ICD-10-CM

## 2013-11-13 DIAGNOSIS — R600 Localized edema: Secondary | ICD-10-CM

## 2013-11-13 NOTE — Progress Notes (Signed)
   Subjective:    Patient ID: Robin [name], female    DOB: 1928-07-05, 79 y.o.   MRN: [id number]  HPI Patient in today for recheck of peripheral edema- we started her on zaroxlyn last Friday an dshe is here today for recheck.    Review of Systems  Constitutional: Negative.   HENT: Negative.   Eyes: Negative.   Respiratory: Negative.   Cardiovascular: Negative.   Gastrointestinal: Negative.   Genitourinary: Negative.   Musculoskeletal: Negative.        Objective:   Physical Exam  Constitutional: She is oriented to person, place, and time. She appears well-developed and well-nourished.  Cardiovascular: Normal rate and normal heart sounds.   Pulmonary/Chest: Effort normal and breath sounds normal.  Musculoskeletal: She exhibits edema (2 + bil lower ext).  Erythematous  Lower ext- cool to touch  Neurological: She is alert and oriented to person, place, and time.   Weight down 6 lbs since last visit BP 133/53  Pulse 52  Temp(Src) 97 F (36.1 C) (Oral)  Ht 5\' 2"  (1.575 m)  Wt 132 lb (59.875 kg)  BMI 24.14 kg/m2        Assessment & Plan:   1. Peripheral edema    Continue zaroxlyn every 3rd day Elevate legs when sitting RTO in 1 week recheck  Mary-Margaret Daphine Deutscher, FNP

## 2013-11-13 NOTE — Patient Instructions (Signed)

## 2013-11-19 ENCOUNTER — Telehealth: Payer: Self-pay | Admitting: Nurse Practitioner

## 2013-11-19 NOTE — Telephone Encounter (Signed)
appt changed

## 2013-11-20 ENCOUNTER — Ambulatory Visit: Payer: Medicare Other | Admitting: Nurse Practitioner

## 2013-11-22 ENCOUNTER — Encounter: Payer: Self-pay | Admitting: Nurse Practitioner

## 2013-11-22 ENCOUNTER — Ambulatory Visit (INDEPENDENT_AMBULATORY_CARE_PROVIDER_SITE_OTHER): Payer: Medicare Other | Admitting: Nurse Practitioner

## 2013-11-22 VITALS — BP 136/44 | HR 55 | Temp 98.6°F | Ht 62.0 in | Wt 121.0 lb

## 2013-11-22 DIAGNOSIS — I872 Venous insufficiency (chronic) (peripheral): Secondary | ICD-10-CM

## 2013-11-22 DIAGNOSIS — I509 Heart failure, unspecified: Secondary | ICD-10-CM

## 2013-11-22 DIAGNOSIS — I878 Other specified disorders of veins: Secondary | ICD-10-CM

## 2013-11-22 MED ORDER — METOLAZONE 2.5 MG PO TABS: ORAL_TABLET | ORAL | Status: DC

## 2013-11-22 NOTE — Progress Notes (Signed)
   Subjective:    Patient ID: Robin [name], female    DOB: 1928/08/18, 79 y.o.   MRN: [id number]  HPI  Patient in today for follow up of peripheral edema. Patient has been taking zaroxlyn every 3rd day and that has resolved her edema and kept it under control. Not as SOB as she has beeneither.    Review of Systems  Constitutional: Negative.   HENT: Negative.   Respiratory: Negative.  Negative for cough and shortness of breath.   Cardiovascular: Negative.   Gastrointestinal: Negative.   Genitourinary: Negative.   Musculoskeletal: Negative.   Psychiatric/Behavioral: Negative.   All other systems reviewed and are negative.       Objective:   Physical Exam  Constitutional: She appears well-developed and well-nourished.  Cardiovascular: Normal rate, regular rhythm and normal heart sounds.   Pulmonary/Chest: Effort normal and breath sounds normal.  Skin: Skin is warm.   Weight down 11 lbs BP 136/44  Pulse 55  Temp(Src) 98.6 F (37 C) (Oral)  Ht 5\' 2"  (1.575 m)  Wt 121 lb (54.885 kg)  BMI 22.13 kg/m2        Assessment & Plan:   1. Venous stasis   2. CHF (congestive heart failure)    Weigh daiy- >2 lb gain in 24 hrs then take zaroxlyn Call if needed RTO prn and for routine follow up  Robin Daphine Deutscher, FNP

## 2013-11-22 NOTE — Patient Instructions (Signed)
Venous Stasis and Chronic Venous Insufficiency  As people age, the veins located in their legs may weaken and stretch. When veins weaken and lose the ability to pump blood effectively, the condition is called chronic venous insufficiency (CVI) or venous stasis. Almost all veins return blood back to the heart. This happens by:   The force of the heart pumping fresh blood pushes blood back to the heart.   Blood flowing to the heart from the force of gravity.  In the deep veins of the legs, blood has to fight gravity and flow upstream back to the heart. Here, the leg muscles contract to pump blood back toward the heart. Vein walls are elastic, and many veins have small valves that only allow blood to flow in one direction. When leg muscles contract, they push inward against the elastic vein walls. This squeezes blood upward, opens the valves, and moves blood toward the heart. When leg muscles relax, the vein wall also relaxes and the valves inside the vein close to prevent blood from flowing backward. This method of pumping blood out of the legs is called the venous pump.  CAUSES   The venous pump works best while walking and leg muscles are contracting. But when a person sits or stands, blood pressure in leg veins can build. Deep veins are usually able to withstand short periods of inactivity, but long periods of inactivity (and increased pressure) can stretch, weaken, and damage vein walls. High blood pressure can also stretch and damage vein walls. The veins may no longer be able to pump blood back to the heart. Venous hypertension (high blood pressure inside veins) that lasts over time is a primary cause of CVI. CVI can also be caused by:    Deep vein thrombosis, a condition where a thrombus (blood clot) blocks blood flow in a vein.   Phlebitis, an inflammation of a superficial vein that causes a blood clot to form.  Other risk factors for CVI may include:    Heredity.   Obesity.   Pregnancy.    Sedentary lifestyle.   Smoking.   Jobs requiring long periods of standing or sitting in one place.   Age and gender:   Women in their 40's and 50's and men in their 70's are more prone to developing CVI.  SYMPTOMS   Symptoms of CVI may include:    Varicose veins.   Ulceration or skin breakdown.   Lipodermatosclerosis, a condition that affects the skin just above the ankle, usually on the inside surface. Over time the skin becomes brown, smooth, tight and often painful. Those with this condition have a high risk of developing skin ulcers.   Reddened or discolored skin on the leg.   Swelling.  DIAGNOSIS   Your caregiver can diagnose CVI after performing a careful medical history and physical examination. To confirm the diagnosis, the following tests may also be ordered:    Duplex ultrasound.   Plethysmography (tests blood flow).   Venograms (x-ray using a special dye).  TREATMENT  The goals of treatment for CVI are to restore a person to an active life and to minimize pain or disability. Typically, CVI does not pose a serious threat to life or limb, and with proper treatment most people with this condition can continue to lead active lives. In most cases, mild CVI can be treated on an outpatient basis with simple procedures. Treatment methods include:    Elastic compression socks.   Sclerotherapy, a procedure involving an injection of   a material that "dissolves" the damaged veins. Other veins in the network of blood vessels take over the function of the damaged veins.   Vein stripping (an older procedure less commonly used).   Laser Ablation surgery.   Valve repair.  HOME CARE INSTRUCTIONS    Elastic compression socks must be worn every day. They can help with symptoms and lower the chances of the problem getting worse, but they do not cure the problem.   Only take over-the-counter or prescription medicines for pain, discomfort, or fever as directed by your caregiver.    Your caregiver will review your other medications with you.  SEEK MEDICAL CARE IF:    You are confused about how to take your medications.   There is redness, swelling, or increasing pain in the affected area.   There is a red streak or line that extends up or down from the affected area.   There is a breakdown or loss of skin in the affected area, even if the breakdown is small.   You develop an unexplained oral temperature above 102 F (38.9 C).   There is an injury to the affected area.  SEEK IMMEDIATE MEDICAL CARE IF:    There is an injury and open wound to the affected area.   Pain is not adequately relieved with pain medication prescribed or becomes severe.   An oral temperature above 102 F (38.9 C) develops.   The foot/ankle below the affected area becomes suddenly numb or the area feels weak and hard to move.  MAKE SURE YOU:    Understand these instructions.   Will watch your condition.   Will get help right away if you are not doing well or get worse.  Document Released: 03/27/2007 Document Revised: 02/13/2012 Document Reviewed: 06/04/2007  [employer] Patient Information 2014 [employer]

## 2013-11-23 LAB — BMP8+EGFR
BUN: 65 mg/dL — ABNORMAL HIGH (ref 8–27)
CO2: 25 mmol/L (ref 18–29)
Calcium: 9.4 mg/dL (ref 8.6–10.2)
Creatinine, Ser: 2.14 mg/dL — ABNORMAL HIGH (ref 0.57–1.00)
GFR calc Af Amer: 24 mL/min/{1.73_m2} — ABNORMAL LOW (ref >59)
GFR calc non Af Amer: 21 mL/min/{1.73_m2} — ABNORMAL LOW (ref >59)
Glucose: 250 mg/dL — ABNORMAL HIGH (ref 65–99)
Sodium: 142 mmol/L (ref 134–144)

## 2013-12-02 ENCOUNTER — Other Ambulatory Visit: Payer: Self-pay | Admitting: *Deleted

## 2013-12-02 MED ORDER — CLOPIDOGREL BISULFATE 75 MG PO TABS: 75.0000 mg | ORAL_TABLET | Freq: Every day | ORAL | Status: DC

## 2013-12-02 MED ORDER — GLIMEPIRIDE 4 MG PO TABS: ORAL_TABLET | ORAL | Status: DC

## 2013-12-09 ENCOUNTER — Other Ambulatory Visit: Payer: Self-pay

## 2013-12-09 NOTE — Telephone Encounter (Signed)
X °

## 2014-01-14 ENCOUNTER — Ambulatory Visit (INDEPENDENT_AMBULATORY_CARE_PROVIDER_SITE_OTHER): Payer: Medicare Other | Admitting: Cardiovascular Disease

## 2014-01-14 ENCOUNTER — Encounter: Payer: Self-pay | Admitting: Cardiovascular Disease

## 2014-01-14 VITALS — BP 144/84 | HR 56 | Ht 61.0 in | Wt 122.0 lb

## 2014-01-14 DIAGNOSIS — I4891 Unspecified atrial fibrillation: Secondary | ICD-10-CM

## 2014-01-14 DIAGNOSIS — I38 Endocarditis, valve unspecified: Secondary | ICD-10-CM

## 2014-01-14 DIAGNOSIS — I5032 Chronic diastolic (congestive) heart failure: Secondary | ICD-10-CM

## 2014-01-14 DIAGNOSIS — I6529 Occlusion and stenosis of unspecified carotid artery: Secondary | ICD-10-CM

## 2014-01-14 DIAGNOSIS — I6523 Occlusion and stenosis of bilateral carotid arteries: Secondary | ICD-10-CM

## 2014-01-14 DIAGNOSIS — I480 Paroxysmal atrial fibrillation: Secondary | ICD-10-CM

## 2014-01-14 DIAGNOSIS — I2510 Atherosclerotic heart disease of native coronary artery without angina pectoris: Secondary | ICD-10-CM

## 2014-01-14 DIAGNOSIS — I658 Occlusion and stenosis of other precerebral arteries: Secondary | ICD-10-CM

## 2014-01-14 DIAGNOSIS — I10 Essential (primary) hypertension: Secondary | ICD-10-CM

## 2014-01-14 DIAGNOSIS — I2789 Other specified pulmonary heart diseases: Secondary | ICD-10-CM

## 2014-01-14 DIAGNOSIS — I272 Pulmonary hypertension, unspecified: Secondary | ICD-10-CM

## 2014-01-14 NOTE — Patient Instructions (Signed)
Continue all current medications. Your physician wants you to follow up in: 6 months.  You will receive a reminder letter in the mail one-two months in advance.  If you don't receive a letter, please call our office to schedule the follow up appointment   

## 2014-01-14 NOTE — Progress Notes (Signed)
Patient ID: Robin [name], female   DOB: 11-Jun-1928, 80 y.o.   MRN: [id number]      SUBJECTIVE: Robin [name] has a h/o chronic diastolic heart failure, CAD (stent in 2011), atrial fibrillation, carotid artery stenosis, and valvular heart disease.  Her most recent echo in March 2013 revealed normal LV systolic function, EF 23-55%, diastolic dysfunction showing restrictive physiology, elevated LVEDP, moderate MR, severe TR, severe pulmonary hypertension (63 mmHg), a small pericardial effusion, and a dilated IVC.  She denies chest pain, worsening shortness of breath from her baseline, weight gain, and feels very well overall. She takes daily torsemide and prn metolazone for leg swelling, and tells me that she had significant leg swelling most recently in December. 60-79% RICA and 73-22% LICA stenosis in 0/2542.    No Known Allergies  Current Outpatient Prescriptions  Medication Sig Dispense Refill  . aspirin 81 MG tablet Take 81 mg by mouth daily.      Marland Kitchen atorvastatin (LIPITOR) 40 MG tablet Take 1 tablet (40 mg total) by mouth daily.  30 tablet  5  . cholecalciferol (VITAMIN D) 1000 UNITS tablet Take 1,000 Units by mouth daily.      . Choline Fenofibrate (TRILIPIX) 135 MG capsule Take 1 capsule (135 mg total) by mouth daily.  30 capsule  5  . clopidogrel (PLAVIX) 75 MG tablet Take 1 tablet (75 mg total) by mouth daily.  30 tablet  2  . glimepiride (AMARYL) 4 MG tablet One tablet daily  30 tablet  1  . Insulin Glargine (LANTUS SOLOSTAR) 100 UNIT/ML SOPN Inject 30 Units into the skin daily. Sliding scale  5 pen  11  . Insulin Pen Needle (BD PEN NEEDLE NANO U/F) 32G X 4 MM MISC Inject 1 each into the skin daily.  100 each  11  . linagliptin (TRADJENTA) 5 MG TABS tablet Take 1 tablet (5 mg total) by mouth daily. One daily  30 tablet  5  . metolazone (ZAROXOLYN) 2.5 MG tablet 1 tablet q 3 days prn  30 tablet  0  . nebivolol (BYSTOLIC) 10 MG tablet Take 1 tablet (10 mg total) by mouth daily.  30 tablet   5  . nitroGLYCERIN (NITROSTAT) 0.4 MG SL tablet Place 1 tablet (0.4 mg total) under the tongue every 5 (five) minutes as needed.  25 tablet  3  . potassium chloride (MICRO-K) 10 MEQ CR capsule Take 1 capsule (10 mEq total) by mouth 2 (two) times daily.  60 capsule  1  . torsemide (DEMADEX) 20 MG tablet 2 po qam and one po in afternoon  180 tablet  1   No current facility-administered medications for this visit.    Past Medical History  Diagnosis Date  . Respiratory distress   . Chronic renal insufficiency   . Hypertension   . Hypokalemia   . DM (diabetes mellitus)   . Myocardial infarction     NSTEMI/DES LAD, 02/2010  . Shortness of breath   . Cancer     skin cancer  . Chronic diastolic heart failure     EF 60-65%, echo, 02/2012  . Valvular heart disease     Moderate MR; severe TR, echo, 02/2012  . Pulmonary hypertension     Severe (RVSP 63 mmHg), echo, 02/2012  . CAD (coronary artery disease)   . Carotid artery disease     70-62% RICA; 37% LICA, 05/2830  . HLD (hyperlipidemia)   . Atrial fibrillation   . Peripheral edema  Past Surgical History  Procedure Laterality Date  . Total abdominal hysterectomy    . Cholecystectomy    . Cardiac catheterization      with stent 2011  . Coronary stent placement  2011  . Cataract extraction w/phaco  08/29/2011    Procedure: CATARACT EXTRACTION PHACO AND INTRAOCULAR LENS PLACEMENT (IOC);  Surgeon: Tonny Branch;  Location: [facility];  Service: Ophthalmology;  Laterality: Right;  CDE: 10.25  . Eye surgery  Sept. 2012    right KPE w/ IOL  . Cataract extraction w/phaco  09/08/2011    Procedure: CATARACT EXTRACTION PHACO AND INTRAOCULAR LENS PLACEMENT (IOC);  Surgeon: Tonny Branch;  Location: [facility];  Service: Ophthalmology;  Laterality: Left;  CDE: 10.77    History   Social History  . Marital Status: Married    Spouse Name: N/A    Number of Children: N/A  . Years of Education: N/A   Occupational History  . Retired    Social History  Main Topics  . Smoking status: Never Smoker   . Smokeless tobacco: Never Used  . Alcohol Use: No  . Drug Use: No  . Sexual Activity: Not on file   Other Topics Concern  . Not on file   Social History Narrative  . No narrative on file     Filed Vitals:   01/14/14 1409  BP: 144/84  Pulse: 56  Height: 5\' 1"  (1.549 m)  Weight: 122 lb (55.339 kg)    PHYSICAL EXAM General: NAD Neck: No JVD, no thyromegaly or thyroid nodule.  Lungs: Clear to auscultation bilaterally with normal respiratory effort. CV: Nondisplaced PMI.  Heart regular S1/split S2, no S3/S4, no murmur.  Trace peripheral leg edema. Normal pedal pulses.  Abdomen: Soft, nontender, no hepatosplenomegaly, no distention.  Neurologic: Alert and oriented x 3.  Psych: Normal affect. Extremities: No clubbing or cyanosis.   ECG: reviewed and available in electronic records.      ASSESSMENT AND PLAN:  Chronic diastolic heart failure  Euvolemic and stable. No change to present diuretic regimen.  Paroxysmal Atrial fibrillation  Currently in a regular rhythm. Will continue same dose of Bystolic and ASA/Plavix therapy for stroke prophylaxis, given that she has previously declined Coumadin anticoagulation.   CAD (coronary artery disease)  Quiescent on current medication regimen   Carotid artery disease  60-79% RICA and 39-03% LICA stenosis in 0/0923. Will repeat Dopplers in 6 months.  Valvular heart disease  Symptomatically stable. Will consider a followup echocardiogram at time of next OV.   Hypertension Well controlled on current therapy.     Kate Sable, M.D., F.A.C.C.

## 2014-02-06 ENCOUNTER — Other Ambulatory Visit: Payer: Self-pay | Admitting: Nurse Practitioner

## 2014-02-17 ENCOUNTER — Telehealth: Payer: Self-pay | Admitting: Nurse Practitioner

## 2014-02-17 NOTE — Telephone Encounter (Signed)
Patient aware to pick up 

## 2014-02-27 ENCOUNTER — Other Ambulatory Visit: Payer: Self-pay | Admitting: Nurse Practitioner

## 2014-03-13 ENCOUNTER — Other Ambulatory Visit: Payer: Self-pay | Admitting: Nurse Practitioner

## 2014-03-24 ENCOUNTER — Ambulatory Visit (INDEPENDENT_AMBULATORY_CARE_PROVIDER_SITE_OTHER): Payer: Medicare Other | Admitting: Nurse Practitioner

## 2014-03-24 ENCOUNTER — Encounter: Payer: Self-pay | Admitting: Nurse Practitioner

## 2014-03-24 VITALS — BP 188/53 | HR 48 | Temp 98.0°F | Ht 61.0 in | Wt 123.0 lb

## 2014-03-24 DIAGNOSIS — I2789 Other specified pulmonary heart diseases: Secondary | ICD-10-CM

## 2014-03-24 DIAGNOSIS — E119 Type 2 diabetes mellitus without complications: Secondary | ICD-10-CM

## 2014-03-24 DIAGNOSIS — I340 Nonrheumatic mitral (valve) insufficiency: Secondary | ICD-10-CM

## 2014-03-24 DIAGNOSIS — E559 Vitamin D deficiency, unspecified: Secondary | ICD-10-CM

## 2014-03-24 DIAGNOSIS — I10 Essential (primary) hypertension: Secondary | ICD-10-CM

## 2014-03-24 DIAGNOSIS — N189 Chronic kidney disease, unspecified: Secondary | ICD-10-CM

## 2014-03-24 DIAGNOSIS — I059 Rheumatic mitral valve disease, unspecified: Secondary | ICD-10-CM

## 2014-03-24 DIAGNOSIS — I4891 Unspecified atrial fibrillation: Secondary | ICD-10-CM

## 2014-03-24 DIAGNOSIS — E785 Hyperlipidemia, unspecified: Secondary | ICD-10-CM

## 2014-03-24 DIAGNOSIS — I272 Pulmonary hypertension, unspecified: Secondary | ICD-10-CM

## 2014-03-24 DIAGNOSIS — I2510 Atherosclerotic heart disease of native coronary artery without angina pectoris: Secondary | ICD-10-CM

## 2014-03-24 DIAGNOSIS — I509 Heart failure, unspecified: Secondary | ICD-10-CM

## 2014-03-24 DIAGNOSIS — E782 Mixed hyperlipidemia: Secondary | ICD-10-CM

## 2014-03-24 DIAGNOSIS — E876 Hypokalemia: Secondary | ICD-10-CM

## 2014-03-24 LAB — POCT UA - MICROALBUMIN: Microalbumin Ur, POC: 20 mg/L

## 2014-03-24 LAB — POCT GLYCOSYLATED HEMOGLOBIN (HGB A1C): HEMOGLOBIN A1C: 7.2

## 2014-03-24 MED ORDER — LINAGLIPTIN 5 MG PO TABS: ORAL_TABLET | ORAL | Status: DC

## 2014-03-24 MED ORDER — TORSEMIDE 20 MG PO TABS: ORAL_TABLET | ORAL | Status: DC

## 2014-03-24 MED ORDER — NEBIVOLOL HCL 10 MG PO TABS: 10.0000 mg | ORAL_TABLET | Freq: Every day | ORAL | Status: DC

## 2014-03-24 MED ORDER — CHOLINE FENOFIBRATE 135 MG PO CPDR: 135.0000 mg | DELAYED_RELEASE_CAPSULE | Freq: Every day | ORAL | Status: DC

## 2014-03-24 MED ORDER — GLIMEPIRIDE 4 MG PO TABS: ORAL_TABLET | ORAL | Status: DC

## 2014-03-24 MED ORDER — CLOPIDOGREL BISULFATE 75 MG PO TABS: 75.0000 mg | ORAL_TABLET | Freq: Every day | ORAL | Status: DC

## 2014-03-24 MED ORDER — INSULIN PEN NEEDLE 32G X 4 MM MISC: 1.0000 | Freq: Every day | Status: DC

## 2014-03-24 MED ORDER — INSULIN GLARGINE 100 UNIT/ML SOLOSTAR PEN: 30.0000 [IU] | PEN_INJECTOR | Freq: Every day | SUBCUTANEOUS | Status: DC

## 2014-03-24 MED ORDER — ATORVASTATIN CALCIUM 40 MG PO TABS: ORAL_TABLET | ORAL | Status: DC

## 2014-03-24 MED ORDER — POTASSIUM CHLORIDE ER 10 MEQ PO TBCR: EXTENDED_RELEASE_TABLET | ORAL | Status: DC

## 2014-03-24 NOTE — Addendum Note (Signed)
Addended by: Earlene Plater on: 03/24/2014 12:16 PM   Modules accepted: Orders

## 2014-03-24 NOTE — Progress Notes (Signed)
Subjective:    Patient ID: Robin [name], female    DOB: 16-Aug-1928, 80 y.o.   MRN: [id number]  Hypertension This is a chronic problem. The current episode started more than 1 year ago. The problem has been waxing and waning since onset. The problem is uncontrolled. Pertinent negatives include no anxiety, blurred vision, chest pain, palpitations or peripheral edema. Risk factors for coronary artery disease include diabetes mellitus, dyslipidemia and post-menopausal state. Past treatments include calcium channel blockers and beta blockers. The current treatment provides mild improvement.  Hyperlipidemia This is a chronic problem. The current episode started more than 1 year ago. The problem is controlled. Recent lipid tests were reviewed and are normal. Exacerbating diseases include diabetes. Pertinent negatives include no chest pain. The current treatment provides mild improvement of lipids. Risk factors for coronary artery disease include diabetes mellitus, dyslipidemia and post-menopausal.  Diabetes She presents for her follow-up diabetic visit. She has type 2 diabetes mellitus. Her disease course has been fluctuating. There are no hypoglycemic associated symptoms. Associated symptoms include fatigue and weakness. Pertinent negatives for diabetes include no blurred vision, no chest pain, no foot ulcerations and no visual change. There are no hypoglycemic complications. Pertinent negatives for hypoglycemia complications include no blackouts. Symptoms are stable. Pertinent negatives for diabetic complications include no peripheral neuropathy. Risk factors for coronary artery disease include diabetes mellitus and dyslipidemia. Current diabetic treatment includes insulin injections (daughter increased lantus to 20 units). She is compliant with treatment some of the time. She is currently taking insulin at bedtime. Insulin injections are given by adult caretaker. Rotation sites for injection include the  abdominal wall. Her weight is fluctuating minimally. She is following a generally unhealthy diet. When asked about meal planning, she reported none. She has not had a previous visit with a dietician. She rarely participates in exercise. Her home blood glucose trend is increasing steadily. Her breakfast blood glucose is taken between 6-7 am. Her breakfast blood glucose range is generally 140-180 mg/dl. An ACE inhibitor/angiotensin II receptor blocker is not being taken. She sees a podiatrist (April 2014).Eye exam is not current (pt states around a year and half).  Hypokalemia Potassium supplements daily- No c/o lower ext cramps    Review of Systems  Constitutional: Positive for fatigue.  Eyes: Negative for blurred vision.  Cardiovascular: Negative for chest pain and palpitations.  Neurological: Positive for weakness.  All other systems reviewed and are negative. BP 188/53  Pulse 48  Temp(Src) 98 F (36.7 C) (Oral)  Ht '5\' 1"'  (1.549 m)  Wt 123 lb (55.792 kg)  BMI 23.25 kg/m2      Objective:   Physical Exam  Vitals reviewed. Constitutional: She is oriented to person, place, and time. She appears well-developed and well-nourished.  HENT:  Head: Normocephalic.  Eyes: Pupils are equal, round, and reactive to light.  Neck: Normal range of motion. No JVD present. No thyromegaly present.  Cardiovascular: Normal rate.   Bradycardia   Pulmonary/Chest: Effort normal.  Diminished Breath sounds bilaterally   Abdominal: Soft. She exhibits no mass. There is no tenderness. There is no rebound.  Musculoskeletal: Normal range of motion. She exhibits no edema.  Neurological: She is alert and oriented to person, place, and time.  Skin: Skin is warm and dry.  Moles on face with ecchymosis on bilateral arms   Psychiatric: She has a normal mood and affect. Her behavior is normal.   BP 188/53  Pulse 48  Temp(Src) 98 F (36.7 C) (Oral)  Ht 5'  1" (1.549 m)  Wt 123 lb (55.792 kg)  BMI 23.25  kg/m2 Results for orders placed in visit on 03/24/14  POCT GLYCOSYLATED HEMOGLOBIN (HGB A1C)      Result Value Ref Range   Hemoglobin A1C 7.2          Assessment & Plan:   1. DM   2. Hyperlipemia   3. Hypertension   4. Pulmonary hypertension   5. Vitamin D deficiency   6. Mitral regurgitation   7. Hypopotassemia   8. Chronic renal insufficiency   9. CHF   10. CAD (coronary artery disease)   11. Atrial fibrillation   12. Elevated triglycerides with high cholesterol    Orders Placed This Encounter  Procedures  . CMP14+EGFR  . NMR, lipoprofile  . POCT glycosylated hemoglobin (Hb A1C)   Meds ordered this encounter  Medications  . Choline Fenofibrate (TRILIPIX) 135 MG capsule    Sig: Take 1 capsule (135 mg total) by mouth daily.    Dispense:  30 capsule    Refill:  5    Order Specific Question:  Supervising Provider    Answer:  Chipper Herb [[id number]]  . torsemide (DEMADEX) 20 MG tablet    Sig: 2 po qam and one po in afternoon    Dispense:  90 tablet    Refill:  5    Order Specific Question:  Supervising Provider    Answer:  Chipper Herb [[id number]]  . atorvastatin (LIPITOR) 40 MG tablet    Sig: TAKE ONE (1) TABLET EACH DAY    Dispense:  30 tablet    Refill:  5    Order Specific Question:  Supervising Provider    Answer:  Chipper Herb [[id number]]  . clopidogrel (PLAVIX) 75 MG tablet    Sig: Take 1 tablet (75 mg total) by mouth daily.    Dispense:  30 tablet    Refill:  5    Order Specific Question:  Supervising Provider    Answer:  Chipper Herb [[id number]]  . glimepiride (AMARYL) 4 MG tablet    Sig: TAKE ONE (1) TABLET EACH DAY    Dispense:  30 tablet    Refill:  5    Order Specific Question:  Supervising Provider    Answer:  Chipper Herb [[id number]]  . Insulin Glargine (LANTUS SOLOSTAR) 100 UNIT/ML Solostar Pen    Sig: Inject 30 Units into the skin daily. Sliding scale    Dispense:  5 pen    Refill:  11    Order Specific Question:  Supervising Provider    Answer:   Chipper Herb [[id number]]  . nebivolol (BYSTOLIC) 10 MG tablet    Sig: Take 1 tablet (10 mg total) by mouth daily.    Dispense:  30 tablet    Refill:  5    Order Specific Question:  Supervising Provider    Answer:  Chipper Herb [[id number]]  . potassium chloride (K-DUR) 10 MEQ tablet    Sig: TAKE ONE CAPSULE BY MOUTH TWICE A DAY    Dispense:  60 tablet    Refill:  5    Order Specific Question:  Supervising Provider    Answer:  Chipper Herb [[id number]]  . linagliptin (TRADJENTA) 5 MG TABS tablet    Sig: TAKE ONE (1) TABLET EACH DAY    Dispense:  30 tablet    Refill:  5    Order Specific Question:  Supervising Provider    Answer:  Redge Gainer  [place] pending Health maintenance reviewed Diet and exercise encouraged Continue all meds Follow up  In 3 months   [place], FNP

## 2014-03-24 NOTE — Patient Instructions (Signed)

## 2014-03-25 LAB — NMR, LIPOPROFILE
Cholesterol: 141 mg/dL (ref <200)
HDL Cholesterol by NMR: 34 mg/dL — ABNORMAL LOW (ref >=40)
HDL Particle Number: 30.4 umol/L — ABNORMAL LOW (ref >=30.5)
LDL Particle Number: 1147 nmol/L — ABNORMAL HIGH (ref <1000)
LDL Size: 20.4 nm (ref >20.5)
LDLC SERPL CALC-MCNC: 65 mg/dL (ref <100)
LP-IR Score: 75 — ABNORMAL HIGH (ref <=45)
SMALL LDL PARTICLE NUMBER: 484 nmol/L (ref <=527)
Triglycerides by NMR: 209 mg/dL — ABNORMAL HIGH (ref <150)

## 2014-03-25 LAB — CMP14+EGFR
ALT: 16 IU/L (ref 0–32)
AST: 19 IU/L (ref 0–40)
Albumin/Globulin Ratio: 1.5 (ref 1.1–2.5)
Albumin: 3.8 g/dL (ref 3.5–4.7)
Alkaline Phosphatase: 53 IU/L (ref 39–117)
BUN/Creatinine Ratio: 21 (ref 11–26)
BUN: 42 mg/dL — ABNORMAL HIGH (ref 8–27)
CALCIUM: 9.7 mg/dL (ref 8.7–10.3)
CO2: 23 mmol/L (ref 18–29)
Chloride: 103 mmol/L (ref 97–108)
Creatinine, Ser: 2 mg/dL — ABNORMAL HIGH (ref 0.57–1.00)
GFR calc Af Amer: 26 mL/min/{1.73_m2} — ABNORMAL LOW (ref >59)
GFR calc non Af Amer: 22 mL/min/{1.73_m2} — ABNORMAL LOW (ref >59)
Globulin, Total: 2.5 g/dL (ref 1.5–4.5)
Glucose: 206 mg/dL — ABNORMAL HIGH (ref 65–99)
POTASSIUM: 4.2 mmol/L (ref 3.5–5.2)
SODIUM: 145 mmol/L — AB (ref 134–144)
Total Bilirubin: 0.3 mg/dL (ref 0.0–1.2)
Total Protein: 6.3 g/dL (ref 6.0–8.5)

## 2014-03-25 LAB — MICROALBUMIN, URINE: MICROALBUM., U, RANDOM: 26.1 ug/mL — AB (ref 0.0–17.0)

## 2014-03-28 ENCOUNTER — Other Ambulatory Visit: Payer: Self-pay | Admitting: Nurse Practitioner

## 2014-03-28 MED ORDER — LISINOPRIL 20 MG PO TABS: 20.0000 mg | ORAL_TABLET | Freq: Every day | ORAL | Status: DC

## 2014-03-31 ENCOUNTER — Telehealth: Payer: Self-pay | Admitting: Nurse Practitioner

## 2014-03-31 NOTE — Telephone Encounter (Signed)
Already taken care of in result note

## 2014-04-04 ENCOUNTER — Encounter: Payer: Self-pay | Admitting: *Deleted

## 2014-04-24 ENCOUNTER — Other Ambulatory Visit: Payer: Self-pay | Admitting: *Deleted

## 2014-04-24 DIAGNOSIS — I779 Disorder of arteries and arterioles, unspecified: Secondary | ICD-10-CM

## 2014-04-24 DIAGNOSIS — I739 Peripheral vascular disease, unspecified: Principal | ICD-10-CM

## 2014-05-15 ENCOUNTER — Encounter (INDEPENDENT_AMBULATORY_CARE_PROVIDER_SITE_OTHER): Payer: Medicare Other

## 2014-05-15 DIAGNOSIS — I6529 Occlusion and stenosis of unspecified carotid artery: Secondary | ICD-10-CM

## 2014-05-15 DIAGNOSIS — I739 Peripheral vascular disease, unspecified: Principal | ICD-10-CM

## 2014-05-15 DIAGNOSIS — I779 Disorder of arteries and arterioles, unspecified: Secondary | ICD-10-CM

## 2014-05-20 ENCOUNTER — Telehealth: Payer: Self-pay | Admitting: *Deleted

## 2014-05-20 NOTE — Telephone Encounter (Signed)
Patient informed. 

## 2014-05-20 NOTE — Telephone Encounter (Signed)
Message copied by Merlene Laughter on Tue May 20, 2014  4:16 PM ------      Message from: Kate Sable A      Created: Fri May 16, 2014  4:50 PM       Mild bilateral disease. F/u 1 year. ------

## 2014-05-22 ENCOUNTER — Other Ambulatory Visit: Payer: Self-pay | Admitting: Nurse Practitioner

## 2014-05-26 ENCOUNTER — Telehealth: Payer: Self-pay | Admitting: Nurse Practitioner

## 2014-05-28 MED ORDER — NEBIVOLOL HCL 10 MG PO TABS: 10.0000 mg | ORAL_TABLET | Freq: Every day | ORAL | Status: DC

## 2014-05-28 NOTE — Telephone Encounter (Signed)
Samples given #28. Daughter notified

## 2014-05-29 ENCOUNTER — Telehealth: Payer: Self-pay | Admitting: *Deleted

## 2014-05-29 NOTE — Telephone Encounter (Signed)
Spoke directly with patient because daughter was unavailable.  Explained that she can check with [place] to see if samples are available next month.

## 2014-05-29 NOTE — Telephone Encounter (Signed)
Gave patient samples of bystolic yesterday. Daughter states that insurance will not pay for it. Wants to know if you think it should be changed to something that insurance will cover since we wont have the usual samples that we usually do. Please advise

## 2014-05-29 NOTE — Telephone Encounter (Signed)
Heart rate is low at 48- if we change to a different med in class it is going to drop her heart rate.

## 2014-06-09 ENCOUNTER — Telehealth: Payer: Self-pay | Admitting: Nurse Practitioner

## 2014-06-10 NOTE — Telephone Encounter (Signed)
Patient aware.

## 2014-06-12 ENCOUNTER — Telehealth: Payer: Self-pay | Admitting: Nurse Practitioner

## 2014-06-12 MED ORDER — NEBIVOLOL HCL 10 MG PO TABS: 10.0000 mg | ORAL_TABLET | Freq: Every day | ORAL | Status: DC

## 2014-06-12 NOTE — Telephone Encounter (Signed)
Patient aware med's up front

## 2014-06-30 ENCOUNTER — Telehealth: Payer: Self-pay | Admitting: Nurse Practitioner

## 2014-06-30 MED ORDER — NEBIVOLOL HCL 10 MG PO TABS: 10.0000 mg | ORAL_TABLET | Freq: Every day | ORAL | Status: DC

## 2014-06-30 NOTE — Telephone Encounter (Signed)
Patient aware they are up front.

## 2014-07-09 ENCOUNTER — Ambulatory Visit: Payer: Medicare Other | Admitting: Nurse Practitioner

## 2014-07-10 ENCOUNTER — Ambulatory Visit: Payer: Medicare Other | Admitting: Nurse Practitioner

## 2014-07-14 ENCOUNTER — Ambulatory Visit (INDEPENDENT_AMBULATORY_CARE_PROVIDER_SITE_OTHER): Payer: Medicare Other | Admitting: Nurse Practitioner

## 2014-07-14 ENCOUNTER — Encounter: Payer: Self-pay | Admitting: Nurse Practitioner

## 2014-07-14 VITALS — BP 178/63 | HR 50 | Temp 98.0°F | Ht 61.0 in | Wt 120.8 lb

## 2014-07-14 DIAGNOSIS — R609 Edema, unspecified: Secondary | ICD-10-CM

## 2014-07-14 DIAGNOSIS — E119 Type 2 diabetes mellitus without complications: Secondary | ICD-10-CM

## 2014-07-14 DIAGNOSIS — N181 Chronic kidney disease, stage 1: Secondary | ICD-10-CM

## 2014-07-14 DIAGNOSIS — I2510 Atherosclerotic heart disease of native coronary artery without angina pectoris: Secondary | ICD-10-CM

## 2014-07-14 DIAGNOSIS — E559 Vitamin D deficiency, unspecified: Secondary | ICD-10-CM

## 2014-07-14 DIAGNOSIS — I509 Heart failure, unspecified: Secondary | ICD-10-CM

## 2014-07-14 DIAGNOSIS — I272 Pulmonary hypertension, unspecified: Secondary | ICD-10-CM

## 2014-07-14 DIAGNOSIS — E785 Hyperlipidemia, unspecified: Secondary | ICD-10-CM

## 2014-07-14 DIAGNOSIS — I10 Essential (primary) hypertension: Secondary | ICD-10-CM

## 2014-07-14 DIAGNOSIS — I2789 Other specified pulmonary heart diseases: Secondary | ICD-10-CM

## 2014-07-14 LAB — POCT GLYCOSYLATED HEMOGLOBIN (HGB A1C): Hemoglobin A1C: 6.8

## 2014-07-14 NOTE — Patient Instructions (Signed)

## 2014-07-14 NOTE — Progress Notes (Signed)
Subjective:    Patient ID: Robin [name], female    DOB: 1928-04-06, 80 y.o.   MRN: [id number]  Patient here today for follow up of chronic medical problems. Patient says that her stomach feels bad today but thinks that it is something she ate yesterday.   Hypertension This is a chronic problem. The current episode started more than 1 year ago. The problem has been waxing and waning since onset. The problem is controlled (usually normal at home but have not een checking lately.). Pertinent negatives include no anxiety, blurred vision, chest pain, palpitations or peripheral edema. Risk factors for coronary artery disease include diabetes mellitus, dyslipidemia and post-menopausal state. Past treatments include calcium channel blockers and beta blockers. The current treatment provides mild improvement.  Hyperlipidemia This is a chronic problem. The current episode started more than 1 year ago. The problem is controlled. Recent lipid tests were reviewed and are normal. Exacerbating diseases include diabetes. Pertinent negatives include no chest pain. The current treatment provides mild improvement of lipids. Risk factors for coronary artery disease include diabetes mellitus, dyslipidemia and post-menopausal.  Diabetes She presents for her follow-up diabetic visit. She has type 2 diabetes mellitus. Her disease course has been fluctuating. There are no hypoglycemic associated symptoms. Associated symptoms include fatigue and weakness. Pertinent negatives for diabetes include no blurred vision, no chest pain, no foot ulcerations and no visual change. There are no hypoglycemic complications. Pertinent negatives for hypoglycemia complications include no blackouts. Symptoms are stable. Pertinent negatives for diabetic complications include no peripheral neuropathy. Risk factors for coronary artery disease include diabetes mellitus and dyslipidemia. Current diabetic treatment includes insulin injections (daughter  increased lantus to 20 units). She is compliant with treatment some of the time. She is currently taking insulin at bedtime. Insulin injections are given by adult caretaker. Rotation sites for injection include the abdominal wall. Her weight is fluctuating minimally. She is following a generally unhealthy diet. When asked about meal planning, she reported none. She has not had a previous visit with a dietician. She rarely participates in exercise. Her home blood glucose trend is increasing steadily. Her breakfast blood glucose is taken between 6-7 am. Her breakfast blood glucose range is generally 140-180 mg/dl. An ACE inhibitor/angiotensin II receptor blocker is not being taken. She sees a podiatrist (April 2014).Eye exam is not current (pt states around a year and half).  Hypokalemia Potassium supplements daily- No c/o lower ext cramps CAD No c/o chest pain or cramping of lower ext. Has not needed to take nitorglycerin CHF No recent swelling or SOB- has not needed to take zaroxolyn in awhile Atrial fib under control- does not c/o palpitations or heart racing.    Review of Systems  Constitutional: Positive for fatigue.  Eyes: Negative for blurred vision.  Cardiovascular: Negative for chest pain and palpitations.  Neurological: Positive for weakness.  All other systems reviewed and are negative. BP 178/63  Pulse 50  Temp(Src) 98 F (36.7 C) (Oral)  Ht _0  (1.549 m)  Wt 120 lb 12.8 oz (54.795 kg)  BMI 22.84 kg/m2      Objective:   Physical Exam  Vitals reviewed. Constitutional: She is oriented to person, place, and time. She appears well-developed and well-nourished.  HENT:  Head: Normocephalic.  Eyes: Pupils are equal, round, and reactive to light.  Neck: Normal range of motion. No JVD present. No thyromegaly present.  Cardiovascular: Normal rate.   Bradycardia   Pulmonary/Chest: Effort normal.  Diminished Breath sounds bilaterally   Abdominal:  Soft. She exhibits no mass.  There is no tenderness. There is no rebound.  Musculoskeletal: Normal range of motion. She exhibits no edema.  Neurological: She is alert and oriented to person, place, and time.  Skin: Skin is warm and dry.  Moles on face with ecchymosis on bilateral arms   Psychiatric: She has a normal mood and affect. Her behavior is normal.   BP 178/63  Pulse 50  Temp(Src) 98 F (36.7 C) (Oral)  Ht _0  (1.549 m)  Wt 120 lb 12.8 oz (54.795 kg)  BMI 22.84 kg/m2 Results for orders placed in visit on 07/14/14  POCT GLYCOSYLATED HEMOGLOBIN (HGB A1C)      Result Value Ref Range   Hemoglobin A1C 6.8          Assessment & Plan:   1. DM   2. Hyperlipemia   3. Essential hypertension   4. Vitamin D deficiency   5. Pulmonary hypertension   6. Peripheral edema   7. CHF   8. Coronary artery disease involving native coronary artery of native heart without angina pectoris   9. Chronic renal insufficiency, stage 1    Orders Placed This Encounter  Procedures  . CMP14+EGFR  . NMR, lipoprofile  . POCT glycosylated hemoglobin (Hb A1C)   Refuses [facility] pending Health maintenance reviewed Diet and exercise encouraged Continue all meds Follow up  In 3 months   [place], FNP

## 2014-07-15 LAB — CMP14+EGFR
A/G RATIO: 1.4 (ref 1.1–2.5)
ALBUMIN: 3.8 g/dL (ref 3.5–4.7)
ALK PHOS: 46 IU/L (ref 39–117)
ALT: 18 IU/L (ref 0–32)
AST: 24 IU/L (ref 0–40)
BILIRUBIN TOTAL: 0.5 mg/dL (ref 0.0–1.2)
BUN / CREAT RATIO: 30 — AB (ref 11–26)
BUN: 51 mg/dL — AB (ref 8–27)
CO2: 22 mmol/L (ref 18–29)
CREATININE: 1.72 mg/dL — AB (ref 0.57–1.00)
Calcium: 10.3 mg/dL (ref 8.7–10.3)
Chloride: 108 mmol/L (ref 97–108)
GFR, EST AFRICAN AMERICAN: 31 mL/min/{1.73_m2} — AB (ref >59)
GFR, EST NON AFRICAN AMERICAN: 27 mL/min/{1.73_m2} — AB (ref >59)
GLOBULIN, TOTAL: 2.7 g/dL (ref 1.5–4.5)
Glucose: 114 mg/dL — ABNORMAL HIGH (ref 65–99)
Potassium: 4.9 mmol/L (ref 3.5–5.2)
Sodium: 146 mmol/L — ABNORMAL HIGH (ref 134–144)
Total Protein: 6.5 g/dL (ref 6.0–8.5)

## 2014-07-15 LAB — NMR, LIPOPROFILE
CHOLESTEROL: 161 mg/dL (ref 100–199)
HDL Cholesterol by NMR: 31 mg/dL — ABNORMAL LOW (ref >39)
HDL PARTICLE NUMBER: 31.5 umol/L (ref >=30.5)
LDL Particle Number: 1298 nmol/L — ABNORMAL HIGH (ref <1000)
LDL SIZE: 20.6 nm (ref >20.5)
LDLC SERPL CALC-MCNC: 90 mg/dL (ref 0–99)
LP-IR Score: 75 — ABNORMAL HIGH (ref <=45)
Small LDL Particle Number: 695 nmol/L — ABNORMAL HIGH (ref <=527)
Triglycerides by NMR: 202 mg/dL — ABNORMAL HIGH (ref 0–149)

## 2014-07-18 ENCOUNTER — Ambulatory Visit (INDEPENDENT_AMBULATORY_CARE_PROVIDER_SITE_OTHER): Payer: Medicare Other | Admitting: Cardiovascular Disease

## 2014-07-18 ENCOUNTER — Encounter: Payer: Self-pay | Admitting: Cardiovascular Disease

## 2014-07-18 VITALS — BP 173/76 | HR 54 | Ht 61.0 in | Wt 122.0 lb

## 2014-07-18 DIAGNOSIS — Z794 Long term (current) use of insulin: Secondary | ICD-10-CM

## 2014-07-18 DIAGNOSIS — I10 Essential (primary) hypertension: Secondary | ICD-10-CM

## 2014-07-18 DIAGNOSIS — Z7901 Long term (current) use of anticoagulants: Secondary | ICD-10-CM

## 2014-07-18 DIAGNOSIS — I2510 Atherosclerotic heart disease of native coronary artery without angina pectoris: Secondary | ICD-10-CM

## 2014-07-18 DIAGNOSIS — E785 Hyperlipidemia, unspecified: Secondary | ICD-10-CM

## 2014-07-18 DIAGNOSIS — I480 Paroxysmal atrial fibrillation: Secondary | ICD-10-CM

## 2014-07-18 DIAGNOSIS — I5032 Chronic diastolic (congestive) heart failure: Secondary | ICD-10-CM

## 2014-07-18 DIAGNOSIS — I6529 Occlusion and stenosis of unspecified carotid artery: Secondary | ICD-10-CM

## 2014-07-18 DIAGNOSIS — I38 Endocarditis, valve unspecified: Secondary | ICD-10-CM

## 2014-07-18 DIAGNOSIS — E119 Type 2 diabetes mellitus without complications: Secondary | ICD-10-CM

## 2014-07-18 DIAGNOSIS — Z5181 Encounter for therapeutic drug level monitoring: Secondary | ICD-10-CM

## 2014-07-18 DIAGNOSIS — IMO0001 Reserved for inherently not codable concepts without codable children: Secondary | ICD-10-CM

## 2014-07-18 DIAGNOSIS — I4891 Unspecified atrial fibrillation: Secondary | ICD-10-CM

## 2014-07-18 DIAGNOSIS — I658 Occlusion and stenosis of other precerebral arteries: Secondary | ICD-10-CM

## 2014-07-18 DIAGNOSIS — I6523 Occlusion and stenosis of bilateral carotid arteries: Secondary | ICD-10-CM

## 2014-07-18 MED ORDER — APIXABAN 2.5 MG PO TABS: 2.5000 mg | ORAL_TABLET | Freq: Two times a day (BID) | ORAL | Status: DC

## 2014-07-18 MED ORDER — NITROGLYCERIN 0.4 MG SL SUBL: 0.4000 mg | SUBLINGUAL_TABLET | SUBLINGUAL | Status: DC | PRN

## 2014-07-18 MED ORDER — LISINOPRIL 20 MG PO TABS: 20.0000 mg | ORAL_TABLET | Freq: Two times a day (BID) | ORAL | Status: DC

## 2014-07-18 NOTE — Patient Instructions (Addendum)
Your physician recommends that you schedule a follow-up appointment in: 6 months. You will receive a reminder letter in the mail in about 4 months reminding you to call and schedule your appointment. If you don't receive this letter, please contact our office. Your physician has recommended you make the following change in your medication:  Stop aspirin. Stop plavix. Start eliquis 2.5 mg by mouth twice daily. Increased lisinopril 20 mg to twice daily. Please follow up with Edrick Oh due to starting eliquis.

## 2014-07-18 NOTE — Progress Notes (Signed)
Patient ID: Robin [name], female   DOB: [birth year], 80 y.o.   MRN: [id number]      SUBJECTIVE: [name] presents for follow up of chronic diastolic heart failure, CAD (stent in 2011), atrial fibrillation, carotid artery stenosis, and valvular heart disease.  Her most recent echo in March 2013 revealed normal LV systolic function, EF 35-00%, diastolic dysfunction showing restrictive physiology, elevated LVEDP, moderate MR, severe TR, severe pulmonary hypertension (63 mmHg), a small pericardial effusion, and a dilated IVC. Most recent carotid Dopplers demonstrated mild bilateral disease.  She has been feeling well and seldom gets chest pain and exertional dyspnea. She denies palpitations. She also denies leg swelling.   Review of Systems: As per "subjective", otherwise negative.  No Known Allergies  Current Outpatient Prescriptions  Medication Sig Dispense Refill  . aspirin 81 MG tablet Take 81 mg by mouth daily.      Marland Kitchen atorvastatin (LIPITOR) 40 MG tablet TAKE ONE (1) TABLET EACH DAY  30 tablet  5  . cholecalciferol (VITAMIN D) 1000 UNITS tablet Take 1,000 Units by mouth daily.      . Choline Fenofibrate (TRILIPIX) 135 MG capsule Take 1 capsule (135 mg total) by mouth daily.  30 capsule  5  . clopidogrel (PLAVIX) 75 MG tablet Take 1 tablet (75 mg total) by mouth daily.  30 tablet  5  . glimepiride (AMARYL) 4 MG tablet TAKE ONE (1) TABLET EACH DAY  30 tablet  5  . Insulin Glargine (LANTUS SOLOSTAR) 100 UNIT/ML Solostar Pen Inject 30 Units into the skin daily. Sliding scale  5 pen  11  . Insulin Pen Needle (BD PEN NEEDLE NANO U/F) 32G X 4 MM MISC Inject 1 each into the skin daily.  100 each  11  . linagliptin (TRADJENTA) 5 MG TABS tablet TAKE ONE (1) TABLET EACH DAY  30 tablet  5  . lisinopril (PRINIVIL,ZESTRIL) 20 MG tablet Take 1 tablet (20 mg total) by mouth daily.  90 tablet  3  . metolazone (ZAROXOLYN) 2.5 MG tablet 1 tablet q 3 days prn  30 tablet  0  . nebivolol (BYSTOLIC) 10 MG  tablet Take 1 tablet (10 mg total) by mouth daily.  28 tablet  0  . nitroGLYCERIN (NITROSTAT) 0.4 MG SL tablet Place 1 tablet (0.4 mg total) under the tongue every 5 (five) minutes as needed.  25 tablet  3  . potassium chloride (K-DUR) 10 MEQ tablet TAKE ONE CAPSULE BY MOUTH TWICE A DAY  60 tablet  5  . torsemide (DEMADEX) 20 MG tablet TAKE 2 TABLETS EVERY MORNING AND TAKE 1 TABLET EVERY AFTERNOON  270 tablet  0   No current facility-administered medications for this visit.    Past Medical History  Diagnosis Date  . Respiratory distress   . Chronic renal insufficiency   . Hypertension   . Hypokalemia   . DM (diabetes mellitus)   . Myocardial infarction     NSTEMI/DES LAD, 02/2010  . Shortness of breath   . Cancer     skin cancer  . Chronic diastolic heart failure     EF 60-65%, echo, 02/2012  . Valvular heart disease     Moderate MR; severe TR, echo, 02/2012  . Pulmonary hypertension     Severe (RVSP 63 mmHg), echo, 02/2012  . CAD (coronary artery disease)   . Carotid artery disease     93-81% RICA; 82% LICA, 08/9370  . HLD (hyperlipidemia)   . Atrial fibrillation   . Peripheral  edema     Past Surgical History  Procedure Laterality Date  . Total abdominal hysterectomy    . Cholecystectomy    . Cardiac catheterization      with stent 2011  . Coronary stent placement  2011  . Cataract extraction w/phaco  08/29/2011    Procedure: CATARACT EXTRACTION PHACO AND INTRAOCULAR LENS PLACEMENT (IOC);  Surgeon: Tonny Branch;  Location: [facility];  Service: Ophthalmology;  Laterality: Right;  CDE: 10.25  . Eye surgery  Sept. 2012    right KPE w/ IOL  . Cataract extraction w/phaco  09/08/2011    Procedure: CATARACT EXTRACTION PHACO AND INTRAOCULAR LENS PLACEMENT (IOC);  Surgeon: Tonny Branch;  Location: [facility];  Service: Ophthalmology;  Laterality: Left;  CDE: 10.77    History   Social History  . Marital Status: Married    Spouse Name: N/A    Number of Children: N/A  . Years of Education:  N/A   Occupational History  . Retired    Social History Main Topics  . Smoking status: Never Smoker   . Smokeless tobacco: Never Used  . Alcohol Use: No  . Drug Use: No  . Sexual Activity: Not on file   Other Topics Concern  . Not on file   Social History Narrative  . No narrative on file     Filed Vitals:   07/18/14 1253  Height: 5\' 1"  (1.549 m)  Weight: 122 lb (55.339 kg)   BP 173/76  Pulse 54   PHYSICAL EXAM General: NAD  Neck: No JVD, no thyromegaly or thyroid nodule.  Lungs: Clear to auscultation bilaterally with normal respiratory effort.  CV: Nondisplaced PMI. Heart regular S1/split S2, no S3/S4, no murmur. Trace peripheral leg edema. Normal pedal pulses.  Abdomen: Soft, nontender, no hepatosplenomegaly, no distention.  Neurologic: Alert and oriented x 3.  Psych: Normal affect.  Extremities: No clubbing or cyanosis.    ECG: reviewed and available in electronic records.      ASSESSMENT AND PLAN:  Chronic diastolic heart failure  Euvolemic and stable. No change to present diuretic regimen.   Paroxysmal Atrial fibrillation  Currently in a regular rhythm. Will continue same dose of Bystolic. Given evidence from Active-W trial, will d/c ASA with Plavix for stroke prophylaxis. Given that she has previously declined Coumadin anticoagulation, will start a NOAC, apixaban 2.5 mg bid given age and body weight.  CAD (coronary artery disease)  Quiescent on current medication regimen. Given initiation of Eliquis, will d/c ASA and Plavix. Will refill SL nitro.  Carotid artery disease  Mild bilateral disease in June 2015. May be repeated in 2 years.   Valvular heart disease  Symptomatically stable.    Essential Hypertension  Uncontrolled on current therapy. Will increase lisinopril to 20 mg bid.  Hyperlipidemia Recently checked and relatively well controlled on Lipitor 40 mg.  IDDM Recent HbA1C 6.8% indicating good control for age.  Dispo: f/u 6  months.  Kate Sable, M.D., F.A.C.C.

## 2014-07-22 ENCOUNTER — Telehealth: Payer: Self-pay | Admitting: *Deleted

## 2014-07-22 MED ORDER — ASPIRIN EC 81 MG PO TBEC: 81.0000 mg | DELAYED_RELEASE_TABLET | Freq: Every day | ORAL | Status: AC

## 2014-07-22 MED ORDER — CLOPIDOGREL BISULFATE 75 MG PO TABS: 75.0000 mg | ORAL_TABLET | Freq: Every day | ORAL | Status: DC

## 2014-07-22 NOTE — Telephone Encounter (Signed)
Message left on voice mail - Medicare not paying for Eliquis.  Returned call - spoke with Jackelyn Poling (daughter).  She stated that her mom had decided to stay on medication already taking.  Stated Medicare not paying for Eliquis anyway.  Informed her that we could go through process of prior authorization if necessary to help get this for her.  Jackelyn Poling stated that she was doing well on what she was on previously & did not want to change at this time.  She has continued her Aspirin & Plavix as previously taking.  MD will be made aware.

## 2014-07-22 NOTE — Telephone Encounter (Signed)
Please see my note. I also explained to pt and family member that ASA and Plavix do not afford the protection for stroke prevention. Please contact Denice Bors re: medication assistance.

## 2014-07-25 NOTE — Telephone Encounter (Signed)
Daughter Jackelyn Poling) notified & is aware.  States that she still declines starting this medication.

## 2014-08-21 ENCOUNTER — Other Ambulatory Visit: Payer: Self-pay | Admitting: Nurse Practitioner

## 2014-08-22 NOTE — Telephone Encounter (Signed)
Looks like cardiologist had recommended another medication. Please see last note. Patient is refusing to take new med and wants to stick with ASA and plavix. Please advise on refill

## 2014-08-24 NOTE — Telephone Encounter (Signed)
Ok to stick with plavix but let specialist know.

## 2014-08-25 ENCOUNTER — Telehealth: Payer: Self-pay | Admitting: Nurse Practitioner

## 2014-08-25 MED ORDER — NEBIVOLOL HCL 10 MG PO TABS: 10.0000 mg | ORAL_TABLET | Freq: Every day | ORAL | Status: DC

## 2014-08-25 NOTE — Telephone Encounter (Signed)
Samples up front. Patient aware. 

## 2014-08-26 NOTE — Telephone Encounter (Signed)
Per cardiologist note he is aware that she did not want to change from plavix to xarelto.

## 2014-09-18 ENCOUNTER — Other Ambulatory Visit: Payer: Self-pay | Admitting: Nurse Practitioner

## 2014-09-22 ENCOUNTER — Telehealth: Payer: Self-pay | Admitting: Nurse Practitioner

## 2014-09-23 NOTE — Telephone Encounter (Signed)
Up front patient aware to pick up

## 2014-10-02 ENCOUNTER — Other Ambulatory Visit: Payer: Self-pay | Admitting: Nurse Practitioner

## 2014-10-08 ENCOUNTER — Other Ambulatory Visit: Payer: Self-pay | Admitting: Nurse Practitioner

## 2014-10-08 NOTE — Telephone Encounter (Signed)
Notified none available.

## 2014-10-13 NOTE — Telephone Encounter (Signed)
Patient aware samples ready

## 2014-10-22 ENCOUNTER — Ambulatory Visit (INDEPENDENT_AMBULATORY_CARE_PROVIDER_SITE_OTHER): Payer: Medicare Other | Admitting: Nurse Practitioner

## 2014-10-22 ENCOUNTER — Encounter: Payer: Self-pay | Admitting: Nurse Practitioner

## 2014-10-22 VITALS — BP 130/76 | HR 53 | Temp 97.2°F | Ht 61.0 in | Wt 120.4 lb

## 2014-10-22 DIAGNOSIS — I10 Essential (primary) hypertension: Secondary | ICD-10-CM | POA: Insufficient documentation

## 2014-10-22 DIAGNOSIS — N181 Chronic kidney disease, stage 1: Secondary | ICD-10-CM

## 2014-10-22 DIAGNOSIS — E1165 Type 2 diabetes mellitus with hyperglycemia: Secondary | ICD-10-CM

## 2014-10-22 DIAGNOSIS — E559 Vitamin D deficiency, unspecified: Secondary | ICD-10-CM

## 2014-10-22 DIAGNOSIS — Z794 Long term (current) use of insulin: Secondary | ICD-10-CM

## 2014-10-22 DIAGNOSIS — E118 Type 2 diabetes mellitus with unspecified complications: Secondary | ICD-10-CM | POA: Insufficient documentation

## 2014-10-22 DIAGNOSIS — E1065 Type 1 diabetes mellitus with hyperglycemia: Secondary | ICD-10-CM

## 2014-10-22 DIAGNOSIS — IMO0001 Reserved for inherently not codable concepts without codable children: Secondary | ICD-10-CM

## 2014-10-22 DIAGNOSIS — I2510 Atherosclerotic heart disease of native coronary artery without angina pectoris: Secondary | ICD-10-CM

## 2014-10-22 DIAGNOSIS — R609 Edema, unspecified: Secondary | ICD-10-CM

## 2014-10-22 DIAGNOSIS — E785 Hyperlipidemia, unspecified: Secondary | ICD-10-CM

## 2014-10-22 LAB — POCT GLYCOSYLATED HEMOGLOBIN (HGB A1C): Hemoglobin A1C: 6.2

## 2014-10-22 MED ORDER — FENOFIBRATE MICRONIZED 134 MG PO CAPS: 134.0000 mg | ORAL_CAPSULE | Freq: Every day | ORAL | Status: DC

## 2014-10-22 MED ORDER — LINAGLIPTIN 5 MG PO TABS: ORAL_TABLET | ORAL | Status: DC

## 2014-10-22 NOTE — Patient Instructions (Addendum)

## 2014-10-22 NOTE — Progress Notes (Signed)
Subjective:    Patient ID: Robin [name], female    DOB: 05-04-1928, 80 y.o.   MRN: [id number]  Patient here today for follow up of chronic medical problems. No acute complaint.   Hypertension This is a chronic problem. The current episode started more than 1 year ago. The problem is controlled. Pertinent negatives include no chest pain or palpitations. Risk factors for coronary artery disease include diabetes mellitus, dyslipidemia and post-menopausal state. There are no compliance problems.   Hyperlipidemia This is a chronic problem. The current episode started more than 1 year ago. Pertinent negatives include no chest pain. There are no compliance problems.  Risk factors for coronary artery disease include hypertension, dyslipidemia and diabetes mellitus.  Diabetes She presents for her follow-up diabetic visit. She has type 2 diabetes mellitus. Associated symptoms include fatigue and weakness. Pertinent negatives for diabetes include no chest pain and no visual change. Risk factors for coronary artery disease include hypertension, dyslipidemia, diabetes mellitus and post-menopausal. She sees a podiatrist.Eye exam is current.  Hypokalemia Potassium supplements daily- No c/o lower ext cramps CAD No c/o chest pain or cramping of lower ext. Has not needed to take nitorglycerin CHF No recent swelling or SOB- has not needed to take zaroxolyn in awhile Atrial fib under control- does not c/o palpitations or heart racing.    Review of Systems  Constitutional: Positive for fatigue.  Cardiovascular: Negative for chest pain and palpitations.  Neurological: Positive for weakness.  All other systems reviewed and are negative. BP 166/54 mmHg  Pulse 53  Temp(Src) 97.2 F (36.2 C) (Oral)  Ht '5\' 1"'  (1.549 m)  Wt 120 lb 6.4 oz (54.613 kg)  BMI 22.76 kg/m2      Objective:   Physical Exam  Constitutional: She is oriented to person, place, and time. She appears well-developed and well-nourished.   HENT:  Head: Normocephalic.  Eyes: Conjunctivae and EOM are normal. Pupils are equal, round, and reactive to light.  Cardiovascular: Normal heart sounds and intact distal pulses.  Bradycardia present.   Pulmonary/Chest: Effort normal.  Abdominal: Soft.  Musculoskeletal: Normal range of motion.  Neurological: She is alert and oriented to person, place, and time. She has normal reflexes.  Skin: Skin is warm.  Multiple mold, varying size all over her body.   Psychiatric: She has a normal mood and affect. Her behavior is normal. Judgment and thought content normal.   BP 166/54 mmHg  Pulse 53  Temp(Src) 97.2 F (36.2 C) (Oral)  Ht '5\' 1"'  (1.549 m)  Wt 120 lb 6.4 oz (54.613 kg)  BMI 22.76 kg/m2  Results for orders placed or performed in visit on 10/22/14  POCT glycosylated hemoglobin (Hb A1C)  Result Value Ref Range   Hemoglobin A1C 6.2%        Assessment & Plan:   1. Hyperlipemia Low fat diet - NMR, lipoprofile - fenofibrate micronized (LOFIBRA) 134 MG capsule; Take 1 capsule (134 mg total) by mouth daily before breakfast.  Dispense: 30 capsule; Refill: 5  2. Essential hypertension Low salt in diet - CMP14+EGFR  3. Diabetes mellitus, insulin dependent (IDDM), uncontrolled count carbs - POCT glycosylated hemoglobin (Hb A1C) - linagliptin (TRADJENTA) 5 MG TABS tablet; TAKE ONE (1) TABLET EACH DAY  Dispense: 30 tablet; Refill: 5  4. Coronary artery disease involving native coronary artery of native heart without angina pectoris  5. Peripheral edema Elevate legs when sitting   6. Vitamin D deficiency   CHEst x ray and EKG at next visit Refuses  immunizations Labs pending Health maintenance reviewed Diet and exercise encouraged Continue all meds Follow up  In 3  months  [place], FNP

## 2014-10-23 LAB — CMP14+EGFR
ALK PHOS: 43 IU/L (ref 39–117)
ALT: 19 IU/L (ref 0–32)
AST: 22 IU/L (ref 0–40)
Albumin/Globulin Ratio: 1.4 (ref 1.1–2.5)
Albumin: 3.9 g/dL (ref 3.5–4.7)
BILIRUBIN TOTAL: 0.5 mg/dL (ref 0.0–1.2)
BUN/Creatinine Ratio: 25 (ref 11–26)
BUN: 52 mg/dL — AB (ref 8–27)
CHLORIDE: 105 mmol/L (ref 97–108)
CO2: 24 mmol/L (ref 18–29)
Calcium: 9.8 mg/dL (ref 8.7–10.3)
Creatinine, Ser: 2.04 mg/dL — ABNORMAL HIGH (ref 0.57–1.00)
GFR calc Af Amer: 25 mL/min/{1.73_m2} — ABNORMAL LOW (ref >59)
GFR calc non Af Amer: 22 mL/min/{1.73_m2} — ABNORMAL LOW (ref >59)
GLOBULIN, TOTAL: 2.8 g/dL (ref 1.5–4.5)
GLUCOSE: 128 mg/dL — AB (ref 65–99)
Potassium: 4.6 mmol/L (ref 3.5–5.2)
Sodium: 145 mmol/L — ABNORMAL HIGH (ref 134–144)
TOTAL PROTEIN: 6.7 g/dL (ref 6.0–8.5)

## 2014-10-23 LAB — NMR, LIPOPROFILE
CHOLESTEROL: 149 mg/dL (ref 100–199)
HDL Cholesterol by NMR: 30 mg/dL — ABNORMAL LOW (ref >39)
HDL PARTICLE NUMBER: 33 umol/L (ref >=30.5)
LDL PARTICLE NUMBER: 1027 nmol/L — AB (ref <1000)
LDL Size: 20.7 nm (ref >20.5)
LDL-C: 76 mg/dL (ref 0–99)
LP-IR Score: 76 — ABNORMAL HIGH (ref <=45)
Small LDL Particle Number: 492 nmol/L (ref <=527)
TRIGLYCERIDES BY NMR: 214 mg/dL — AB (ref 0–149)

## 2014-10-24 ENCOUNTER — Telehealth: Payer: Self-pay

## 2014-10-24 NOTE — Telephone Encounter (Signed)
Pt's daughter aware of lab results as per DPR of 04/2013

## 2014-10-24 NOTE — Telephone Encounter (Signed)
-----   Message from [facility], [place] sent at 10/23/2014  8:01 AM EST ----- Hgba1c discussed at appointment Kidney and liver function stable Creatine keeps fluctuating- avoid all NSAIDS Cholesterol looks ok- trig are up some but do not want yo add any meds at this time Continue current meds- low fat diet and exercise and recheck in 3 months

## 2014-11-21 ENCOUNTER — Other Ambulatory Visit: Payer: Self-pay | Admitting: Nurse Practitioner

## 2014-12-01 ENCOUNTER — Telehealth: Payer: Self-pay | Admitting: Nurse Practitioner

## 2014-12-01 NOTE — Telephone Encounter (Signed)
Aware, bystolic samples ready.

## 2014-12-03 ENCOUNTER — Other Ambulatory Visit: Payer: Self-pay | Admitting: Nurse Practitioner

## 2014-12-24 ENCOUNTER — Other Ambulatory Visit: Payer: Self-pay | Admitting: Nurse Practitioner

## 2014-12-30 ENCOUNTER — Telehealth: Payer: Self-pay | Admitting: Nurse Practitioner

## 2014-12-30 MED ORDER — NEBIVOLOL HCL 10 MG PO TABS: 10.0000 mg | ORAL_TABLET | Freq: Every day | ORAL | Status: DC

## 2014-12-30 NOTE — Telephone Encounter (Signed)
Pt aware - samples up front  

## 2015-01-22 ENCOUNTER — Other Ambulatory Visit: Payer: Self-pay | Admitting: Nurse Practitioner

## 2015-01-26 ENCOUNTER — Ambulatory Visit (INDEPENDENT_AMBULATORY_CARE_PROVIDER_SITE_OTHER): Payer: Medicare Other | Admitting: Nurse Practitioner

## 2015-01-26 ENCOUNTER — Encounter: Payer: Self-pay | Admitting: Nurse Practitioner

## 2015-01-26 VITALS — BP 140/55 | HR 57 | Temp 97.1°F | Ht 61.0 in | Wt 122.0 lb

## 2015-01-26 DIAGNOSIS — E876 Hypokalemia: Secondary | ICD-10-CM

## 2015-01-26 DIAGNOSIS — I480 Paroxysmal atrial fibrillation: Secondary | ICD-10-CM

## 2015-01-26 DIAGNOSIS — E1165 Type 2 diabetes mellitus with hyperglycemia: Secondary | ICD-10-CM

## 2015-01-26 DIAGNOSIS — Z794 Long term (current) use of insulin: Secondary | ICD-10-CM

## 2015-01-26 DIAGNOSIS — E1065 Type 1 diabetes mellitus with hyperglycemia: Secondary | ICD-10-CM

## 2015-01-26 DIAGNOSIS — I10 Essential (primary) hypertension: Secondary | ICD-10-CM

## 2015-01-26 DIAGNOSIS — I38 Endocarditis, valve unspecified: Secondary | ICD-10-CM

## 2015-01-26 DIAGNOSIS — IMO0001 Reserved for inherently not codable concepts without codable children: Secondary | ICD-10-CM

## 2015-01-26 DIAGNOSIS — E785 Hyperlipidemia, unspecified: Secondary | ICD-10-CM

## 2015-01-26 DIAGNOSIS — I2510 Atherosclerotic heart disease of native coronary artery without angina pectoris: Secondary | ICD-10-CM

## 2015-01-26 DIAGNOSIS — R609 Edema, unspecified: Secondary | ICD-10-CM

## 2015-01-26 LAB — POCT GLYCOSYLATED HEMOGLOBIN (HGB A1C): Hemoglobin A1C: 6.3

## 2015-01-26 MED ORDER — GLIMEPIRIDE 4 MG PO TABS
ORAL_TABLET | ORAL | Status: DC
Start: 2015-01-26 — End: 2015-05-06

## 2015-01-26 MED ORDER — CLOPIDOGREL BISULFATE 75 MG PO TABS: ORAL_TABLET | ORAL | Status: DC

## 2015-01-26 MED ORDER — POTASSIUM CHLORIDE ER 10 MEQ PO TBCR: 10.0000 meq | EXTENDED_RELEASE_TABLET | Freq: Two times a day (BID) | ORAL | Status: DC

## 2015-01-26 NOTE — Progress Notes (Signed)
Subjective:    Patient ID: Robin [name], female    DOB: 05-21-1928, 81 y.o.   MRN: [id number]  Patient here today for follow up of chronic medical problems. No acute complaint.   Hypertension This is a chronic problem. The current episode started more than 1 year ago. The problem is controlled. Pertinent negatives include no chest pain or palpitations. Risk factors for coronary artery disease include diabetes mellitus, dyslipidemia and post-menopausal state. There are no compliance problems.   Hyperlipidemia This is a chronic problem. The current episode started more than 1 year ago. The problem is uncontrolled. Recent lipid tests were reviewed and are variable. Pertinent negatives include no chest pain. Current antihyperlipidemic treatment includes statins. There are no compliance problems.  Risk factors for coronary artery disease include hypertension, dyslipidemia and diabetes mellitus.  Diabetes She presents for her follow-up diabetic visit. She has type 2 diabetes mellitus. There are no hypoglycemic associated symptoms. Associated symptoms include fatigue and weakness. Pertinent negatives for diabetes include no chest pain and no visual change. There are no hypoglycemic complications. There are no diabetic complications. Risk factors for coronary artery disease include hypertension, dyslipidemia, diabetes mellitus and post-menopausal. Her weight is stable. She is following a diabetic diet. When asked about meal planning, she reported none. She has not had a previous visit with a dietitian. She rarely participates in exercise. Her breakfast blood glucose is taken between 9-10 am. Her breakfast blood glucose range is generally 130-140 mg/dl. Her overall blood glucose range is 130-140 mg/dl. An ACE inhibitor/angiotensin II receptor blocker is being taken. She sees a podiatrist.Eye exam is current.  Hypokalemia Potassium supplements daily- No c/o lower ext cramps CAD No c/o chest pain or cramping of  lower ext. Has not needed to take nitorglycerin CHF No recent swelling or SOB- has not needed to take zaroxolyn in awhile Atrial fib under control- does not c/o palpitations or heart racing.    Review of Systems  Constitutional: Positive for fatigue.  Cardiovascular: Negative for chest pain and palpitations.  Neurological: Positive for weakness.  All other systems reviewed and are negative. BP 140/55 mmHg  Pulse 57  Temp(Src) 97.1 F (36.2 C) (Oral)  Ht 5' 1" (1.549 m)  Wt 122 lb (55.339 kg)  BMI 23.06 kg/m2      Objective:   Physical Exam  Constitutional: She is oriented to person, place, and time. She appears well-developed and well-nourished.  HENT:  Head: Normocephalic.  Eyes: Conjunctivae and EOM are normal. Pupils are equal, round, and reactive to light.  Cardiovascular: Normal heart sounds and intact distal pulses.  Bradycardia present.   Pulmonary/Chest: Effort normal.  Abdominal: Soft.  Musculoskeletal: Normal range of motion.  Neurological: She is alert and oriented to person, place, and time. She has normal reflexes.  Skin: Skin is warm.  Multiple mold, varying size all over her body.   Psychiatric: She has a normal mood and affect. Her behavior is normal. Judgment and thought content normal.   BP 140/55 mmHg  Pulse 57  Temp(Src) 97.1 F (36.2 C) (Oral)  Ht 5' 1" (1.549 m)  Wt 122 lb (55.339 kg)  BMI 23.06 kg/m2  Results for orders placed or performed in visit on 01/26/15  POCT glycosylated hemoglobin (Hb A1C)  Result Value Ref Range   Hemoglobin A1C 6.3%        Assessment & Plan:   1. Essential hypertension Do not add salt to diet - CMP14+EGFR  2. Hyperlipemia Low fat diet - NMR, lipoprofile  3. Diabetes mellitus, insulin dependent (IDDM), uncontrolled caontinue to watch carbs - POCT glycosylated hemoglobin (Hb A1C) - glimepiride (AMARYL) 4 MG tablet; TAKE ONE (1) TABLET EACH DAY  Dispense: 30 tablet; Refill: 5  4. Hypokalemia -  potassium chloride (K-DUR) 10 MEQ tablet; Take 1 tablet (10 mEq total) by mouth 2 (two) times daily.  Dispense: 60 tablet; Refill: 5  5. Valvular heart disease  6. Atherosclerosis of native coronary artery of native heart without angina pectoris  7. Paroxysmal atrial fibrillation - clopidogrel (PLAVIX) 75 MG tablet; TAKE ONE (1) TABLET EACH DAY  Dispense: 30 tablet; Refill: 5  8. Peripheral edema Elevate legs when sitting    Labs pending Health maintenance reviewed Diet and exercise encouraged Continue all meds Follow up  In 3 months   [place], FNP

## 2015-01-26 NOTE — Patient Instructions (Signed)

## 2015-01-27 ENCOUNTER — Encounter: Payer: Self-pay | Admitting: Nurse Practitioner

## 2015-01-27 LAB — NMR, LIPOPROFILE
CHOLESTEROL: 149 mg/dL (ref 100–199)
HDL CHOLESTEROL BY NMR: 35 mg/dL — AB (ref >39)
HDL PARTICLE NUMBER: 32.3 umol/L (ref >=30.5)
LDL Particle Number: 1110 nmol/L — ABNORMAL HIGH (ref <1000)
LDL Size: 20.5 nm (ref >20.5)
LDL-C: 79 mg/dL (ref 0–99)
LP-IR Score: 72 — ABNORMAL HIGH (ref <=45)
SMALL LDL PARTICLE NUMBER: 505 nmol/L (ref <=527)
Triglycerides by NMR: 176 mg/dL — ABNORMAL HIGH (ref 0–149)

## 2015-01-27 LAB — CMP14+EGFR
ALBUMIN: 3.7 g/dL (ref 3.5–4.7)
ALT: 14 IU/L (ref 0–32)
AST: 25 IU/L (ref 0–40)
Albumin/Globulin Ratio: 1.4 (ref 1.1–2.5)
Alkaline Phosphatase: 48 IU/L (ref 39–117)
BUN / CREAT RATIO: 21 (ref 11–26)
BUN: 41 mg/dL — AB (ref 8–27)
Bilirubin Total: 0.5 mg/dL (ref 0.0–1.2)
CALCIUM: 9.8 mg/dL (ref 8.7–10.3)
CO2: 23 mmol/L (ref 18–29)
Chloride: 106 mmol/L (ref 97–108)
Creatinine, Ser: 1.91 mg/dL — ABNORMAL HIGH (ref 0.57–1.00)
GFR calc Af Amer: 27 mL/min/{1.73_m2} — ABNORMAL LOW (ref >59)
GFR calc non Af Amer: 24 mL/min/{1.73_m2} — ABNORMAL LOW (ref >59)
GLOBULIN, TOTAL: 2.7 g/dL (ref 1.5–4.5)
GLUCOSE: 83 mg/dL (ref 65–99)
Potassium: 4.4 mmol/L (ref 3.5–5.2)
Sodium: 145 mmol/L — ABNORMAL HIGH (ref 134–144)
Total Protein: 6.4 g/dL (ref 6.0–8.5)

## 2015-02-09 ENCOUNTER — Encounter: Payer: Self-pay | Admitting: Cardiovascular Disease

## 2015-02-09 ENCOUNTER — Ambulatory Visit (INDEPENDENT_AMBULATORY_CARE_PROVIDER_SITE_OTHER): Payer: Medicare Other | Admitting: Cardiovascular Disease

## 2015-02-09 VITALS — BP 138/60 | HR 52 | Ht 64.0 in | Wt 128.0 lb

## 2015-02-09 DIAGNOSIS — Z794 Long term (current) use of insulin: Secondary | ICD-10-CM

## 2015-02-09 DIAGNOSIS — Z5181 Encounter for therapeutic drug level monitoring: Secondary | ICD-10-CM

## 2015-02-09 DIAGNOSIS — I5032 Chronic diastolic (congestive) heart failure: Secondary | ICD-10-CM

## 2015-02-09 DIAGNOSIS — I498 Other specified cardiac arrhythmias: Secondary | ICD-10-CM

## 2015-02-09 DIAGNOSIS — E785 Hyperlipidemia, unspecified: Secondary | ICD-10-CM

## 2015-02-09 DIAGNOSIS — Z7901 Long term (current) use of anticoagulants: Secondary | ICD-10-CM

## 2015-02-09 DIAGNOSIS — E119 Type 2 diabetes mellitus without complications: Secondary | ICD-10-CM

## 2015-02-09 DIAGNOSIS — I38 Endocarditis, valve unspecified: Secondary | ICD-10-CM

## 2015-02-09 DIAGNOSIS — I2510 Atherosclerotic heart disease of native coronary artery without angina pectoris: Secondary | ICD-10-CM

## 2015-02-09 DIAGNOSIS — I4891 Unspecified atrial fibrillation: Secondary | ICD-10-CM

## 2015-02-09 DIAGNOSIS — I10 Essential (primary) hypertension: Secondary | ICD-10-CM

## 2015-02-09 DIAGNOSIS — IMO0001 Reserved for inherently not codable concepts without codable children: Secondary | ICD-10-CM

## 2015-02-09 DIAGNOSIS — I6523 Occlusion and stenosis of bilateral carotid arteries: Secondary | ICD-10-CM

## 2015-02-09 MED ORDER — NEBIVOLOL HCL 5 MG PO TABS: 5.0000 mg | ORAL_TABLET | Freq: Every day | ORAL | Status: DC

## 2015-02-09 NOTE — Progress Notes (Signed)
Patient ID: Robin [name], female   DOB: November 16, 1928, 81 y.o.   MRN: [id number]      SUBJECTIVE: Mrs. Frisinger presents for follow up of chronic diastolic heart failure, CAD (stent in 2011), atrial fibrillation, carotid artery stenosis, and valvular heart disease.  Her most recent echo in March 2013 revealed normal LV systolic function, EF 58-52%, diastolic dysfunction showing restrictive physiology, elevated LVEDP, moderate MR, severe TR, severe pulmonary hypertension (63 mmHg), a small pericardial effusion, and a dilated IVC. Most recent carotid Dopplers demonstrated mild bilateral disease.  I previously prescribed low-dose Eliquis but she preferred to stay on the aspirin and Plavix. She denies chest pain , lightheadedness, dizziness, syncope, and falls. She occasionally has some shortness of breath.   ECG performed in the office today demonstrates what appears to be a junctional rhythm with a heart rate of 46 bpm, old septal infarct, and a diffuse nonspecific ST segment and T-wave abnormality.  Review of Systems: As per "subjective", otherwise negative.  No Known Allergies  Current Outpatient Prescriptions  Medication Sig Dispense Refill  . aspirin EC 81 MG tablet Take 1 tablet (81 mg total) by mouth daily.    Marland Kitchen atorvastatin (LIPITOR) 40 MG tablet TAKE ONE (1) TABLET EACH DAY 30 tablet 2  . cholecalciferol (VITAMIN D) 1000 UNITS tablet Take 1,000 Units by mouth daily.    . clopidogrel (PLAVIX) 75 MG tablet TAKE ONE (1) TABLET EACH DAY 30 tablet 5  . fenofibrate micronized (LOFIBRA) 134 MG capsule     . glimepiride (AMARYL) 4 MG tablet TAKE ONE (1) TABLET EACH DAY 30 tablet 5  . Insulin Glargine (LANTUS SOLOSTAR) 100 UNIT/ML Solostar Pen Inject 30 Units into the skin daily. Sliding scale 5 pen 11  . Insulin Pen Needle (BD PEN NEEDLE NANO U/F) 32G X 4 MM MISC Inject 1 each into the skin daily. 100 each 11  . linagliptin (TRADJENTA) 5 MG TABS tablet TAKE ONE (1) TABLET EACH DAY 30 tablet 5  .  lisinopril (PRINIVIL,ZESTRIL) 20 MG tablet Take 1 tablet (20 mg total) by mouth 2 (two) times daily. 180 tablet 3  . nebivolol (BYSTOLIC) 10 MG tablet Take 1 tablet (10 mg total) by mouth daily. 28 tablet 0  . nitroGLYCERIN (NITROSTAT) 0.4 MG SL tablet Place 1 tablet (0.4 mg total) under the tongue every 5 (five) minutes x 3 doses as needed. 25 tablet 3  . potassium chloride (K-DUR) 10 MEQ tablet Take 1 tablet (10 mEq total) by mouth 2 (two) times daily. 60 tablet 5  . torsemide (DEMADEX) 20 MG tablet TAKE 2 TABLETS EACH MORNING AND 1 TABLETEACH EVENING 270 tablet 1   No current facility-administered medications for this visit.    Past Medical History  Diagnosis Date  . Respiratory distress   . Chronic renal insufficiency   . Hypertension   . Hypokalemia   . DM (diabetes mellitus)   . Myocardial infarction     NSTEMI/DES LAD, 02/2010  . Shortness of breath   . Cancer     skin cancer  . Chronic diastolic heart failure     EF 60-65%, echo, 02/2012  . Valvular heart disease     Moderate MR; severe TR, echo, 02/2012  . Pulmonary hypertension     Severe (RVSP 63 mmHg), echo, 02/2012  . CAD (coronary artery disease)   . Carotid artery disease     77-82% RICA; 42% LICA, 02/5360  . HLD (hyperlipidemia)   . Atrial fibrillation   . Peripheral edema  Past Surgical History  Procedure Laterality Date  . Total abdominal hysterectomy    . Cholecystectomy    . Cardiac catheterization      with stent 2011  . Coronary stent placement  2011  . Cataract extraction w/phaco  08/29/2011    Procedure: CATARACT EXTRACTION PHACO AND INTRAOCULAR LENS PLACEMENT (IOC);  Surgeon: Tonny Branch;  Location: [facility];  Service: Ophthalmology;  Laterality: Right;  CDE: 10.25  . Eye surgery  Sept. 2012    right KPE w/ IOL  . Cataract extraction w/phaco  09/08/2011    Procedure: CATARACT EXTRACTION PHACO AND INTRAOCULAR LENS PLACEMENT (IOC);  Surgeon: Tonny Branch;  Location: [facility];  Service: Ophthalmology;   Laterality: Left;  CDE: 10.77    History   Social History  . Marital Status: Married    Spouse Name: N/A  . Number of Children: N/A  . Years of Education: N/A   Occupational History  . Retired    Social History Main Topics  . Smoking status: Never Smoker   . Smokeless tobacco: Never Used  . Alcohol Use: No  . Drug Use: No  . Sexual Activity: Not on file   Other Topics Concern  . Not on file   Social History Narrative     Filed Vitals:   02/09/15 1342  BP: 138/60  Pulse: 52  Height: 5\' 4"  (1.626 m)  Weight: 128 lb (58.06 kg)  SpO2: 98%    PHYSICAL EXAM General: NAD  Neck: No JVD, no thyromegaly or thyroid nodule.  Lungs: Clear to auscultation bilaterally with normal respiratory effort.  CV: Nondisplaced PMI. Bradycardic, regular S1/split S2, no S3/S4, no murmur. Trace peripheral leg edema.  Abdomen: Soft, nontender,no distention.  Neurologic: Alert and oriented x 3.  Psych: Normal affect.  Extremities: No clubbing or cyanosis.   ECG: Most recent ECG reviewed.      ASSESSMENT AND PLAN:  Chronic diastolic heart failure  Euvolemic and stable. No change to present diuretic regimen.   Paroxysmal Atrial fibrillation  Currently in a junctional rhythm. Will reduce Bystolic to 5 mg daily. Given evidence from Active-W trial, I previously recommended discontinuing ASA with Plavix for stroke prophylaxis and prescribed Eliquis but she declined and prefers ASA and Plavix.  CAD (coronary artery disease)  Quiescent on current medication regimen. As per pt preference, continue ASA and Plavix.   Carotid artery disease  Mild bilateral disease in June 2015. May be repeated in 1.5 years.   Valvular heart disease  Symptomatically stable.   Essential Hypertension  Controlled on current therapy. Monitor given reduction in Bystolic dose.  Hyperlipidemia Well controlled on Lipitor 40 mg. I reviewed most recent results from 01/2015. No  changes.  IDDM Recent HbA1C 6.3% on 01/26/15 indicating good control for age.  Dispo: f/u 6 months.   Kate Sable, M.D., F.A.C.C.

## 2015-02-09 NOTE — Patient Instructions (Signed)
   Decrease Bystolic to 5mg  daily  Continue all other medications.   Your physician wants you to follow up in: 6 months.  You will receive a reminder letter in the mail one-two months in advance.  If you don't receive a letter, please call our office to schedule the follow up appointment

## 2015-02-20 ENCOUNTER — Other Ambulatory Visit: Payer: Self-pay | Admitting: Nurse Practitioner

## 2015-02-23 ENCOUNTER — Ambulatory Visit: Payer: Self-pay | Admitting: *Deleted

## 2015-02-23 DIAGNOSIS — I4891 Unspecified atrial fibrillation: Secondary | ICD-10-CM

## 2015-03-05 ENCOUNTER — Other Ambulatory Visit: Payer: Self-pay | Admitting: Nurse Practitioner

## 2015-03-10 ENCOUNTER — Ambulatory Visit (INDEPENDENT_AMBULATORY_CARE_PROVIDER_SITE_OTHER): Payer: Medicare Other | Admitting: *Deleted

## 2015-03-10 ENCOUNTER — Telehealth: Payer: Self-pay | Admitting: *Deleted

## 2015-03-10 DIAGNOSIS — I4891 Unspecified atrial fibrillation: Secondary | ICD-10-CM

## 2015-03-10 NOTE — Telephone Encounter (Signed)
-----   Message from Herminio Commons, MD sent at 03/10/2015 10:07 AM EDT ----- Remains in junctional rhythm. D/c Bystolic.

## 2015-03-10 NOTE — Telephone Encounter (Signed)
Daughter [name] notified.

## 2015-03-10 NOTE — Telephone Encounter (Signed)
Notes Recorded by Laurine Blazer, LPN on 07/08/6658 at 93:57 AM Left message to return call.

## 2015-03-10 NOTE — Progress Notes (Signed)
Patient in office this morning for repeat EKG.  Will be forwarded to provider for review.

## 2015-04-02 ENCOUNTER — Other Ambulatory Visit: Payer: Self-pay | Admitting: Nurse Practitioner

## 2015-05-06 ENCOUNTER — Encounter: Payer: Self-pay | Admitting: Nurse Practitioner

## 2015-05-06 ENCOUNTER — Ambulatory Visit (INDEPENDENT_AMBULATORY_CARE_PROVIDER_SITE_OTHER): Payer: Medicare Other | Admitting: Nurse Practitioner

## 2015-05-06 VITALS — BP 142/70 | HR 57 | Temp 97.6°F | Ht 64.0 in | Wt 120.0 lb

## 2015-05-06 DIAGNOSIS — E876 Hypokalemia: Secondary | ICD-10-CM

## 2015-05-06 DIAGNOSIS — E1022 Type 1 diabetes mellitus with diabetic chronic kidney disease: Secondary | ICD-10-CM

## 2015-05-06 DIAGNOSIS — N189 Chronic kidney disease, unspecified: Secondary | ICD-10-CM

## 2015-05-06 DIAGNOSIS — R0989 Other specified symptoms and signs involving the circulatory and respiratory systems: Secondary | ICD-10-CM

## 2015-05-06 DIAGNOSIS — I10 Essential (primary) hypertension: Secondary | ICD-10-CM | POA: Diagnosis not present

## 2015-05-06 DIAGNOSIS — N181 Chronic kidney disease, stage 1: Secondary | ICD-10-CM

## 2015-05-06 DIAGNOSIS — I480 Paroxysmal atrial fibrillation: Secondary | ICD-10-CM

## 2015-05-06 DIAGNOSIS — I6523 Occlusion and stenosis of bilateral carotid arteries: Secondary | ICD-10-CM

## 2015-05-06 DIAGNOSIS — R609 Edema, unspecified: Secondary | ICD-10-CM

## 2015-05-06 DIAGNOSIS — I2510 Atherosclerotic heart disease of native coronary artery without angina pectoris: Secondary | ICD-10-CM

## 2015-05-06 DIAGNOSIS — E785 Hyperlipidemia, unspecified: Secondary | ICD-10-CM

## 2015-05-06 LAB — POCT UA - MICROALBUMIN: MICROALBUMIN (UR) POC: 20 mg/L

## 2015-05-06 LAB — POCT GLYCOSYLATED HEMOGLOBIN (HGB A1C): Hemoglobin A1C: 6.7

## 2015-05-06 MED ORDER — GLIMEPIRIDE 4 MG PO TABS: ORAL_TABLET | ORAL | Status: DC

## 2015-05-06 MED ORDER — CLOPIDOGREL BISULFATE 75 MG PO TABS: ORAL_TABLET | ORAL | Status: DC

## 2015-05-06 MED ORDER — POTASSIUM CHLORIDE ER 10 MEQ PO TBCR: 10.0000 meq | EXTENDED_RELEASE_TABLET | Freq: Two times a day (BID) | ORAL | Status: DC

## 2015-05-06 MED ORDER — TORSEMIDE 20 MG PO TABS: ORAL_TABLET | ORAL | Status: DC

## 2015-05-06 MED ORDER — FENOFIBRATE MICRONIZED 134 MG PO CAPS: 134.0000 mg | ORAL_CAPSULE | Freq: Every day | ORAL | Status: DC

## 2015-05-06 NOTE — Patient Instructions (Signed)

## 2015-05-06 NOTE — Progress Notes (Signed)
Subjective:    Patient ID: Robin [name], female    DOB: [birth year], 81 y.o.   MRN: [id number]  Patient here today for follow up of chronic medical problems. No acute complaint.   Hypertension This is a chronic problem. The current episode started more than 1 year ago. The problem is controlled. Pertinent negatives include no chest pain or palpitations. Risk factors for coronary artery disease include diabetes mellitus, dyslipidemia and post-menopausal state. There are no compliance problems.   Hyperlipidemia This is a chronic problem. The current episode started more than 1 year ago. The problem is uncontrolled. Recent lipid tests were reviewed and are variable. Pertinent negatives include no chest pain. Current antihyperlipidemic treatment includes statins. There are no compliance problems.  Risk factors for coronary artery disease include hypertension, dyslipidemia and diabetes mellitus.  Diabetes She presents for her follow-up diabetic visit. She has type 2 diabetes mellitus. There are no hypoglycemic associated symptoms. Associated symptoms include fatigue and weakness. Pertinent negatives for diabetes include no chest pain and no visual change. There are no hypoglycemic complications. There are no diabetic complications. Risk factors for coronary artery disease include hypertension, dyslipidemia, diabetes mellitus and post-menopausal. Her weight is stable. She is following a diabetic diet. When asked about meal planning, she reported none. She has not had a previous visit with a dietitian. She rarely participates in exercise. Her breakfast blood glucose is taken between 9-10 am. Her breakfast blood glucose range is generally 130-140 mg/dl. Her overall blood glucose range is 130-140 mg/dl. An ACE inhibitor/angiotensin II receptor blocker is being taken. She sees a podiatrist.Eye exam is current.  Hypokalemia Potassium supplements daily- No c/o lower ext cramps CAD No c/o chest pain or cramping of  lower ext. Has not needed to take nitorglycerin CHF No recent swelling or SOB- has not needed to take zaroxolyn in awhile Atrial fib under control- does not c/o palpitations or heart racing.    Review of Systems  Constitutional: Positive for fatigue.  Cardiovascular: Negative for chest pain and palpitations.  Neurological: Positive for weakness.  All other systems reviewed and are negative. BP 159/64 mmHg  Pulse 57  Temp(Src) 97.6 F (36.4 C) (Oral)  Ht 5' 4" (1.626 m)  Wt 120 lb (54.432 kg)  BMI 20.59 kg/m2      Objective:   Physical Exam  Constitutional: She is oriented to person, place, and time. She appears well-developed and well-nourished.  HENT:  Head: Normocephalic.  Eyes: Conjunctivae and EOM are normal. Pupils are equal, round, and reactive to light.  Cardiovascular: Normal heart sounds and intact distal pulses.  Bradycardia present.   Pulmonary/Chest: Effort normal.  Abdominal: Soft.  Musculoskeletal: Normal range of motion.  Neurological: She is alert and oriented to person, place, and time. She has normal reflexes.  Skin: Skin is warm.  Multiple mold, varying size all over her body.   Psychiatric: She has a normal mood and affect. Her behavior is normal. Judgment and thought content normal.   Results for orders placed or performed in visit on 05/06/15  POCT glycosylated hemoglobin (Hb A1C)  Result Value Ref Range   Hemoglobin A1C 6.7   POCT UA - Microalbumin  Result Value Ref Range   Microalbumin Ur, POC 20 mg/L    BP 142/70 mmHg  Pulse 57  Temp(Src) 97.6 F (36.4 C) (Oral)  Ht 5' 4" (1.626 m)  Wt 120 lb (54.432 kg)  BMI 20.59 kg/m2        Assessment & Plan:  1.  Essential hypertension Do not add slat to diet - CMP14+EGFR  2. Hyperlipemia Low fat diet - NMR, lipoprofile - fenofibrate micronized (LOFIBRA) 134 MG capsule; Take 1 capsule (134 mg total) by mouth daily before breakfast.  Dispense: 30 capsule; Refill: 5  3. Diabetes mellitus,  insulin dependent (IDDM), controlled Continue to watch carbs - POCT glycosylated hemoglobin (Hb A1C) - POCT UA - Microalbumin - Microalbumin, urine - glimepiride (AMARYL) 4 MG tablet; TAKE ONE (1) TABLET EACH DAY  Dispense: 30 tablet; Refill: 5   5. Chronic renal insufficiency, stage 1  6. Peripheral edema - torsemide (DEMADEX) 20 MG tablet; TAKE 2 TABLETS EACH MORNING AND 1 TABLETEACH EVENING  Dispense: 270 tablet; Refill: 5  7. Hypokalemia - potassium chloride (K-DUR) 10 MEQ tablet; Take 1 tablet (10 mEq total) by mouth 2 (two) times daily.  Dispense: 60 tablet; Refill: 5  8. Bilateral carotid bruits  9. Coronary artery disease involving native coronary artery of native heart without angina pectoris Keep follow up with cardiology  10. Paroxysmal atrial fibrillation - clopidogrel (PLAVIX) 75 MG tablet; TAKE ONE (1) TABLET EACH DAY  Dispense: 30 tablet; Refill: 5    Labs pending Health maintenance reviewed Diet and exercise encouraged Continue all meds Follow up  In 3 month   Mary[place]Margaret , FNP     

## 2015-05-07 LAB — NMR, LIPOPROFILE
Cholesterol: 166 mg/dL (ref 100–199)
HDL CHOLESTEROL BY NMR: 30 mg/dL — AB (ref >39)
HDL PARTICLE NUMBER: 31 umol/L (ref >=30.5)
LDL PARTICLE NUMBER: 1153 nmol/L — AB (ref <1000)
LDL Size: 20.9 nm (ref >20.5)
LDL-C: 94 mg/dL (ref 0–99)
LP-IR SCORE: 74 — AB (ref <=45)
SMALL LDL PARTICLE NUMBER: 537 nmol/L — AB (ref <=527)
TRIGLYCERIDES BY NMR: 209 mg/dL — AB (ref 0–149)

## 2015-05-07 LAB — CMP14+EGFR
A/G RATIO: 1.3 (ref 1.1–2.5)
ALK PHOS: 57 IU/L (ref 39–117)
ALT: 17 IU/L (ref 0–32)
AST: 21 IU/L (ref 0–40)
Albumin: 3.7 g/dL (ref 3.5–4.7)
BILIRUBIN TOTAL: 0.5 mg/dL (ref 0.0–1.2)
BUN/Creatinine Ratio: 25 (ref 11–26)
BUN: 49 mg/dL — ABNORMAL HIGH (ref 8–27)
CO2: 22 mmol/L (ref 18–29)
Calcium: 9.5 mg/dL (ref 8.7–10.3)
Chloride: 106 mmol/L (ref 97–108)
Creatinine, Ser: 1.95 mg/dL — ABNORMAL HIGH (ref 0.57–1.00)
GFR calc Af Amer: 26 mL/min/{1.73_m2} — ABNORMAL LOW (ref >59)
GFR calc non Af Amer: 23 mL/min/{1.73_m2} — ABNORMAL LOW (ref >59)
GLOBULIN, TOTAL: 2.9 g/dL (ref 1.5–4.5)
Glucose: 138 mg/dL — ABNORMAL HIGH (ref 65–99)
POTASSIUM: 4.9 mmol/L (ref 3.5–5.2)
SODIUM: 146 mmol/L — AB (ref 134–144)
Total Protein: 6.6 g/dL (ref 6.0–8.5)

## 2015-05-07 LAB — MICROALBUMIN, URINE: Microalbumin, Urine: 14.5 ug/mL

## 2015-05-18 ENCOUNTER — Ambulatory Visit (INDEPENDENT_AMBULATORY_CARE_PROVIDER_SITE_OTHER): Payer: Medicare Other | Admitting: Family Medicine

## 2015-05-18 ENCOUNTER — Encounter: Payer: Self-pay | Admitting: Family Medicine

## 2015-05-18 VITALS — BP 195/66 | HR 79 | Temp 97.8°F | Ht 64.0 in | Wt 119.8 lb

## 2015-05-18 DIAGNOSIS — I6523 Occlusion and stenosis of bilateral carotid arteries: Secondary | ICD-10-CM

## 2015-05-18 DIAGNOSIS — M10371 Gout due to renal impairment, right ankle and foot: Secondary | ICD-10-CM | POA: Diagnosis not present

## 2015-05-18 DIAGNOSIS — R251 Tremor, unspecified: Secondary | ICD-10-CM

## 2015-05-18 DIAGNOSIS — N184 Chronic kidney disease, stage 4 (severe): Secondary | ICD-10-CM | POA: Diagnosis not present

## 2015-05-18 LAB — POCT CBC
Granulocyte percent: 74.5 %G (ref 37–80)
HCT, POC: 37.2 % — AB (ref 37.7–47.9)
Hemoglobin: 11.6 g/dL — AB (ref 12.2–16.2)
LYMPH, POC: 1.1 (ref 0.6–3.4)
MCH: 28.1 pg (ref 27–31.2)
MCHC: 31.1 g/dL — AB (ref 31.8–35.4)
MCV: 90.4 fL (ref 80–97)
MPV: 8.3 fL (ref 0–99.8)
POC Granulocyte: 4 (ref 2–6.9)
POC LYMPH %: 19.7 % (ref 10–50)
Platelet Count, POC: 218 10*3/uL (ref 142–424)
RBC: 4.11 M/uL (ref 4.04–5.48)
RDW, POC: 14.9 %
WBC: 5.4 10*3/uL (ref 4.6–10.2)

## 2015-05-18 MED ORDER — COLCRYS 0.6 MG PO TABS: 0.3000 mg | ORAL_TABLET | Freq: Every day | ORAL | Status: DC

## 2015-05-18 NOTE — Progress Notes (Signed)
Subjective:  Patient ID: Robin [name], female    DOB: 10/04/1928  Age: 82 y.o. MRN: [id number]  CC: Toe Pain   HPI [name] presents for 3 days of increasing pain and swelling in the right foot. It is so painful that it's difficult to touch it. She can't walk because of it. She's never had anything similar to this before. When Questioned her regarding gout she denies history of gout. She just had some blood work done in the last couple of weeks and wonders if gout was checked. Additionally she has some tremor today she has not had that before. The pain in the right great toe is isolated. No other joints are involved currently. Severity is quite severe. Patient unable to score on a 1-10 scale.   History Bentlee has a past medical history of Respiratory distress; Chronic renal insufficiency; Hypertension; Hypokalemia; DM (diabetes mellitus); Myocardial infarction; Shortness of breath; Cancer; Chronic diastolic heart failure; Valvular heart disease; Pulmonary hypertension; CAD (coronary artery disease); Carotid artery disease; HLD (hyperlipidemia); Atrial fibrillation; and Peripheral edema.   She has past surgical history that includes Total abdominal hysterectomy; Cholecystectomy; Cardiac catheterization; Coronary stent placement (2011); Cataract extraction w/PHACO (08/29/2011); Eye surgery (Sept. 2012); and Cataract extraction w/PHACO (09/08/2011).   Her family history includes CAD in her mother; Cancer in her brother; Heart attack in her father. There is no history of Coronary artery disease, Anesthesia problems, Hypotension, Malignant hyperthermia, or Pseudochol deficiency.She reports that she has never smoked. She has never used smokeless tobacco. She reports that she does not drink alcohol or use illicit drugs.  Outpatient Prescriptions Prior to Visit  Medication Sig Dispense Refill  . aspirin EC 81 MG tablet Take 1 tablet (81 mg total) by mouth daily.    Marland Kitchen atorvastatin (LIPITOR) 40 MG  tablet TAKE ONE (1) TABLET EACH DAY 30 tablet 3  . cholecalciferol (VITAMIN D) 1000 UNITS tablet Take 1,000 Units by mouth daily.    . clopidogrel (PLAVIX) 75 MG tablet TAKE ONE (1) TABLET EACH DAY 30 tablet 5  . fenofibrate micronized (LOFIBRA) 134 MG capsule Take 1 capsule (134 mg total) by mouth daily before breakfast. 30 capsule 5  . glimepiride (AMARYL) 4 MG tablet TAKE ONE (1) TABLET EACH DAY 30 tablet 5  . Insulin Glargine (LANTUS SOLOSTAR) 100 UNIT/ML Solostar Pen Inject 30 Units into the skin daily. Sliding scale 5 pen 11  . lisinopril (PRINIVIL,ZESTRIL) 20 MG tablet Take 1 tablet (20 mg total) by mouth 2 (two) times daily. 180 tablet 3  . nitroGLYCERIN (NITROSTAT) 0.4 MG SL tablet Place 1 tablet (0.4 mg total) under the tongue every 5 (five) minutes x 3 doses as needed. 25 tablet 3  . potassium chloride (K-DUR) 10 MEQ tablet Take 1 tablet (10 mEq total) by mouth 2 (two) times daily. 60 tablet 5  . torsemide (DEMADEX) 20 MG tablet TAKE 2 TABLETS EACH MORNING AND 1 TABLETEACH EVENING 270 tablet 5  . TRADJENTA 5 MG TABS tablet TAKE ONE (1) TABLET EACH DAY 30 tablet 1  . UNIFINE PENTIPS 32G X 4 MM MISC EVERY DAY 100 each 2   No facility-administered medications prior to visit.    ROS Review of Systems  Constitutional: Negative for fever, chills, diaphoresis, appetite change and fatigue.  HENT: Negative for congestion, ear pain, hearing loss, postnasal drip, rhinorrhea, sore throat and trouble swallowing.   Respiratory: Negative for cough, chest tightness and shortness of breath.   Cardiovascular: Negative for chest pain and palpitations.  Gastrointestinal: Negative for abdominal pain.  Musculoskeletal: Positive for joint swelling and arthralgias.  Skin: Negative for rash.  Neurological: Positive for tremors (onset today).    Objective:  BP 195/66 mmHg  Pulse 79  Temp(Src) 97.8 F (36.6 C) (Oral)  Ht _0  (1.626 m)  Wt 119 lb 12.8 oz (54.341 kg)  BMI 20.55 kg/m2  BP  Readings from Last 3 Encounters:  05/18/15 195/66  05/06/15 142/70  02/09/15 138/60    Wt Readings from Last 3 Encounters:  05/18/15 119 lb 12.8 oz (54.341 kg)  05/06/15 120 lb (54.432 kg)  02/09/15 128 lb (58.06 kg)     Physical Exam  Constitutional: She is oriented to person, place, and time. She appears well-developed and well-nourished. No distress.  HENT:  Head: Normocephalic and atraumatic.  Right Ear: External ear normal.  Left Ear: External ear normal.  Nose: Nose normal.  Mouth/Throat: Oropharynx is clear and moist.  Eyes: Conjunctivae and EOM are normal. Pupils are equal, round, and reactive to light.  Neck: Normal range of motion. Neck supple. No thyromegaly present.  Cardiovascular: Normal rate, regular rhythm and normal heart sounds.   No murmur heard. Pulmonary/Chest: Effort normal and breath sounds normal. No respiratory distress. She has no wheezes. She has no rales.  Abdominal: Soft. Bowel sounds are normal. She exhibits no distension. There is no tenderness.  Musculoskeletal: She exhibits tenderness (moderate erythema and edema at right first MTP joint. There is severe tenderness to light touch and to range of motion of the MTP. The left is uninvolved. No other arthralgias detectable.).  Lymphadenopathy:    She has no cervical adenopathy.  Neurological: She is alert and oriented to person, place, and time. She has normal reflexes. She exhibits abnormal muscle tone (there is a fine rest tremor noted in the upper extremities and neck.).  Skin: Skin is warm and dry.  Psychiatric: She has a normal mood and affect. Her behavior is normal. Judgment and thought content normal.    Lab Results  Component Value Date   HGBA1C 6.7 05/06/2015   HGBA1C 6.3% 01/26/2015   HGBA1C 6.2% 10/22/2014    Lab Results  Component Value Date   WBC 7.0 05/01/2013   HGB 12.5 05/01/2013   HCT 37.0* 05/01/2013   PLT 284 08/24/2011   GLUCOSE 138* 05/06/2015   CHOL 166 05/06/2015    TRIG 209* 05/06/2015   HDL 30* 05/06/2015   LDLCALC 90 07/14/2014   ALT 17 05/06/2015   AST 21 05/06/2015   NA 146* 05/06/2015   K 4.9 05/06/2015   CL 106 05/06/2015   CREATININE 1.95* 05/06/2015   BUN 49* 05/06/2015   CO2 22 05/06/2015   HGBA1C 6.7 05/06/2015    No results found.  Assessment & Plan:   Irelynd was seen today for toe pain.  Diagnoses and all orders for this visit:  Acute gout due to renal impairment involving right foot Orders: -     POCT CBC -     BMP8+EGFR -     Uric acid -     TSH  Chronic renal insufficiency, stage 4 (severe) Orders: -     POCT CBC -     BMP8+EGFR -     Uric acid -     TSH  Tremor Orders: -     POCT CBC -     BMP8+EGFR -     Uric acid -     TSH  Other orders -     COLCRYS  0.6 MG tablet; Take 0.5 tablets (0.3 mg total) by mouth daily.   I am having Ms. Reifsteck start on COLCRYS. I am also having her maintain her cholecalciferol, Insulin Glargine, lisinopril, nitroGLYCERIN, aspirin EC, UNIFINE PENTIPS, atorvastatin, TRADJENTA, fenofibrate micronized, torsemide, clopidogrel, potassium chloride, and glimepiride.  Meds ordered this encounter  Medications  . COLCRYS 0.6 MG tablet    Sig: Take 0.5 tablets (0.3 mg total) by mouth daily.    Dispense:  30 tablet    Refill:  2     Follow-up: Return in about 3 months (around 08/18/2015), or if symptoms worsen or fail to improve.  Claretta Fraise, M.D.

## 2015-05-19 LAB — BMP8+EGFR
BUN/Creatinine Ratio: 24 (ref 11–26)
BUN: 51 mg/dL — AB (ref 8–27)
CHLORIDE: 101 mmol/L (ref 97–108)
CO2: 20 mmol/L (ref 18–29)
Calcium: 9.7 mg/dL (ref 8.7–10.3)
Creatinine, Ser: 2.11 mg/dL — ABNORMAL HIGH (ref 0.57–1.00)
GFR calc non Af Amer: 21 mL/min/{1.73_m2} — ABNORMAL LOW (ref >59)
GFR, EST AFRICAN AMERICAN: 24 mL/min/{1.73_m2} — AB (ref >59)
Glucose: 110 mg/dL — ABNORMAL HIGH (ref 65–99)
Potassium: 4.7 mmol/L (ref 3.5–5.2)
SODIUM: 141 mmol/L (ref 134–144)

## 2015-05-19 LAB — TSH: TSH: 1.1 u[IU]/mL (ref 0.450–4.500)

## 2015-05-19 LAB — URIC ACID: URIC ACID: 10.5 mg/dL — AB (ref 2.5–7.1)

## 2015-05-20 ENCOUNTER — Telehealth: Payer: Self-pay | Admitting: Family Medicine

## 2015-05-20 MED ORDER — TRAMADOL HCL 50 MG PO TABS: 50.0000 mg | ORAL_TABLET | Freq: Four times a day (QID) | ORAL | Status: DC | PRN

## 2015-05-20 NOTE — Telephone Encounter (Signed)
Due to weak kidneys, decrease the med to 1/2 tab every other day. Send in tramadol 1 QID PRN pain #30.

## 2015-05-20 NOTE — Telephone Encounter (Signed)
Advised daughter of directions and that script for pain medication will be ready at the front desk to pickup with photo ID

## 2015-05-22 ENCOUNTER — Ambulatory Visit: Payer: Medicare Other | Admitting: Physician Assistant

## 2015-06-04 ENCOUNTER — Other Ambulatory Visit: Payer: Self-pay | Admitting: Nurse Practitioner

## 2015-07-09 ENCOUNTER — Other Ambulatory Visit: Payer: Self-pay | Admitting: Nurse Practitioner

## 2015-07-23 ENCOUNTER — Other Ambulatory Visit: Payer: Self-pay | Admitting: Nurse Practitioner

## 2015-08-14 ENCOUNTER — Encounter: Payer: Self-pay | Admitting: Nurse Practitioner

## 2015-08-14 ENCOUNTER — Ambulatory Visit (INDEPENDENT_AMBULATORY_CARE_PROVIDER_SITE_OTHER): Payer: Medicare Other | Admitting: Nurse Practitioner

## 2015-08-14 ENCOUNTER — Ambulatory Visit (INDEPENDENT_AMBULATORY_CARE_PROVIDER_SITE_OTHER): Payer: Medicare Other

## 2015-08-14 ENCOUNTER — Other Ambulatory Visit: Payer: Self-pay | Admitting: Nurse Practitioner

## 2015-08-14 VITALS — BP 155/62 | HR 71 | Temp 97.8°F | Ht 64.0 in | Wt 109.0 lb

## 2015-08-14 DIAGNOSIS — K5900 Constipation, unspecified: Secondary | ICD-10-CM | POA: Diagnosis not present

## 2015-08-14 DIAGNOSIS — R1032 Left lower quadrant pain: Secondary | ICD-10-CM

## 2015-08-14 DIAGNOSIS — I6523 Occlusion and stenosis of bilateral carotid arteries: Secondary | ICD-10-CM

## 2015-08-14 DIAGNOSIS — K219 Gastro-esophageal reflux disease without esophagitis: Secondary | ICD-10-CM | POA: Diagnosis not present

## 2015-08-14 DIAGNOSIS — Z09 Encounter for follow-up examination after completed treatment for conditions other than malignant neoplasm: Secondary | ICD-10-CM | POA: Diagnosis not present

## 2015-08-14 MED ORDER — ADVOCATE LANCETS 30G MISC: 1.0000 | Freq: Every day | Status: DC

## 2015-08-14 MED ORDER — OMEPRAZOLE 40 MG PO CPDR: 40.0000 mg | DELAYED_RELEASE_CAPSULE | Freq: Every day | ORAL | Status: DC

## 2015-08-14 MED ORDER — GLUCOSE BLOOD VI STRP: ORAL_STRIP | Status: DC

## 2015-08-14 NOTE — Patient Instructions (Signed)

## 2015-08-14 NOTE — Progress Notes (Signed)
   Subjective:    Patient ID: Robin [name], female    DOB: 10-16-1928, 81 y.o.   MRN: [id number]  HPI Patient brought in by daughter for hospital follow up-They took her to hospital last Thursday because she just did not feel well- Was having some chest pain and back pain- Test were run and they ruled out heart and think it may be gastric related. They started her on reglan to take 3x a day for 10 days. SHe is suppose to be on a PPI but not on her list of meds. SHe says she is feeling much better but has no appetite.    Review of Systems  Constitutional: Negative.   HENT: Negative.   Respiratory: Negative.   Gastrointestinal: Positive for nausea. Negative for vomiting, abdominal pain, constipation, blood in stool and abdominal distention.  Genitourinary: Negative.   Musculoskeletal: Negative.   Neurological: Negative.   Psychiatric/Behavioral: Negative.   All other systems reviewed and are negative.      Objective:   Physical Exam  Constitutional: She is oriented to person, place, and time. She appears well-developed and well-nourished.  Cardiovascular: Normal rate, regular rhythm and normal heart sounds.   Pulmonary/Chest: Effort normal and breath sounds normal.  Neurological: She is alert and oriented to person, place, and time.  Skin: Skin is warm.  Psychiatric: She has a normal mood and affect. Her behavior is normal. Judgment and thought content normal.    BP 155/62 mmHg  Pulse 71  Temp(Src) 97.8 F (36.6 C) (Oral)  Ht 5\' 4"  (1.626 m)  Wt 109 lb (49.442 kg)  BMI 18.70 kg/m2  KUB- moderate stool burden- lumbar scoliosis-Preliminary reading by Ronnald Collum, FNP  [facility]      Assessment & Plan:  1. Hospital discharge follow-up Hospital records reviewed  2. Gastroesophageal reflux disease without esophagitis Avoid spicy foods Do not eat 2 hours prior to bedtime - omeprazole (PRILOSEC) 40 MG capsule; Take 1 capsule (40 mg total) by mouth daily.  Dispense: 30 capsule;  Refill: 3  3. Left lower quadrant pain - DG Abd 1 View; Future  4. Constipation, unspecified constipation type miralax OTC daily in orange juice  RTO prn  Mary-Margaret Hassell Done, FNP

## 2015-08-21 ENCOUNTER — Encounter (HOSPITAL_COMMUNITY): Payer: Self-pay | Admitting: *Deleted

## 2015-08-21 ENCOUNTER — Inpatient Hospital Stay (HOSPITAL_COMMUNITY)
Admission: EM | Admit: 2015-08-21 | Discharge: 2015-08-23 | DRG: 644 | Disposition: A | Discharge status: Home / Self Care | Payer: Medicare Other | Attending: Internal Medicine | Admitting: Internal Medicine

## 2015-08-21 ENCOUNTER — Emergency Department (HOSPITAL_COMMUNITY): Payer: Medicare Other

## 2015-08-21 DIAGNOSIS — N184 Chronic kidney disease, stage 4 (severe): Secondary | ICD-10-CM | POA: Diagnosis not present

## 2015-08-21 DIAGNOSIS — I129 Hypertensive chronic kidney disease with stage 1 through stage 4 chronic kidney disease, or unspecified chronic kidney disease: Secondary | ICD-10-CM | POA: Diagnosis present

## 2015-08-21 DIAGNOSIS — Z8249 Family history of ischemic heart disease and other diseases of the circulatory system: Secondary | ICD-10-CM | POA: Diagnosis not present

## 2015-08-21 DIAGNOSIS — D352 Benign neoplasm of pituitary gland: Principal | ICD-10-CM | POA: Diagnosis present

## 2015-08-21 DIAGNOSIS — R51 Headache: Secondary | ICD-10-CM

## 2015-08-21 DIAGNOSIS — E785 Hyperlipidemia, unspecified: Secondary | ICD-10-CM | POA: Diagnosis present

## 2015-08-21 DIAGNOSIS — I272 Other secondary pulmonary hypertension: Secondary | ICD-10-CM | POA: Diagnosis not present

## 2015-08-21 DIAGNOSIS — Z7982 Long term (current) use of aspirin: Secondary | ICD-10-CM

## 2015-08-21 DIAGNOSIS — E118 Type 2 diabetes mellitus with unspecified complications: Secondary | ICD-10-CM

## 2015-08-21 DIAGNOSIS — M109 Gout, unspecified: Secondary | ICD-10-CM | POA: Diagnosis not present

## 2015-08-21 DIAGNOSIS — Z85828 Personal history of other malignant neoplasm of skin: Secondary | ICD-10-CM | POA: Diagnosis not present

## 2015-08-21 DIAGNOSIS — I252 Old myocardial infarction: Secondary | ICD-10-CM

## 2015-08-21 DIAGNOSIS — D631 Anemia in chronic kidney disease: Secondary | ICD-10-CM

## 2015-08-21 DIAGNOSIS — I480 Paroxysmal atrial fibrillation: Secondary | ICD-10-CM | POA: Diagnosis not present

## 2015-08-21 DIAGNOSIS — N189 Chronic kidney disease, unspecified: Secondary | ICD-10-CM | POA: Diagnosis not present

## 2015-08-21 DIAGNOSIS — E1122 Type 2 diabetes mellitus with diabetic chronic kidney disease: Secondary | ICD-10-CM | POA: Diagnosis not present

## 2015-08-21 DIAGNOSIS — Z794 Long term (current) use of insulin: Secondary | ICD-10-CM

## 2015-08-21 DIAGNOSIS — I5032 Chronic diastolic (congestive) heart failure: Secondary | ICD-10-CM | POA: Diagnosis not present

## 2015-08-21 DIAGNOSIS — Z955 Presence of coronary angioplasty implant and graft: Secondary | ICD-10-CM | POA: Diagnosis not present

## 2015-08-21 DIAGNOSIS — Z7902 Long term (current) use of antithrombotics/antiplatelets: Secondary | ICD-10-CM | POA: Diagnosis not present

## 2015-08-21 DIAGNOSIS — Z79899 Other long term (current) drug therapy: Secondary | ICD-10-CM

## 2015-08-21 DIAGNOSIS — Z8673 Personal history of transient ischemic attack (TIA), and cerebral infarction without residual deficits: Secondary | ICD-10-CM | POA: Diagnosis not present

## 2015-08-21 DIAGNOSIS — Z6820 Body mass index (BMI) 20.0-20.9, adult: Secondary | ICD-10-CM

## 2015-08-21 DIAGNOSIS — E221 Hyperprolactinemia: Secondary | ICD-10-CM

## 2015-08-21 DIAGNOSIS — E162 Hypoglycemia, unspecified: Secondary | ICD-10-CM | POA: Diagnosis not present

## 2015-08-21 DIAGNOSIS — I2510 Atherosclerotic heart disease of native coronary artery without angina pectoris: Secondary | ICD-10-CM | POA: Diagnosis present

## 2015-08-21 DIAGNOSIS — Z9049 Acquired absence of other specified parts of digestive tract: Secondary | ICD-10-CM | POA: Diagnosis present

## 2015-08-21 DIAGNOSIS — I6330 Cerebral infarction due to thrombosis of unspecified cerebral artery: Secondary | ICD-10-CM | POA: Insufficient documentation

## 2015-08-21 DIAGNOSIS — N179 Acute kidney failure, unspecified: Secondary | ICD-10-CM | POA: Diagnosis not present

## 2015-08-21 DIAGNOSIS — E237 Disorder of pituitary gland, unspecified: Secondary | ICD-10-CM | POA: Diagnosis present

## 2015-08-21 DIAGNOSIS — Z90710 Acquired absence of both cervix and uterus: Secondary | ICD-10-CM | POA: Diagnosis not present

## 2015-08-21 DIAGNOSIS — E440 Moderate protein-calorie malnutrition: Secondary | ICD-10-CM | POA: Diagnosis not present

## 2015-08-21 DIAGNOSIS — D509 Iron deficiency anemia, unspecified: Secondary | ICD-10-CM

## 2015-08-21 DIAGNOSIS — E11649 Type 2 diabetes mellitus with hypoglycemia without coma: Secondary | ICD-10-CM | POA: Diagnosis present

## 2015-08-21 DIAGNOSIS — H547 Unspecified visual loss: Secondary | ICD-10-CM

## 2015-08-21 DIAGNOSIS — R531 Weakness: Secondary | ICD-10-CM

## 2015-08-21 DIAGNOSIS — R519 Headache, unspecified: Secondary | ICD-10-CM

## 2015-08-21 DIAGNOSIS — E236 Other disorders of pituitary gland: Secondary | ICD-10-CM

## 2015-08-21 DIAGNOSIS — N390 Urinary tract infection, site not specified: Secondary | ICD-10-CM

## 2015-08-21 DIAGNOSIS — I4891 Unspecified atrial fibrillation: Secondary | ICD-10-CM | POA: Diagnosis present

## 2015-08-21 DIAGNOSIS — H538 Other visual disturbances: Secondary | ICD-10-CM

## 2015-08-21 DIAGNOSIS — I38 Endocarditis, valve unspecified: Secondary | ICD-10-CM | POA: Diagnosis present

## 2015-08-21 LAB — BASIC METABOLIC PANEL
ANION GAP: 11 (ref 5–15)
BUN: 66 mg/dL — AB (ref 6–20)
CALCIUM: 8.8 mg/dL — AB (ref 8.9–10.3)
CO2: 21 mmol/L — AB (ref 22–32)
Chloride: 112 mmol/L — ABNORMAL HIGH (ref 101–111)
Creatinine, Ser: 2.88 mg/dL — ABNORMAL HIGH (ref 0.44–1.00)
GFR calc Af Amer: 16 mL/min — ABNORMAL LOW (ref >60)
GFR calc non Af Amer: 14 mL/min — ABNORMAL LOW (ref >60)
GLUCOSE: 40 mg/dL — AB (ref 65–99)
Potassium: 4.1 mmol/L (ref 3.5–5.1)
Sodium: 144 mmol/L (ref 135–145)

## 2015-08-21 LAB — CBG MONITORING, ED
GLUCOSE-CAPILLARY: 116 mg/dL — AB (ref 65–99)
Glucose-Capillary: 33 mg/dL — CL (ref 65–99)
Glucose-Capillary: 55 mg/dL — ABNORMAL LOW (ref 65–99)
Glucose-Capillary: 206 mg/dL — ABNORMAL HIGH (ref 65–99)
Glucose-Capillary: 213 mg/dL — ABNORMAL HIGH (ref 65–99)

## 2015-08-21 LAB — URINALYSIS, ROUTINE W REFLEX MICROSCOPIC
Bilirubin Urine: NEGATIVE
GLUCOSE, UA: NEGATIVE mg/dL
HGB URINE DIPSTICK: NEGATIVE
Ketones, ur: NEGATIVE mg/dL
Nitrite: NEGATIVE
PROTEIN: NEGATIVE mg/dL
SPECIFIC GRAVITY, URINE: 1.01 (ref 1.005–1.030)
Urobilinogen, UA: 0.2 mg/dL (ref 0.0–1.0)
pH: 5.5 (ref 5.0–8.0)

## 2015-08-21 LAB — APTT: aPTT: 26 seconds (ref 24–37)

## 2015-08-21 LAB — HEPATIC FUNCTION PANEL
ALK PHOS: 48 U/L (ref 38–126)
ALT: 18 U/L (ref 14–54)
AST: 24 U/L (ref 15–41)
Albumin: 2.8 g/dL — ABNORMAL LOW (ref 3.5–5.0)
BILIRUBIN INDIRECT: 0.3 mg/dL (ref 0.3–0.9)
BILIRUBIN TOTAL: 0.4 mg/dL (ref 0.3–1.2)
Bilirubin, Direct: 0.1 mg/dL (ref 0.1–0.5)
Total Protein: 6 g/dL — ABNORMAL LOW (ref 6.5–8.1)

## 2015-08-21 LAB — CBC
HCT: 29.3 % — ABNORMAL LOW (ref 36.0–46.0)
HEMOGLOBIN: 9.4 g/dL — AB (ref 12.0–15.0)
MCH: 31.2 pg (ref 26.0–34.0)
MCHC: 32.1 g/dL (ref 30.0–36.0)
MCV: 97.3 fL (ref 78.0–100.0)
Platelets: 329 10*3/uL (ref 150–400)
RBC: 3.01 MIL/uL — ABNORMAL LOW (ref 3.87–5.11)
RDW: 14.5 % (ref 11.5–15.5)
WBC: 7.3 10*3/uL (ref 4.0–10.5)

## 2015-08-21 LAB — TSH: TSH: 0.379 u[IU]/mL (ref 0.350–4.500)

## 2015-08-21 LAB — GLUCOSE, CAPILLARY
Glucose-Capillary: 177 mg/dL — ABNORMAL HIGH (ref 65–99)
Glucose-Capillary: 179 mg/dL — ABNORMAL HIGH (ref 65–99)

## 2015-08-21 LAB — PROTIME-INR
INR: 1.21 (ref 0.00–1.49)
Prothrombin Time: 15.4 seconds — ABNORMAL HIGH (ref 11.6–15.2)

## 2015-08-21 LAB — URINE MICROSCOPIC-ADD ON

## 2015-08-21 LAB — CORTISOL: Cortisol, Plasma: 19.6 ug/dL

## 2015-08-21 MED ORDER — INSULIN ASPART 100 UNIT/ML ~~LOC~~ SOLN
0.0000 [IU] | Freq: Three times a day (TID) | SUBCUTANEOUS | Status: DC
  Administered 2015-08-21 – 2015-08-22 (×3): 2 [IU] via SUBCUTANEOUS
  Administered 2015-08-23: 1 [IU] via SUBCUTANEOUS
  Administered 2015-08-23: 2 [IU] via SUBCUTANEOUS

## 2015-08-21 MED ORDER — ATORVASTATIN CALCIUM 40 MG PO TABS
40.0000 mg | ORAL_TABLET | Freq: Every day | ORAL | Status: DC
  Administered 2015-08-21 – 2015-08-22 (×2): 40 mg via ORAL
  Filled 2015-08-21 (×2): qty 1

## 2015-08-21 MED ORDER — MECLIZINE HCL 12.5 MG PO TABS
25.0000 mg | ORAL_TABLET | Freq: Once | ORAL | Status: AC
  Administered 2015-08-21: 25 mg via ORAL
  Filled 2015-08-21: qty 2

## 2015-08-21 MED ORDER — PANTOPRAZOLE SODIUM 40 MG PO TBEC
40.0000 mg | DELAYED_RELEASE_TABLET | Freq: Every day | ORAL | Status: DC
  Administered 2015-08-21 – 2015-08-23 (×3): 40 mg via ORAL
  Filled 2015-08-21 (×3): qty 1

## 2015-08-21 MED ORDER — SODIUM CHLORIDE 0.9 % IV SOLN
INTRAVENOUS | Status: DC
  Administered 2015-08-21 – 2015-08-23 (×2): via INTRAVENOUS

## 2015-08-21 MED ORDER — ASPIRIN EC 81 MG PO TBEC
81.0000 mg | DELAYED_RELEASE_TABLET | Freq: Every day | ORAL | Status: DC
  Administered 2015-08-21 – 2015-08-23 (×3): 81 mg via ORAL
  Filled 2015-08-21 (×3): qty 1

## 2015-08-21 MED ORDER — ONDANSETRON 4 MG PO TBDP
4.0000 mg | ORAL_TABLET | Freq: Once | ORAL | Status: AC
  Administered 2015-08-21: 4 mg via ORAL
  Filled 2015-08-21: qty 1

## 2015-08-21 MED ORDER — TORSEMIDE 20 MG PO TABS
40.0000 mg | ORAL_TABLET | Freq: Every day | ORAL | Status: DC
  Administered 2015-08-21 – 2015-08-22 (×2): 40 mg via ORAL
  Filled 2015-08-21 (×2): qty 2

## 2015-08-21 MED ORDER — LORAZEPAM 0.5 MG PO TABS
0.5000 mg | ORAL_TABLET | Freq: Once | ORAL | Status: AC
  Administered 2015-08-21: 0.5 mg via ORAL
  Filled 2015-08-21: qty 1

## 2015-08-21 MED ORDER — ACETAMINOPHEN 650 MG RE SUPP: 650.0000 mg | Freq: Four times a day (QID) | RECTAL | Status: DC | PRN

## 2015-08-21 MED ORDER — DEXTROSE 5 % IV SOLN
1.0000 g | Freq: Once | INTRAVENOUS | Status: AC
  Administered 2015-08-21: 1 g via INTRAVENOUS
  Filled 2015-08-21: qty 10

## 2015-08-21 MED ORDER — DEXTROSE 50 % IV SOLN
INTRAVENOUS | Status: AC
  Filled 2015-08-21: qty 50

## 2015-08-21 MED ORDER — POTASSIUM CHLORIDE ER 10 MEQ PO TBCR
10.0000 meq | EXTENDED_RELEASE_TABLET | Freq: Two times a day (BID) | ORAL | Status: DC
  Administered 2015-08-21 – 2015-08-22 (×2): 10 meq via ORAL
  Filled 2015-08-21 (×4): qty 1

## 2015-08-21 MED ORDER — POLYETHYLENE GLYCOL 3350 17 G PO PACK: 17.0000 g | PACK | Freq: Every day | ORAL | Status: DC | PRN

## 2015-08-21 MED ORDER — ACETAMINOPHEN 325 MG PO TABS: 650.0000 mg | ORAL_TABLET | Freq: Four times a day (QID) | ORAL | Status: DC | PRN

## 2015-08-21 MED ORDER — COLCHICINE 0.6 MG PO TABS
0.3000 mg | ORAL_TABLET | ORAL | Status: DC
  Administered 2015-08-21 – 2015-08-23 (×2): 0.3 mg via ORAL
  Filled 2015-08-21 (×2): qty 1

## 2015-08-21 NOTE — ED Notes (Signed)
Nevin Bloodgood with [facility] received report at 1429, here for pt transport few minutes

## 2015-08-21 NOTE — ED Notes (Signed)
Pt assisted to bedside commode, tolerated well,  

## 2015-08-21 NOTE — ED Notes (Signed)
Dr Tomi Bamberger advised for pt to eat, pt given sprite and peanut butter crackers,

## 2015-08-21 NOTE — ED Notes (Signed)
Per pt family,Dr. Sarajane Jews consulting [place] regarding pt disposition.

## 2015-08-21 NOTE — ED Notes (Addendum)
Per hospitalist, pt can have food/drink.diet coke provided. Meal tray ordered by ED secretary.

## 2015-08-21 NOTE — ED Notes (Signed)
CRITICAL VALUE ALERT  Critical value received:  Glucose 40  Date of notification:  08/21/2015  Time of notification:  03:06  Critical value read back: yes  Nurse who received alert:  Atilano Median RN   MD notified (1st page):  Dr Tomi Bamberger  Time of first page:  03:07  MD notified (2nd page):  Time of second page:  Responding MD:  Dr Tomi Bamberger  Time MD responded:  03:07

## 2015-08-21 NOTE — ED Notes (Signed)
Pt c/o generalized weakness, nausea, vomiting at times, dizziness that has continued since being in [facility] hospital a few weeks ago, was seen by pcp on Friday, reports that she is not feeling any better,

## 2015-08-21 NOTE — Progress Notes (Addendum)
Pt admitted to [facility] via [facility].  Pt alert and oriented.  Reports nausea last night in [facility] ED but denies today.  Daughter at bedside. Discussed with pt the need to call for help before attempting to get OOB; understands.  Bed alarm set, call bell within reach.  Will notifiy MD of pt arrival.  Pt lives with daughter.  She has no belongings with her including glasses, dentures, or hearing aids.

## 2015-08-21 NOTE — ED Notes (Addendum)
Called unit to give report, stated that room not assigned and will have nurse to call back for report, number provided

## 2015-08-21 NOTE — ED Notes (Addendum)
Family reporting pt having some nausea, vomiting, generalized weakness for a little over 3 weeks.  Pt was hospitalized at [facility] about 2 weeks ago for same.  Family reports that pt did see PCP for followup last Friday.

## 2015-08-21 NOTE — ED Notes (Addendum)
Pt still in MRI.rounded on patient family. Pt family denies any needs at this time.

## 2015-08-21 NOTE — ED Provider Notes (Signed)
CSN: [id number]     Arrival date & time 08/21/15  0134 History   First MD Initiated Contact with Patient 08/21/15 0355    Chief Complaint  Patient presents with  . Weakness     (Consider location/radiation/quality/duration/timing/severity/associated sxs/prior Treatment) HPI  Patient reports she has been having problems off and on for the past few weeks. She states she feels run down. She feels dizzy, swimming headed, and states she has a mild frontal headache and some blurred vision. She states she has a feeling of things moving or spinning especially when she stands up. She states she feels fine if she lays down. She has had nausea off and on and has vomited a couple times while she was in the hospital for similar symptoms a few weeks ago. She states she was admitted at [facility] and they diagnosed her with some type of stomach problem. She denies abdominal pain. She states she has bowel movements daily and they're usually normal but sometimes are hard. She denies having balls. She was seen by her PCP 1 week ago and had a x-ray of her abdomen which showed a lot of stool. She states she has a mild frontal headache. It started yesterday morning. Her daughter reports that night she seems to get disoriented and sweaty. Her CBGs have been running in the 80s which is lower than her usual and yesterday morning her CBG was 70 4 in the morning. Patient reports that she has had a loss of appetite. She states she eats breakfast well however she does not eat much during the day. Her daughter states she is gotten so weak and off balance she now is using a cane and sometimes a walker when she walks to prevent her from falling. Patient states she has felt off balance. They deny actually falling. Patient is on Amaryl that she takes in the morning and she takes Lantus insulin in the evening.  PCP PA Rockne Coons at [facility] in [place]   Past Medical History  Diagnosis Date  . Respiratory distress    . Chronic renal insufficiency   . Hypertension   . Hypokalemia   . DM (diabetes mellitus)   . Myocardial infarction     NSTEMI/DES LAD, 02/2010  . Shortness of breath   . Cancer     skin cancer  . Chronic diastolic heart failure     EF 60-65%, echo, 02/2012  . Valvular heart disease     Moderate MR; severe TR, echo, 02/2012  . Pulmonary hypertension     Severe (RVSP 63 mmHg), echo, 02/2012  . CAD (coronary artery disease)   . Carotid artery disease     40-98% RICA; 11% LICA, 08/1477  . HLD (hyperlipidemia)   . Atrial fibrillation   . Peripheral edema    Past Surgical History  Procedure Laterality Date  . Total abdominal hysterectomy    . Cholecystectomy    . Cardiac catheterization      with stent 2011  . Coronary stent placement  2011  . Cataract extraction w/phaco  08/29/2011    Procedure: CATARACT EXTRACTION PHACO AND INTRAOCULAR LENS PLACEMENT (IOC);  Surgeon: Tonny Branch;  Location: [facility];  Service: Ophthalmology;  Laterality: Right;  CDE: 10.25  . Eye surgery  Sept. 2012    right KPE w/ IOL  . Cataract extraction w/phaco  09/08/2011    Procedure: CATARACT EXTRACTION PHACO AND INTRAOCULAR LENS PLACEMENT (IOC);  Surgeon: Tonny Branch;  Location: [facility];  Service: Ophthalmology;  Laterality: Left;  CDE: 10.77   Family History  Problem Relation Age of Onset  . Coronary artery disease Neg Hx   . Anesthesia problems Neg Hx   . Hypotension Neg Hx   . Malignant hyperthermia Neg Hx   . Pseudochol deficiency Neg Hx   . CAD Mother   . Heart attack Father   . Cancer Brother     colon   Social History  Substance Use Topics  . Smoking status: Never Smoker   . Smokeless tobacco: Never Used  . Alcohol Use: No   Normally walks without assistance  OB History    No data available     Review of Systems  All other systems reviewed and are negative.     Allergies  Review of patient's allergies indicates no known allergies.  Home Medications   Prior to Admission  medications   Medication Sig Start Date End Date Taking? Authorizing Masashi Snowdon  atorvastatin (LIPITOR) 40 MG tablet TAKE ONE (1) TABLET EACH DAY 07/09/15  Yes Mary-Margaret Hassell Done, FNP  clopidogrel (PLAVIX) 75 MG tablet TAKE ONE (1) TABLET EACH DAY 05/06/15  Yes Mary-Margaret Hassell Done, FNP  COLCRYS 0.6 MG tablet Take 0.5 tablets (0.3 mg total) by mouth daily. 05/18/15  Yes Claretta Fraise, MD  fenofibrate micronized (LOFIBRA) 134 MG capsule Take 1 capsule (134 mg total) by mouth daily before breakfast. 05/06/15  Yes Mary-Margaret Hassell Done, FNP  glimepiride (AMARYL) 4 MG tablet TAKE ONE (1) TABLET EACH DAY Patient taking differently: TAKE half TABLET EACH DAY 05/06/15  Yes Mary-Margaret Hassell Done, FNP  LANTUS SOLOSTAR 100 UNIT/ML Solostar Pen INJECT 30 UNITS INTO THE SKIN DAILY. SLIDING SCALE 07/24/15  Yes Mary-Margaret Hassell Done, FNP  lisinopril (PRINIVIL,ZESTRIL) 20 MG tablet Take 1 tablet (20 mg total) by mouth 2 (two) times daily. 07/18/14  Yes Herminio Commons, MD  omeprazole (PRILOSEC) 40 MG capsule Take 1 capsule (40 mg total) by mouth daily. 08/14/15  Yes Mary-Margaret Hassell Done, FNP  potassium chloride (K-DUR) 10 MEQ tablet Take 1 tablet (10 mEq total) by mouth 2 (two) times daily. 05/06/15  Yes Mary-Margaret Hassell Done, FNP  torsemide (DEMADEX) 20 MG tablet TAKE 2 TABLETS EACH MORNING AND 1 TABLETEACH EVENING 05/06/15  Yes Mary-Margaret Hassell Done, FNP  ADVOCATE LANCETS 30G MISC 1 each by Other route daily. Check blood sugar 1-2 x a day and prn  Dx E11.9 08/14/15   Mary-Margaret Hassell Done, FNP  aspirin EC 81 MG tablet Take 1 tablet (81 mg total) by mouth daily. 07/22/14   Herminio Commons, MD  cholecalciferol (VITAMIN D) 1000 UNITS tablet Take 1,000 Units by mouth daily.    Historical Brigette Hopfer, MD  glucose blood test strip Test 1-2 x a day and prn  E11.9 08/14/15   Mary-Margaret Hassell Done, FNP  metoCLOPramide (REGLAN) 10 MG tablet  08/07/15   Historical Tammie Ellsworth, MD  nitroGLYCERIN (NITROSTAT) 0.4 MG SL tablet Place 1 tablet (0.4 mg  total) under the tongue every 5 (five) minutes x 3 doses as needed. 07/18/14   Herminio Commons, MD  TRADJENTA 5 MG TABS tablet TAKE ONE (1) TABLET EACH DAY 06/04/15   Claretta Fraise, MD  traMADol (ULTRAM) 50 MG tablet Take 1 tablet (50 mg total) by mouth 4 (four) times daily as needed. 05/20/15   Claretta Fraise, MD  UNIFINE PENTIPS 32G X 4 MM MISC EVERY DAY 02/23/15   Mary-Margaret Hassell Done, FNP   BP 171/67 mmHg  Pulse 67  Temp(Src) 97.4 F (36.3 C) (Oral)  Resp 12  Ht 5\' 3"  (1.6  m)  Wt 110 lb (49.896 kg)  BMI 19.49 kg/m2  SpO2 99%  Vital signs normal   Physical Exam  Constitutional: She is oriented to person, place, and time. She appears well-developed and well-nourished.  Non-toxic appearance. She does not appear ill. No distress.  HENT:  Head: Normocephalic and atraumatic.  Right Ear: External ear normal.  Left Ear: External ear normal.  Nose: Nose normal. No mucosal edema or rhinorrhea.  Mouth/Throat: Oropharynx is clear and moist and mucous membranes are normal. No dental abscesses or uvula swelling.  Eyes: Conjunctivae and EOM are normal. Pupils are equal, round, and reactive to light.  Neck: Normal range of motion and full passive range of motion without pain. Neck supple.  Cardiovascular: Normal rate, regular rhythm and normal heart sounds.  Exam reveals no gallop and no friction rub.   No murmur heard. Pulmonary/Chest: Effort normal and breath sounds normal. No respiratory distress. She has no wheezes. She has no rhonchi. She has no rales. She exhibits no tenderness and no crepitus.  Abdominal: Soft. Normal appearance and bowel sounds are normal. She exhibits no distension. There is no tenderness. There is no rebound and no guarding.  Musculoskeletal: Normal range of motion. She exhibits no edema or tenderness.  Moves all extremities well.   Neurological: She is alert and oriented to person, place, and time. She has normal strength. No cranial nerve deficit.  Skin: Skin is  warm, dry and intact. No rash noted. No erythema. No pallor.  Psychiatric: She has a normal mood and affect. Her speech is normal and behavior is normal. Her mood appears not anxious.  Nursing note and vitals reviewed.   ED Course  Procedures (including critical care time)  Medications  meclizine (ANTIVERT) tablet 25 mg (25 mg Oral Given 08/21/15 0420)  ondansetron (ZOFRAN-ODT) disintegrating tablet 4 mg (4 mg Oral Given 08/21/15 0420)  LORazepam (ATIVAN) tablet 0.5 mg (0.5 mg Oral Given 08/21/15 0711)  cefTRIAXone (ROCEPHIN) 1 g in dextrose 5 % 50 mL IVPB (0 g Intravenous Stopped 08/21/15 0834)   I reviewed patient's prior x-rays and she has not had a CT of her head or MRI in the past. I discussed with patient her symptoms sound like either in her ear or possible posterior circulation stroke. I do not feel CT would be helpful at this point. Patient is willing to stay in the ED and get a MRI this morning. By the time I arrange transportation to [facility] to get her MRI she could just have it done here. Her symptoms are not acute and have been going on for a few weeks.  I discussed patient's MRI results. 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 I am going to discuss her if the hospitalist. She is having progressive difficulty walking that is started over the last 2 weeks requiring her now to go from walking unassisted using a cane and a walker.  60:10:93 Visual Acuity Visual Acuity - Bilateral Distance: 20/200 (pt reports has eye glasses at home. pt reports only wears them when reading or looking at things up close.) ; R Distance: 20/200 ; L Distance: 20/200     09:20 Dr Sarajane Jews, will come see patient in the ED and talk to neurosurgery to decide if she needs transfer to [facility] or if she can stay at [facility] Results for orders placed or  performed during the  hospital encounter of 86/57/84  Basic metabolic panel  Result Value Ref Range   Sodium 144 135 - 145 mmol/L   Potassium 4.1 3.5 - 5.1 mmol/L   Chloride 112 (H) 101 - 111 mmol/L   CO2 21 (L) 22 - 32 mmol/L   Glucose, Bld 40 (LL) 65 - 99 mg/dL   BUN 66 (H) 6 - 20 mg/dL   Creatinine, Ser 2.88 (H) 0.44 - 1.00 mg/dL   Calcium 8.8 (L) 8.9 - 10.3 mg/dL   GFR calc non Af Amer 14 (L) >60 mL/min   GFR calc Af Amer 16 (L) >60 mL/min   Anion gap 11 5 - 15  CBC  Result Value Ref Range   WBC 7.3 4.0 - 10.5 K/uL   RBC 3.01 (L) 3.87 - 5.11 MIL/uL   Hemoglobin 9.4 (L) 12.0 - 15.0 g/dL   HCT 29.3 (L) 36.0 - 46.0 %   MCV 97.3 78.0 - 100.0 fL   MCH 31.2 26.0 - 34.0 pg   MCHC 32.1 30.0 - 36.0 g/dL   RDW 14.5 11.5 - 15.5 %   Platelets 329 150 - 400 K/uL  Urinalysis, Routine w reflex microscopic (not at [facility])  Result Value Ref Range   Color, Urine YELLOW YELLOW   APPearance CLEAR CLEAR   Specific Gravity, Urine 1.010 1.005 - 1.030   pH 5.5 5.0 - 8.0   Glucose, UA NEGATIVE NEGATIVE mg/dL   Hgb urine dipstick NEGATIVE NEGATIVE   Bilirubin Urine NEGATIVE NEGATIVE   Ketones, ur NEGATIVE NEGATIVE mg/dL   Protein, ur NEGATIVE NEGATIVE mg/dL   Urobilinogen, UA 0.2 0.0 - 1.0 mg/dL   Nitrite NEGATIVE NEGATIVE   Leukocytes, UA TRACE (A) NEGATIVE  Urine microscopic-add on  Result Value Ref Range   Squamous Epithelial / LPF FEW (A) RARE   WBC, UA 7-10 <3 WBC/hpf  POC CBG, ED  Result Value Ref Range   Glucose-Capillary 33 (LL) 65 - 99 mg/dL   Comment 1 Notify RN   POC CBG, ED  Result Value Ref Range   Glucose-Capillary 55 (L) 65 - 99 mg/dL   Comment 1 Notify RN    Comment 2 Document in Chart   POC CBG, ED  Result Value Ref Range   Glucose-Capillary 116 (H) 65 - 99 mg/dL  CBG monitoring, ED  Result Value Ref Range   Glucose-Capillary 206 (H) 65 - 99 mg/dL   Laboratory interpretation all normal except initial hypoglycemia that improved after patient ate, anemia, possible UTI, renal insufficiency  which is chronic and mildly worse     Imaging Review Mr Brain Wo Contrast  08/21/2015   CLINICAL DATA:  Weakness, stuttering gait, and vertigo for the past 3 weeks.  EXAM: MRI HEAD WITHOUT CONTRAST  TECHNIQUE: Multiplanar, multiecho pulse sequences of the brain and surrounding structures were obtained without intravenous contrast.  COMPARISON:  None.  FINDINGS: The pituitary is expanded, measuring 12.5 mm in cephalo caudad dimension. It is heterogeneous. The optic chiasm is slightly elevated. The cavernous sinus is within normal limits bilaterally.  The diffusion-weighted images demonstrate no evidence for acute or subacute infarction. Extensive periventricular and subcortical T2 changes are present bilaterally. White matter changes extend into the brainstem.  There is no definite flow in the left vertebral artery. There appears to be a fenestration of the vertebrobasilar junction without definite aneurysm. Anterior circulation is patent.  Bilateral lens replacements are noted. The globes and orbits are intact. The paranasal sinuses are clear. There is  some fluid in the left mastoid air cells. No obstructing at nasopharyngeal lesion is present.  The skullbase is within normal limits. Midline structures are otherwise within normal limits.  IMPRESSION: 1. Enlargement of the pituitary. 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 Please correlate with laboratory testing. Pituitary adenoma is also considered. Metastatic disease is considered in a basin of this a age, but less likely. 2. Atrophy and diffuse white matter disease. 3. Slow or occluded flow in the left vertebral artery without evidence for acute or subacute posterior fossa infarct. 4. Small left mastoid effusion. No obstructing nasopharyngeal lesion is present.   Electronically Signed   By: San Morelle M.D.   On: 08/21/2015 08:29   I have personally reviewed and evaluated these images and lab results as part of my  medical decision-making.   EKG Interpretation   Date/Time:  Friday August 21 2015 02:03:39 EDT Ventricular Rate:  62 PR Interval:  200 QRS Duration: 94 QT Interval:  429 QTC Calculation: 436 R Axis:   17 Text Interpretation:  Sinus rhythm Atrial premature complex Anterior  infarct, old Electrode noise Since last tracing 05 Feb 2010 T wave  inversion no longer evident in Anteroseptal leads and lateral leads  Confirmed by KNAPP  MD-I, IVA ([id number]) on 08/21/2015 2:13:41 AM      MDM   Final diagnoses:  Hypoglycemia  Weakness  Loss of vision  Headache, unspecified headache type  Pituitary gland enlarged    Plan admission  Rolland Porter, MD, Barbette Or, MD 08/21/15 [id number]

## 2015-08-21 NOTE — ED Notes (Signed)
Family reports that pt's blood sugar has been running in 80's in am except for this am when it was 74,

## 2015-08-21 NOTE — ED Notes (Signed)
Pt reports that she is feeling better,  

## 2015-08-21 NOTE — ED Notes (Signed)
Hospitalist at bedside 

## 2015-08-21 NOTE — H&P (Signed)
History and Physical  Robin [name] PZW:[id number] DOB: 04/12/1928 DOA: 08/21/2015  Referring physician: Rolland Porter, MD PCP: Chevis Pretty, FNP   Chief Complaint: Weakness  HPI:  80 yof with a hx ofCAD, CHF, DM, and atrial fibrillation presented with weakness, dizziness, HA, decreased appetite, and nausea. 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 Admitted for further management and evaluation.   Patient reports intermittent HAs, nausea, weakness, and dizziness for two weeks. She was seen at [facility] 2 weeks ago for her symptoms and had an unremarkable workup. She was discharged home with Reglan which she took until 08/15/15 with minimal relief. Since then, her HAs have been increasing in severity and frequency. They are located in the front of her head and do not radiate. She is unaware of any exacerbating factors however Tylenol and laying down improve her symptoms. Her generalized weakness has also been worsening in the last two weeks however she denies any falls. At baseline she is able to walk without assistance. Symptoms are requiring her to use a cane and walker to ambulate. Per daughter, the patient woke up the morning of 9/15 complaining of generalized blurry vision. This morning when she woke up, she was diaphoretic, disoriented and off balance, prompting their visit to the ED.   In the emergency department afebrile, VSS, not hypoxic.  Pertinent labs: BUN 66, Creatnine 2.88, baseline approximately 2.11.  LFTs unremarkable. Hgb 9.4, Initial BS 33, most recent 206.  EKG: Independently reviewed. SR, anterior infarct, old. No acute changes.  Imaging: independently reviewed. MRI of the brain . Enlargement of the pituitary. 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 Please correlate  with laboratory testing. Pituitary adenoma is also considered. Metastatic disease is considered in a basin of this a age, but less likely.  Review of Systems:  Positive for weakness, headache,  dizziness, decreased appetite, subjective fever  blurry vision.  Negative for sore throat, rash, new muscle aches, chest pain, SOB, dysuria, bleeding, vomiting / abdominal pain / nausea.  Past Medical History  Diagnosis Date  . Chronic renal insufficiency   . Hypertension   . Hypokalemia   . DM (diabetes mellitus)   . Myocardial infarction     NSTEMI/DES LAD, 02/2010  . Shortness of breath   . Cancer     skin cancer  . Chronic diastolic heart failure     EF 60-65%, echo, 02/2012  . Valvular heart disease     Moderate MR; severe TR, echo, 02/2012  . Pulmonary hypertension     Severe (RVSP 63 mmHg), echo, 02/2012  . CAD (coronary artery disease)   . Carotid artery disease     42-35% RICA; 36% LICA, 12/4429  . HLD (hyperlipidemia)   . Atrial fibrillation   . Peripheral edema     Past Surgical History  Procedure Laterality Date  . Total abdominal hysterectomy    . Cholecystectomy    . Cardiac catheterization      with stent 2011  . Coronary stent placement  2011  . Cataract extraction w/phaco  08/29/2011    Procedure: CATARACT EXTRACTION PHACO AND INTRAOCULAR LENS PLACEMENT (IOC);  Surgeon: Tonny Branch;  Location: [facility];  Service: Ophthalmology;  Laterality: Right;  CDE: 10.25  . Eye surgery  Sept. 2012    right KPE w/ IOL  . Cataract extraction  w/phaco  09/08/2011    Procedure: CATARACT EXTRACTION PHACO AND INTRAOCULAR LENS PLACEMENT (IOC);  Surgeon: Tonny Branch;  Location: [facility];  Service: Ophthalmology;  Laterality: Left;  CDE: 10.77  . Skin cancer excision      multiple    Social History:  reports that she has never smoked. She has never used smokeless tobacco. She reports that she does not drink alcohol or use illicit drugs. lives with their daughter [facility]  No Known  Allergies  Family History  Problem Relation Age of Onset  . Coronary artery disease Neg Hx   . Anesthesia problems Neg Hx   . Hypotension Neg Hx   . Malignant hyperthermia Neg Hx   . Pseudochol deficiency Neg Hx   . CAD Mother   . Heart attack Father   . Cancer Brother     colon     Prior to Admission medications   Medication Sig Start Date End Date Taking? Authorizing Provider  acetaminophen (TYLENOL) 500 MG tablet Take 500 mg by mouth every 6 (six) hours as needed for moderate pain.   Yes Historical Provider, MD  aspirin EC 81 MG tablet Take 1 tablet (81 mg total) by mouth daily. 07/22/14  Yes Herminio Commons, MD  atorvastatin (LIPITOR) 40 MG tablet TAKE ONE (1) TABLET EACH DAY 07/09/15  Yes Mary-Margaret Hassell Done, FNP  cholecalciferol (VITAMIN D) 1000 UNITS tablet Take 1,000 Units by mouth daily.   Yes Historical Provider, MD  clopidogrel (PLAVIX) 75 MG tablet TAKE ONE (1) TABLET EACH DAY 05/06/15  Yes Mary-Margaret Hassell Done, FNP  COLCRYS 0.6 MG tablet Take 0.5 tablets (0.3 mg total) by mouth daily. 05/18/15  Yes Claretta Fraise, MD  fenofibrate micronized (LOFIBRA) 134 MG capsule Take 1 capsule (134 mg total) by mouth daily before breakfast. 05/06/15  Yes Mary-Margaret Hassell Done, FNP  glimepiride (AMARYL) 4 MG tablet TAKE ONE (1) TABLET EACH DAY Patient taking differently: TAKE half TABLET EACH DAY 05/06/15  Yes Mary-Margaret Hassell Done, FNP  LANTUS SOLOSTAR 100 UNIT/ML Solostar Pen INJECT 30 UNITS INTO THE SKIN DAILY. SLIDING SCALE 07/24/15  Yes Mary-Margaret Hassell Done, FNP  lisinopril (PRINIVIL,ZESTRIL) 20 MG tablet Take 1 tablet (20 mg total) by mouth 2 (two) times daily. 07/18/14  Yes Herminio Commons, MD  metoCLOPramide (REGLAN) 10 MG tablet Take 5 mg by mouth daily as needed for nausea or vomiting.  08/07/15  Yes Historical Provider, MD  nitroGLYCERIN (NITROSTAT) 0.4 MG SL tablet Place 1 tablet (0.4 mg total) under the tongue every 5 (five) minutes x 3 doses as needed. 07/18/14  Yes Herminio Commons, MD  omeprazole (PRILOSEC) 40 MG capsule Take 1 capsule (40 mg total) by mouth daily. 08/14/15  Yes Mary-Margaret Hassell Done, FNP  potassium chloride (K-DUR) 10 MEQ tablet Take 1 tablet (10 mEq total) by mouth 2 (two) times daily. 05/06/15  Yes Mary-Margaret Hassell Done, FNP  torsemide (DEMADEX) 20 MG tablet TAKE 2 TABLETS EACH MORNING AND 1 TABLETEACH EVENING Patient taking differently: Take 40 mg by mouth daily. TAKE 2 TABLETS EACH MORNING AND 1 TABLETEACH EVENING 05/06/15  Yes Mary-Margaret Hassell Done, FNP  ADVOCATE LANCETS 30G MISC 1 each by Other route daily. Check blood sugar 1-2 x a day and prn  Dx E11.9 08/14/15   Mary-Margaret Hassell Done, FNP  glucose blood test strip Test 1-2 x a day and prn  E11.9 08/14/15   Mary-Margaret Hassell Done, FNP  TRADJENTA 5 MG TABS tablet TAKE ONE (1) TABLET EACH DAY 06/04/15   Claretta Fraise, MD  traMADol Veatrice Bourbon) 50  MG tablet Take 1 tablet (50 mg total) by mouth 4 (four) times daily as needed. Patient not taking: Reported on 08/21/2015 05/20/15   Claretta Fraise, MD  UNIFINE PENTIPS 32G X 4 MM MISC EVERY DAY 02/23/15   Mary-Margaret Hassell Done, FNP   Physical Exam: Filed Vitals:   08/21/15 0814 08/21/15 0815 08/21/15 0830 08/21/15 0900  BP: 160/44  171/41 178/50  Pulse: 70 71 71 74  Temp: 97.9 F (36.6 C)     TempSrc: Oral     Resp: 16     Height:      Weight:      SpO2: 100% 100% 97% 100%    VSS, afebrile, not hypoxic General:  Appears calm and comfortable Eyes: PERRL, normal lids, irises & conjunctiva ENT: grossly normal hearing, lips & tongue Neck: no LAD, masses or thyromegaly Cardiovascular: RRR, no m/r/g. No LE edema. Respiratory: CTA bilaterally, no w/r/r. Normal respiratory effort. Abdomen: soft, ntnd Skin: no rash or induration seen on limited exam, scattered bruising noted Musculoskeletal: grossly normal tone BUE/BLE, strength grossly normal all extremities Psychiatric: grossly normal mood and affect, speech fluent and appropriate Neurologic: CN 2-12 intact, no  pronator drift, no pass pointing. Oriented to person, place, and time  Wt Readings from Last 3 Encounters:  08/21/15 49.896 kg (110 lb)  08/21/15 49.896 kg (110 lb)  08/14/15 49.442 kg (109 lb)    Labs on Admission:  Basic Metabolic Panel:  Recent Labs Lab 08/21/15 0221  NA 144  K 4.1  CL 112*  CO2 21*  GLUCOSE 40*  BUN 66*  CREATININE 2.88*  CALCIUM 8.8*    CBC:  Recent Labs Lab 08/21/15 0221  WBC 7.3  HGB 9.4*  HCT 29.3*  MCV 97.3  PLT 329    CBG:  Recent Labs Lab 08/21/15 0310 08/21/15 0403 08/21/15 0537 08/21/15 0821  GLUCAP 33* 55* 116* 206*     Radiological Exams on Admission: Mr Brain Wo Contrast  08/21/2015   CLINICAL DATA:  Weakness, stuttering gait, and vertigo for the past 3 weeks.  EXAM: MRI HEAD WITHOUT CONTRAST  TECHNIQUE: Multiplanar, multiecho pulse sequences of the brain and surrounding structures were obtained without intravenous contrast.  COMPARISON:  None.  FINDINGS: The pituitary is expanded, measuring 12.5 mm in cephalo caudad dimension. It is heterogeneous. The optic chiasm is slightly elevated. The cavernous sinus is within normal limits bilaterally.  The diffusion-weighted images demonstrate no evidence for acute or subacute infarction. Extensive periventricular and subcortical T2 changes are present bilaterally. White matter changes extend into the brainstem.  There is no definite flow in the left vertebral artery. There appears to be a fenestration of the vertebrobasilar junction without definite aneurysm. Anterior circulation is patent.  Bilateral lens replacements are noted. The globes and orbits are intact. The paranasal sinuses are clear. There is some fluid in the left mastoid air cells. No obstructing at nasopharyngeal lesion is present.  The skullbase is within normal limits. Midline structures are otherwise within normal limits.  IMPRESSION: 1. Enlargement of the pituitary. 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 Please correlate with laboratory testing. Pituitary adenoma is also considered. Metastatic disease is considered in a basin of this a age, but less likely. 2. Atrophy and diffuse white matter disease. 3. Slow or occluded flow in the left vertebral artery without evidence for acute or subacute posterior fossa infarct. 4. Small left mastoid effusion. No obstructing nasopharyngeal lesion is present.   Electronically Signed   By: Harrell Gave  Mattern M.D.   On: 08/21/2015 08:29      Principal Problem:   Pituitary mass Active Problems:   CORONARY ATHEROSCLEROSIS NATIVE CORONARY ARTERY   Chronic diastolic heart failure   Atrial fibrillation   Valvular heart disease   CKD (chronic kidney disease), stage IV   Diabetes mellitus type 2 with complications   Generalized weakness   Blurred vision   Headache   AKI (acute kidney injury)   Anemia in CKD (chronic kidney disease)   Assessment/Plan 1. Generalized weakness, visual disturbances, and HAs with abnormal pituitary gland seen on MRI, concerning for symptomatic pituitary mass. 2. AKI superimposed on CKD stage IV secondary to poor oral intake 3. Anemia of CKD, suspect at baseline 4. DM type 2 with CKD, acute hypoglycemia, likely secondary to poor oral intake, ongoing insulin use 5. CAD, currently asymptomatic. Continue ASA, Plavix 6. Chronic diastolic heart failure, compensated 7. PAF, off beta-blocker secondary to junctional rhythm in past. On ASA and Plavix. Declined Eliquis per last office visit with cardiology 8. Valvular heart disease, asymptomatic    Appears stable, no hypotension, sodium and potassium unremarkable.  Discussed with Dr. Ellene Route neurosurgeon, he has reviewed films, does not feel that this is pituitary apoplexia. Recommended hormone workup, transfer to [facility], will see in consult to determine whether surgery indicated.  IVF, check BMP and CBC in AM. Hold lisinopril.  Hold long-acting insulin. SSI. Follow  CBG.  Continue ASA. Hold Plavix (stent 2011)  Consult PT  Admit to [facility]  Code Status:   DVT prophylaxis: Plavix Family Communication: Daughter at bedside. Discussed with patient who understands and has no concerns at this time. Disposition Plan/Anticipated LOS: Admit to medical bed pending neurosurgery's recommendations.   Time spent: 55 minutes  Murray Hodgkins, MD  [facility] Pager [phone] 08/21/2015, 9:12 AM   By signing my name below, I, Rosalie Doctor attest that this documentation has been prepared under the direction and in the presence of Murray Hodgkins, MD Electronically signed: Rosalie Doctor  08/21/2015  I have reviewed the above documentation for accuracy and completeness, and I agree with the above. Murray Hodgkins, MD

## 2015-08-22 DIAGNOSIS — Z79899 Other long term (current) drug therapy: Secondary | ICD-10-CM | POA: Diagnosis not present

## 2015-08-22 DIAGNOSIS — E1122 Type 2 diabetes mellitus with diabetic chronic kidney disease: Secondary | ICD-10-CM | POA: Diagnosis present

## 2015-08-22 DIAGNOSIS — E440 Moderate protein-calorie malnutrition: Secondary | ICD-10-CM | POA: Insufficient documentation

## 2015-08-22 DIAGNOSIS — I5032 Chronic diastolic (congestive) heart failure: Secondary | ICD-10-CM

## 2015-08-22 DIAGNOSIS — E221 Hyperprolactinemia: Secondary | ICD-10-CM | POA: Diagnosis present

## 2015-08-22 DIAGNOSIS — Z7982 Long term (current) use of aspirin: Secondary | ICD-10-CM | POA: Diagnosis not present

## 2015-08-22 DIAGNOSIS — E237 Disorder of pituitary gland, unspecified: Secondary | ICD-10-CM

## 2015-08-22 DIAGNOSIS — N189 Chronic kidney disease, unspecified: Secondary | ICD-10-CM

## 2015-08-22 DIAGNOSIS — Z8673 Personal history of transient ischemic attack (TIA), and cerebral infarction without residual deficits: Secondary | ICD-10-CM | POA: Diagnosis not present

## 2015-08-22 DIAGNOSIS — D631 Anemia in chronic kidney disease: Secondary | ICD-10-CM

## 2015-08-22 DIAGNOSIS — Z6820 Body mass index (BMI) 20.0-20.9, adult: Secondary | ICD-10-CM | POA: Diagnosis not present

## 2015-08-22 DIAGNOSIS — Z8249 Family history of ischemic heart disease and other diseases of the circulatory system: Secondary | ICD-10-CM | POA: Diagnosis not present

## 2015-08-22 DIAGNOSIS — I480 Paroxysmal atrial fibrillation: Secondary | ICD-10-CM | POA: Diagnosis present

## 2015-08-22 DIAGNOSIS — I252 Old myocardial infarction: Secondary | ICD-10-CM | POA: Diagnosis not present

## 2015-08-22 DIAGNOSIS — Z90710 Acquired absence of both cervix and uterus: Secondary | ICD-10-CM | POA: Diagnosis not present

## 2015-08-22 DIAGNOSIS — I272 Other secondary pulmonary hypertension: Secondary | ICD-10-CM | POA: Diagnosis present

## 2015-08-22 DIAGNOSIS — Z794 Long term (current) use of insulin: Secondary | ICD-10-CM | POA: Diagnosis not present

## 2015-08-22 DIAGNOSIS — Z9049 Acquired absence of other specified parts of digestive tract: Secondary | ICD-10-CM | POA: Diagnosis present

## 2015-08-22 DIAGNOSIS — Z955 Presence of coronary angioplasty implant and graft: Secondary | ICD-10-CM | POA: Diagnosis not present

## 2015-08-22 DIAGNOSIS — I129 Hypertensive chronic kidney disease with stage 1 through stage 4 chronic kidney disease, or unspecified chronic kidney disease: Secondary | ICD-10-CM | POA: Diagnosis present

## 2015-08-22 DIAGNOSIS — N179 Acute kidney failure, unspecified: Secondary | ICD-10-CM

## 2015-08-22 DIAGNOSIS — E785 Hyperlipidemia, unspecified: Secondary | ICD-10-CM | POA: Diagnosis present

## 2015-08-22 DIAGNOSIS — D509 Iron deficiency anemia, unspecified: Secondary | ICD-10-CM | POA: Diagnosis not present

## 2015-08-22 DIAGNOSIS — I2510 Atherosclerotic heart disease of native coronary artery without angina pectoris: Secondary | ICD-10-CM | POA: Diagnosis present

## 2015-08-22 DIAGNOSIS — N184 Chronic kidney disease, stage 4 (severe): Secondary | ICD-10-CM | POA: Diagnosis present

## 2015-08-22 DIAGNOSIS — Z85828 Personal history of other malignant neoplasm of skin: Secondary | ICD-10-CM | POA: Diagnosis not present

## 2015-08-22 DIAGNOSIS — Z7902 Long term (current) use of antithrombotics/antiplatelets: Secondary | ICD-10-CM | POA: Diagnosis not present

## 2015-08-22 DIAGNOSIS — E11649 Type 2 diabetes mellitus with hypoglycemia without coma: Secondary | ICD-10-CM | POA: Diagnosis present

## 2015-08-22 DIAGNOSIS — E162 Hypoglycemia, unspecified: Secondary | ICD-10-CM | POA: Diagnosis not present

## 2015-08-22 DIAGNOSIS — M109 Gout, unspecified: Secondary | ICD-10-CM | POA: Diagnosis present

## 2015-08-22 DIAGNOSIS — D352 Benign neoplasm of pituitary gland: Secondary | ICD-10-CM | POA: Diagnosis present

## 2015-08-22 LAB — BASIC METABOLIC PANEL
ANION GAP: 8 (ref 5–15)
BUN: 56 mg/dL — ABNORMAL HIGH (ref 6–20)
CHLORIDE: 111 mmol/L (ref 101–111)
CO2: 23 mmol/L (ref 22–32)
Calcium: 8.6 mg/dL — ABNORMAL LOW (ref 8.9–10.3)
Creatinine, Ser: 2.73 mg/dL — ABNORMAL HIGH (ref 0.44–1.00)
GFR calc non Af Amer: 15 mL/min — ABNORMAL LOW (ref >60)
GFR, EST AFRICAN AMERICAN: 17 mL/min — AB (ref >60)
GLUCOSE: 158 mg/dL — AB (ref 65–99)
POTASSIUM: 4.7 mmol/L (ref 3.5–5.1)
Sodium: 142 mmol/L (ref 135–145)

## 2015-08-22 LAB — GLUCOSE, CAPILLARY
GLUCOSE-CAPILLARY: 114 mg/dL — AB (ref 65–99)
GLUCOSE-CAPILLARY: 198 mg/dL — AB (ref 65–99)
GLUCOSE-CAPILLARY: 186 mg/dL — AB (ref 65–99)

## 2015-08-22 LAB — IRON AND TIBC
Iron: 43 ug/dL (ref 28–170)
SATURATION RATIOS: 10 % — AB (ref 10.4–31.8)
TIBC: 452 ug/dL — AB (ref 250–450)
UIBC: 409 ug/dL

## 2015-08-22 LAB — VITAMIN B12: VITAMIN B 12: 253 pg/mL (ref 180–914)

## 2015-08-22 LAB — URINE CULTURE
Culture: NO GROWTH
Special Requests: NORMAL

## 2015-08-22 LAB — LUTEINIZING HORMONE: LH: 21 m[IU]/mL

## 2015-08-22 LAB — PROLACTIN: Prolactin: 77.4 ng/mL — ABNORMAL HIGH (ref 4.8–23.3)

## 2015-08-22 LAB — FERRITIN: Ferritin: 15 ng/mL (ref 11–307)

## 2015-08-22 LAB — GROWTH HORMONE: Growth Hormone: 0.9 ng/mL (ref 0.0–10.0)

## 2015-08-22 LAB — CBC
HEMATOCRIT: 26.9 % — AB (ref 36.0–46.0)
HEMOGLOBIN: 8.1 g/dL — AB (ref 12.0–15.0)
MCH: 29.7 pg (ref 26.0–34.0)
MCHC: 30.1 g/dL (ref 30.0–36.0)
MCV: 98.5 fL (ref 78.0–100.0)
Platelets: 269 10*3/uL (ref 150–400)
RBC: 2.73 MIL/uL — AB (ref 3.87–5.11)
RDW: 14.9 % (ref 11.5–15.5)
WBC: 4.8 10*3/uL (ref 4.0–10.5)

## 2015-08-22 LAB — INSULIN-LIKE GROWTH FACTOR: Somatomedin C: 68 ng/mL (ref 34–172)

## 2015-08-22 LAB — RETICULOCYTES
RBC.: 2.8 MIL/uL — AB (ref 3.87–5.11)
RETIC CT PCT: 2.7 % (ref 0.4–3.1)
Retic Count, Absolute: 75.6 10*3/uL (ref 19.0–186.0)

## 2015-08-22 LAB — ESTRADIOL

## 2015-08-22 LAB — FOLLICLE STIMULATING HORMONE: FSH: 42.6 m[IU]/mL

## 2015-08-22 LAB — FOLATE: FOLATE: 13.7 ng/mL (ref >5.9)

## 2015-08-22 MED ORDER — BOOST / RESOURCE BREEZE PO LIQD: 1.0000 | Freq: Two times a day (BID) | ORAL | Status: DC

## 2015-08-22 MED ORDER — GLUCERNA SHAKE PO LIQD
237.0000 mL | Freq: Two times a day (BID) | ORAL | Status: DC
  Administered 2015-08-22 – 2015-08-23 (×3): 237 mL via ORAL
  Filled 2015-08-22 (×6): qty 237

## 2015-08-22 NOTE — Consult Note (Signed)
Reason for Consult: Pituitary lesion Referring Physician: Dr. Sharlette Dense is an 81 y.o. female.  HPI: Patient is an 81 year old right-handed individual who tells me that she's been feeling weak for about 3 weeks. Time in addition she's felt that she's had some blurriness of her vision and has been unsteady on her feet for that. Time she presented to the emergency department yesterday at [facility] and an MRI of the brain was performed. This study reveals presence of a 12 mm pituitary lesion that appears to both the diaphragma sellae. Neurosurgical consultation was obtained.  It was advised that the patient be admitted for general workup and other causes of her malaise and fatigue in addition to obtaining an endocrine panel to see if there is been significant endocrine dysfunction.  At this time a serum cortisols within limits of normal TSH is normal FSH and LH are normal for her condition prolactin level is slightly increased as would be expected with a stalk effect from a mass in the pituitary. Growth hormone level is pending.  She has remained stable here in the hospital while undergoing observation.  Past Medical History  Diagnosis Date  . Chronic renal insufficiency   . Hypertension   . Hypokalemia   . DM (diabetes mellitus)   . Myocardial infarction     NSTEMI/DES LAD, 02/2010  . Shortness of breath   . Cancer     skin cancer  . Chronic diastolic heart failure     EF 60-65%, echo, 02/2012  . Valvular heart disease     Moderate MR; severe TR, echo, 02/2012  . Pulmonary hypertension     Severe (RVSP 63 mmHg), echo, 02/2012  . CAD (coronary artery disease)   . Carotid artery disease     96-43% RICA; 83% LICA, 07/1839  . HLD (hyperlipidemia)   . Atrial fibrillation   . Peripheral edema     Past Surgical History  Procedure Laterality Date  . Total abdominal hysterectomy    . Cholecystectomy    . Cardiac catheterization      with stent 2011  . Coronary stent  placement  2011  . Cataract extraction w/phaco  08/29/2011    Procedure: CATARACT EXTRACTION PHACO AND INTRAOCULAR LENS PLACEMENT (IOC);  Surgeon: Tonny Branch;  Location: [facility];  Service: Ophthalmology;  Laterality: Right;  CDE: 10.25  . Eye surgery  Sept. 2012    right KPE w/ IOL  . Cataract extraction w/phaco  09/08/2011    Procedure: CATARACT EXTRACTION PHACO AND INTRAOCULAR LENS PLACEMENT (IOC);  Surgeon: Tonny Branch;  Location: [facility];  Service: Ophthalmology;  Laterality: Left;  CDE: 10.77  . Skin cancer excision      multiple    Family History  Problem Relation Age of Onset  . Coronary artery disease Neg Hx   . Anesthesia problems Neg Hx   . Hypotension Neg Hx   . Malignant hyperthermia Neg Hx   . Pseudochol deficiency Neg Hx   . CAD Mother   . Heart attack Father   . Cancer Brother     colon    Social History:  reports that she has never smoked. She has never used smokeless tobacco. She reports that she does not drink alcohol or use illicit drugs.  Allergies: No Known Allergies  Medications: I have reviewed the patient's current medications.  Results for orders placed or performed during the hospital encounter of 08/21/15 (from the past 48 hour(s))  Basic metabolic panel  Status: Abnormal   Collection Time: 08/21/15  2:21 AM  Result Value Ref Range   Sodium 144 135 - 145 mmol/L   Potassium 4.1 3.5 - 5.1 mmol/L   Chloride 112 (H) 101 - 111 mmol/L   CO2 21 (L) 22 - 32 mmol/L   Glucose, Bld 40 (LL) 65 - 99 mg/dL    Comment: CRITICAL RESULT CALLED TO, READ BACK BY AND VERIFIED WITH: POINEDEXTER,M AT 0305 ON 08/21/15 BY ISLEY,B    BUN 66 (H) 6 - 20 mg/dL   Creatinine, Ser 2.88 (H) 0.44 - 1.00 mg/dL   Calcium 8.8 (L) 8.9 - 10.3 mg/dL   GFR calc non Af Amer 14 (L) >60 mL/min   GFR calc Af Amer 16 (L) >60 mL/min    Comment: (NOTE) The eGFR has been calculated using the CKD EPI equation. This calculation has not been validated in all clinical situations. eGFR's  persistently <60 mL/min signify possible Chronic Kidney Disease.    Anion gap 11 5 - 15  CBC     Status: Abnormal   Collection Time: 08/21/15  2:21 AM  Result Value Ref Range   WBC 7.3 4.0 - 10.5 K/uL   RBC 3.01 (L) 3.87 - 5.11 MIL/uL   Hemoglobin 9.4 (L) 12.0 - 15.0 g/dL   HCT 29.3 (L) 36.0 - 46.0 %   MCV 97.3 78.0 - 100.0 fL   MCH 31.2 26.0 - 34.0 pg   MCHC 32.1 30.0 - 36.0 g/dL   RDW 14.5 11.5 - 15.5 %   Platelets 329 150 - 400 K/uL  Cortisol     Status: None   Collection Time: 08/21/15  2:21 AM  Result Value Ref Range   Cortisol, Plasma 19.6 ug/dL    Comment: (NOTE) AM    6.7 - 22.6 ug/dL PM   <10.0       ug/dL Performed at [facility]   POC CBG, ED     Status: Abnormal   Collection Time: 08/21/15  3:10 AM  Result Value Ref Range   Glucose-Capillary 33 (LL) 65 - 99 mg/dL   Comment 1 Notify RN   Urinalysis, Routine w reflex microscopic (not at [facility])     Status: Abnormal   Collection Time: 08/21/15  3:53 AM  Result Value Ref Range   Color, Urine YELLOW YELLOW   APPearance CLEAR CLEAR   Specific Gravity, Urine 1.010 1.005 - 1.030   pH 5.5 5.0 - 8.0   Glucose, UA NEGATIVE NEGATIVE mg/dL   Hgb urine dipstick NEGATIVE NEGATIVE   Bilirubin Urine NEGATIVE NEGATIVE   Ketones, ur NEGATIVE NEGATIVE mg/dL   Protein, ur NEGATIVE NEGATIVE mg/dL   Urobilinogen, UA 0.2 0.0 - 1.0 mg/dL   Nitrite NEGATIVE NEGATIVE   Leukocytes, UA TRACE (A) NEGATIVE  Urine microscopic-add on     Status: Abnormal   Collection Time: 08/21/15  3:53 AM  Result Value Ref Range   Squamous Epithelial / LPF FEW (A) RARE   WBC, UA 7-10 <3 WBC/hpf  POC CBG, ED     Status: Abnormal   Collection Time: 08/21/15  4:03 AM  Result Value Ref Range   Glucose-Capillary 55 (L) 65 - 99 mg/dL   Comment 1 Notify RN    Comment 2 Document in Chart   POC CBG, ED     Status: Abnormal   Collection Time: 08/21/15  5:37 AM  Result Value Ref Range   Glucose-Capillary 116 (H) 65 - 99 mg/dL  CBG monitoring, ED  Status: Abnormal   Collection Time: 08/21/15  8:21 AM  Result Value Ref Range   Glucose-Capillary 206 (H) 65 - 99 mg/dL  Prolactin     Status: Abnormal   Collection Time: 08/21/15  8:59 AM  Result Value Ref Range   Prolactin 77.4 (H) 4.8 - 23.3 ng/mL    Comment: (NOTE) Performed At: [facility] [place], [place] [id number] Lindon Romp MD VP:[id number]   Luteinizing hormone     Status: None   Collection Time: 08/21/15  8:59 AM  Result Value Ref Range   LH 21.0 mIU/mL    Comment: (NOTE)                    Follicular phase        2.4 -  12.6                    Ovulation phase        14.0 -  95.6                    Luteal phase            1.0 -  11.4                    Postmenopausal          7.7 -  58.5 Performed At: [facility] [place], [place] [id number] Lindon Romp MD [id number]   Follicle stimulating hormone     Status: None   Collection Time: 08/21/15  8:59 AM  Result Value Ref Range   FSH 42.6 mIU/mL    Comment: (NOTE)                    Follicular phase        3.5 -  12.5                    Ovulation phase         4.7 -  21.5                    Luteal phase            1.7 -   7.7                    Postmenopausal         25.8 - 134.8 Performed At: [facility] [place], [place] [id number] Lindon Romp MD FB:[id number]   TSH     Status: None   Collection Time: 08/21/15  8:59 AM  Result Value Ref Range   TSH 0.379 0.350 - 4.500 uIU/mL  Estradiol     Status: None   Collection Time: 08/21/15  8:59 AM  Result Value Ref Range   Estradiol <5.0 pg/mL    Comment: (NOTE)                    Adult Female:                      Follicular phase   82.4 -   166.0                      Ovulation phase    85.8 -   498.0  Luteal phase       43.8 -   211.0                      Postmenopausal     <6.0 -    54.7                    Pregnancy                      1st trimester      215.0 - >4300.0                    Girls (1-10 years)    6.0 -    27.0 Roche ECLIA methodology Performed At: [facility] [place], [place] [id number] Lindon Romp MD [id number]   Hepatic function panel     Status: Abnormal   Collection Time: 08/21/15  8:59 AM  Result Value Ref Range   Total Protein 6.0 (L) 6.5 - 8.1 g/dL   Albumin 2.8 (L) 3.5 - 5.0 g/dL   AST 24 15 - 41 U/L   ALT 18 14 - 54 U/L   Alkaline Phosphatase 48 38 - 126 U/L   Total Bilirubin 0.4 0.3 - 1.2 mg/dL   Bilirubin, Direct 0.1 0.1 - 0.5 mg/dL   Indirect Bilirubin 0.3 0.3 - 0.9 mg/dL  APTT     Status: None   Collection Time: 08/21/15  8:59 AM  Result Value Ref Range   aPTT 26 24 - 37 seconds  Protime-INR     Status: Abnormal   Collection Time: 08/21/15  8:59 AM  Result Value Ref Range   Prothrombin Time 15.4 (H) 11.6 - 15.2 seconds   INR 1.21 0.00 - 1.49  POC CBG, ED     Status: Abnormal   Collection Time: 08/21/15  2:32 PM  Result Value Ref Range   Glucose-Capillary 213 (H) 65 - 99 mg/dL  Glucose, capillary     Status: Abnormal   Collection Time: 08/21/15  4:28 PM  Result Value Ref Range   Glucose-Capillary 177 (H) 65 - 99 mg/dL  Glucose, capillary     Status: Abnormal   Collection Time: 08/21/15 10:01 PM  Result Value Ref Range   Glucose-Capillary 179 (H) 65 - 99 mg/dL   Comment 1 Notify RN    Comment 2 Document in Chart   Glucose, capillary     Status: Abnormal   Collection Time: 08/22/15  6:43 AM  Result Value Ref Range   Glucose-Capillary 114 (H) 65 - 99 mg/dL   Comment 1 Notify RN    Comment 2 Document in Chart   Basic metabolic panel     Status: Abnormal   Collection Time: 08/22/15  7:06 AM  Result Value Ref Range   Sodium 142 135 - 145 mmol/L   Potassium 4.7 3.5 - 5.1 mmol/L   Chloride 111 101 - 111 mmol/L   CO2 23 22 - 32 mmol/L   Glucose, Bld 158 (H) 65 - 99 mg/dL   BUN 56 (H) 6 - 20 mg/dL   Creatinine, Ser 2.73 (H) 0.44 - 1.00 mg/dL   Calcium 8.6 (L)  8.9 - 10.3 mg/dL   GFR calc non Af Amer 15 (L) >60 mL/min   GFR calc Af Amer 17 (L) >60 mL/min    Comment: (NOTE) The eGFR has been calculated using the CKD EPI equation. This calculation has not been validated in all  clinical situations. eGFR's persistently <60 mL/min signify possible Chronic Kidney Disease.    Anion gap 8 5 - 15  CBC     Status: Abnormal   Collection Time: 08/22/15  7:06 AM  Result Value Ref Range   WBC 4.8 4.0 - 10.5 K/uL   RBC 2.73 (L) 3.87 - 5.11 MIL/uL   Hemoglobin 8.1 (L) 12.0 - 15.0 g/dL   HCT 26.9 (L) 36.0 - 46.0 %   MCV 98.5 78.0 - 100.0 fL   MCH 29.7 26.0 - 34.0 pg   MCHC 30.1 30.0 - 36.0 g/dL   RDW 14.9 11.5 - 15.5 %   Platelets 269 150 - 400 K/uL    Mr Brain Wo Contrast  08/21/2015   CLINICAL DATA:  Weakness, stuttering gait, and vertigo for the past 3 weeks.  EXAM: MRI HEAD WITHOUT CONTRAST  TECHNIQUE: Multiplanar, multiecho pulse sequences of the brain and surrounding structures were obtained without intravenous contrast.  COMPARISON:  None.  FINDINGS: The pituitary is expanded, measuring 12.5 mm in cephalo caudad dimension. It is heterogeneous. The optic chiasm is slightly elevated. The cavernous sinus is within normal limits bilaterally.  The diffusion-weighted images demonstrate no evidence for acute or subacute infarction. Extensive periventricular and subcortical T2 changes are present bilaterally. White matter changes extend into the brainstem.  There is no definite flow in the left vertebral artery. There appears to be a fenestration of the vertebrobasilar junction without definite aneurysm. Anterior circulation is patent.  Bilateral lens replacements are noted. The globes and orbits are intact. The paranasal sinuses are clear. There is some fluid in the left mastoid air cells. No obstructing at nasopharyngeal lesion is present.  The skullbase is within normal limits. Midline structures are otherwise within normal limits.  IMPRESSION: 1. Enlargement of  the pituitary. 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 Please correlate with laboratory testing. Pituitary adenoma is also considered. Metastatic disease is considered in a basin of this a age, but less likely. 2. Atrophy and diffuse white matter disease. 3. Slow or occluded flow in the left vertebral artery without evidence for acute or subacute posterior fossa infarct. 4. Small left mastoid effusion. No obstructing nasopharyngeal lesion is present.   Electronically Signed   By: San Morelle M.D.   On: 08/21/2015 08:29    Review of Systems  Constitutional: Positive for malaise/fatigue.  HENT: Negative.   Eyes: Negative.   Respiratory: Negative.   Cardiovascular: Negative.   Genitourinary: Negative.   Musculoskeletal: Negative.   Skin: Negative.   Neurological: Positive for dizziness, tingling and sensory change.  Psychiatric/Behavioral: Negative.    Blood pressure 133/35, pulse 61, temperature 98.6 F (37 C), temperature source Oral, resp. rate 16, height '5\' 3"'  (1.6 m), weight 51.8 kg (114 lb 3.2 oz), SpO2 100 %. Physical Exam  Constitutional: She is oriented to person, place, and time. She appears well-developed and well-nourished.  HENT:  Head: Normocephalic and atraumatic.  Eyes: Conjunctivae are normal. Pupils are equal, round, and reactive to light.  Neck: Normal range of motion. Neck supple.  Neurological: She is alert and oriented to person, place, and time.  Pupils are 3 mm and equally reactive testing of her visual fields by course confrontational methods reveals that her visual fields are full. There is no evidence of a cortical drift her station and gait is normal today.  Skin: Skin is warm and dry.  Psychiatric: She has a normal mood and affect. Her behavior is normal. Judgment and thought content  normal.    Assessment/Plan: 12 mm pituitary lesion with minimal elevation of the diaphragma sella. Though the patient may in fact have a  pituitary adenoma if there is some recent enlargement of this lesion may be partially cause for her symptoms nonetheless if her hormone levels are within limits of normal at simply observe this condition for a period of time before considering any surgical intervention. I do not detect any major visual dysfunction that would make surgery eminent. Nonetheless the fact that the patient has been having some symptoms with this lesion present makes one concerned of a correlation between the 2. I will continue to follow this patient with you and I'll like to see her about 2 weeks down the road as an outpatient.  ELSNER,HENRY J 08/22/2015, 10:51 AM

## 2015-08-22 NOTE — Progress Notes (Addendum)
Initial Nutrition Assessment  DOCUMENTATION CODES:  Non-severe (moderate) malnutrition in context of chronic illness  INTERVENTION:  Glucerna Shake po BID, each supplement provides 220 kcal and 10 grams of protein  NUTRITION DIAGNOSIS:  Inadequate oral intake related to poor appetite, nausea as evidenced by loss of 7.5% bw in 3 months and energy intake < or equal to 75% for > or equal to 1 month.  GOAL:  Patient will meet greater than or equal to 90% of their needs  MONITOR:  PO intake, Supplement acceptance, I & O's, Labs  REASON FOR ASSESSMENT:  Malnutrition Screening Tool    ASSESSMENT:  81 y/o female PMHx CAD, CHF, DM, and atrial fibrillation presented with weakness, dizziness, HA, decreased appetite, and nausea. MRI of the brain done in the ED revealed an enlargement of the pituitary. Potentially represents pituitary apoplexy, an adenoma, or metastatic disease   Pt reports that she was eating at least 2 good meals a day up until a couple weeks ago when she lost her appetite and started having the aforementioned symptoms. She did not follow any diet at home and didn't drink any oral nutrition supplements. She believes she did take Vit C/Calcium.  She states her normal weight is about 120 lbs. She has lost 9-10 lbs in the last 3 months which is significant.   NFPE: No wasting/edema noted. Has collar bone exposed but says she has always looked like that  Diet Order:  Diet regular Room service appropriate?: Yes; Fluid consistency:: Thin  Skin: Dry Flaky  Last BM:  9/16  Height:  Ht Readings from Last 1 Encounters:  08/21/15 5\' 3"  (1.6 m)   Weight:  Wt Readings from Last 1 Encounters:  08/21/15 114 lb 3.2 oz (51.8 kg)   Wt Readings from Last 10 Encounters:  08/21/15 114 lb 3.2 oz (51.8 kg)  08/21/15 110 lb (49.896 kg)  08/14/15 109 lb (49.442 kg)  05/18/15 119 lb 12.8 oz (54.341 kg)  05/06/15 120 lb (54.432 kg)  02/09/15 128 lb (58.06 kg)  01/26/15 122 lb (55.339  kg)  10/22/14 120 lb 6.4 oz (54.613 kg)  07/18/14 122 lb (55.339 kg)  07/14/14 120 lb 12.8 oz (54.795 kg)   Ideal Body Weight:  52.3 kg  BMI:  Body mass index is 20.23 kg/(m^2).  Estimated Nutritional Needs:  Kcal:  1450-1600 (29-32 kcal/kg) Protein:  55-65 g Pro (1.1-1.3 g/kg bw) Fluid:  1.5 liters  EDUCATION NEEDS:  No education needs identified at this time  Burtis Junes RD, LDN Nutrition Pager: [phone] 08/22/2015 10:57 AM

## 2015-08-22 NOTE — Progress Notes (Signed)
[facility] Progress Note   Robin [name]  UUV:[id number]  DOB: 1928/11/14  DOA: 08/21/2015 PCP: Chevis Pretty, FNP  Brief narrative: Robin [name] is a 81 y.o. female with CAD, CHF, DM, A-fib who presents with dizziness (swimmy headed), generalized weakness, blurred vision, nausea and headaches x 2 wks.    Subjective: Most symptoms resolved. No headache, ambulated today with PT without dizziness. No nausea but still quite weak.   Assessment/Plan: Principal Problem:   Pituitary mass -Follow-up lab work to determine if hormone levels are elevated -Some of her symptoms have improved today- specifically she states that her headache has resolved and she is no longer feeling weak -Neurosurgery following to determine if there is a need for surgery -PT eval  Active Problems: Acute renal failure - chronic kidney disease stage IV -On Demadex at home for heart failure -hold Demadex - we are hydrating slowly -This may be the cause of her "dizziness and weakness"  -Monitor respiratory status carefully while hydrating -Follow I and O and repeat B mettomorrow    Chronic diastolic heart failure -See above     Atrial fibrillation -On aspirin and Plavix-has declined eliquis in the past  -Not on rate controlling agent due to junctional rhythm in the past  -Normal sinus rhythm on admission     Diabetes mellitus type 2 with complications -Amaryl on hold -all on sliding scale insulin    anemia  -Appears to be acute on chronic-follow while hydrating-check anemia panel tomorrow  -Check stool occult   Gout -Continue colchicine   Code Status:     Code Status Orders        Start     Ordered   08/21/15 1544  Full code   Continuous     08/21/15 1543     Family Communication:  DVT prophylaxis: SCd Consultants:NS   Antibiotics: Anti-infectives    Start     Dose/Rate Route Frequency Ordered Stop   08/21/15 0700  cefTRIAXone (ROCEPHIN) 1 g in dextrose 5 % 50 mL IVPB      1 g 100 mL/hr over 30 Minutes Intravenous  Once 08/21/15 0648 08/21/15 0834      Objective: Filed Weights   08/21/15 0141 08/21/15 1538  Weight: 49.896 kg (110 lb) 51.8 kg (114 lb 3.2 oz)    Intake/Output Summary (Last 24 hours) at 08/22/15 1143 Last data filed at 08/22/15 0841  Gross per 24 hour  Intake    420 ml  Output      0 ml  Net    420 ml     Vitals Filed Vitals:   08/21/15 1538 08/21/15 2201 08/22/15 0620 08/22/15 1035  BP: 151/38 110/30 117/39 133/35  Pulse: 69 67 68 61  Temp: 98.2 F (36.8 C) 98.2 F (36.8 C) 98.4 F (36.9 C) 98.6 F (37 C)  TempSrc: Oral Oral Oral Oral  Resp: 18 18 18 16   Height: 5\' 3"  (1.6 m)     Weight: 51.8 kg (114 lb 3.2 oz)     SpO2: 100% 100% 99% 100%    Exam:  General:  Pt is alert, not in acute distress  HEENT: No icterus, No thrush, oral mucosa moist  Cardiovascular: regular rate and rhythm, S1/S2 No murmur  Respiratory: clear to auscultation bilaterally   Abdomen: Soft, +Bowel sounds, non tender, non distended, no guarding  MSK: No LE edema, cyanosis or clubbing  Data Reviewed: Basic Metabolic Panel:  Recent Labs Lab 08/21/15 0221 08/22/15 0706  NA 144 142  K 4.1 4.7  CL 112* 111  CO2 21* 23  GLUCOSE 40* 158*  BUN 66* 56*  CREATININE 2.88* 2.73*  CALCIUM 8.8* 8.6*   Liver Function Tests:  Recent Labs Lab 08/21/15 0859  AST 24  ALT 18  ALKPHOS 48  BILITOT 0.4  PROT 6.0*  ALBUMIN 2.8*   No results for input(s): LIPASE, AMYLASE in the last 168 hours. No results for input(s): AMMONIA in the last 168 hours. CBC:  Recent Labs Lab 08/21/15 0221 08/22/15 0706  WBC 7.3 4.8  HGB 9.4* 8.1*  HCT 29.3* 26.9*  MCV 97.3 98.5  PLT 329 269   Cardiac Enzymes: No results for input(s): CKTOTAL, CKMB, CKMBINDEX, TROPONINI in the last 168 hours. BNP (last 3 results) No results for input(s): BNP in the last 8760 hours.  ProBNP (last 3 results) No results for input(s): PROBNP in the last 8760  hours.  CBG:  Recent Labs Lab 08/21/15 1432 08/21/15 1628 08/21/15 2201 08/22/15 0643 08/22/15 1055  GLUCAP 213* 177* 179* 114* 198*    No results found for this or any previous visit (from the past 240 hour(s)).   Studies: Mr Brain Wo Contrast  08/21/2015   CLINICAL DATA:  Weakness, stuttering gait, and vertigo for the past 3 weeks.  EXAM: MRI HEAD WITHOUT CONTRAST  TECHNIQUE: Multiplanar, multiecho pulse sequences of the brain and surrounding structures were obtained without intravenous contrast.  COMPARISON:  None.  FINDINGS: The pituitary is expanded, measuring 12.5 mm in cephalo caudad dimension. It is heterogeneous. The optic chiasm is slightly elevated. The cavernous sinus is within normal limits bilaterally.  The diffusion-weighted images demonstrate no evidence for acute or subacute infarction. Extensive periventricular and subcortical T2 changes are present bilaterally. White matter changes extend into the brainstem.  There is no definite flow in the left vertebral artery. There appears to be a fenestration of the vertebrobasilar junction without definite aneurysm. Anterior circulation is patent.  Bilateral lens replacements are noted. The globes and orbits are intact. The paranasal sinuses are clear. There is some fluid in the left mastoid air cells. No obstructing at nasopharyngeal lesion is present.  The skullbase is within normal limits. Midline structures are otherwise within normal limits.  IMPRESSION: 1. Enlargement of the pituitary. 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 Please correlate with laboratory testing. Pituitary adenoma is also considered. Metastatic disease is considered in a basin of this a age, but less likely. 2. Atrophy and diffuse white matter disease. 3. Slow or occluded flow in the left vertebral artery without evidence for acute or subacute posterior fossa infarct. 4. Small left mastoid effusion. No obstructing nasopharyngeal  lesion is present.   Electronically Signed   By: San Morelle M.D.   On: 08/21/2015 08:29    Scheduled Meds:  Scheduled Meds: . aspirin EC  81 mg Oral Daily  . atorvastatin  40 mg Oral q1800  . colchicine  0.3 mg Oral QODAY  . feeding supplement (GLUCERNA SHAKE)  237 mL Oral BID BM  . insulin aspart  0-9 Units Subcutaneous TID WC  . pantoprazole  40 mg Oral Daily  . potassium chloride  10 mEq Oral BID  . torsemide  40 mg Oral Daily   Continuous Infusions: . sodium chloride 50 mL/hr at 08/22/15 0841    Time spent on care of this pati35 min   [place], MD 08/22/2015, 11:43 AM    [facility] Office  [phone] Pager - Text Page per [email or web address] If 7PM-7AM, please  contact night-coverage [email or web address]

## 2015-08-22 NOTE — Evaluation (Signed)
Physical Therapy Evaluation Patient Details Name: Robin [name] MRN: [id number] DOB: Apr 16, 1928 Today's Date: 08/22/2015   History of Present Illness  Pt is an 81 yo female with a hx of CAD, CHF, DM, and atrial fibrillation who presented with weakness, dizziness, HA, decreased appetite, and nausea. 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 Admitted for further management and evaluation.   Clinical Impression  Pt admitted with above diagnosis. Pt currently with functional limitations due to the deficits listed below (see PT Problem List). At the time of PT eval pt was able to perform transfers and ambulation with min guard assist to a mod I level. Pt reports she is near baseline, however would like to look into HHPT to continue progressing at d/c. Pt will benefit from skilled PT to increase their independence and safety with mobility to allow discharge to the venue listed below.       Follow Up Recommendations Home health PT;Supervision - Intermittent    Equipment Recommendations  None recommended by PT    Recommendations for Other Services       Precautions / Restrictions Precautions Precautions: Fall Restrictions Weight Bearing Restrictions: No      Mobility  Bed Mobility Overal bed mobility: Modified Independent             General bed mobility comments: No physical assist required  Transfers Overall transfer level: Needs assistance Equipment used: None Transfers: Sit to/from Stand Sit to Stand: Supervision         General transfer comment: Supervision for safety. No physical assist required.   Ambulation/Gait Ambulation/Gait assistance: Min guard Ambulation Distance (Feet): 250 Feet Assistive device: None Gait Pattern/deviations: Step-through pattern;Decreased stride length;Trunk flexed Gait velocity: Decreased Gait velocity interpretation: Below normal  speed for age/gender General Gait Details: Slow but steady gait. Pt reports this is near baseline for her.   Stairs            Wheelchair Mobility    Modified Rankin (Stroke Patients Only)       Balance Overall balance assessment: Needs assistance Sitting-balance support: Feet supported;No upper extremity supported Sitting balance-Leahy Scale: Good     Standing balance support: No upper extremity supported Standing balance-Leahy Scale: Fair                               Pertinent Vitals/Pain Pain Assessment: No/denies pain    Home Living Family/patient expects to be discharged to:: Private residence Living Arrangements: Spouse/significant other;Children Available Help at Discharge: Family;Available 24 hours/day Type of Home: House Home Access: Stairs to enter   [employer] of Steps: 1 Home Layout: One level Home Equipment: Walker - 2 wheels;Cane - single point;Wheelchair - manual;Bedside commode Additional Comments: Daughter works nights    Prior Function Level of Independence: Independent               Journalist, newspaper   Dominant Hand: Right    Extremity/Trunk Assessment   Upper Extremity Assessment: Defer to OT evaluation           Lower Extremity Assessment: Overall WFL for tasks assessed      Cervical / Trunk Assessment: Kyphotic  Communication   Communication: HOH  Cognition Arousal/Alertness: Awake/alert Behavior During Therapy: WFL for tasks assessed/performed Overall Cognitive Status: Within Functional Limits for tasks assessed  General Comments      Exercises        Assessment/Plan    PT Assessment Patient needs continued PT services  PT Diagnosis Generalized weakness   PT Problem List Decreased strength;Decreased range of motion;Decreased activity tolerance;Decreased balance;Decreased mobility;Decreased knowledge of use of DME;Decreased safety awareness;Decreased  knowledge of precautions  PT Treatment Interventions DME instruction;Gait training;Stair training;Functional mobility training;Therapeutic activities;Therapeutic exercise;Neuromuscular re-education;Patient/family education   PT Goals (Current goals can be found in the Care Plan section) Acute Rehab PT Goals Patient Stated Goal: Home as soon as possible PT Goal Formulation: With patient Time For Goal Achievement: 08/29/15 Potential to Achieve Goals: Good    Frequency Min 3X/week   Barriers to discharge        Co-evaluation               End of Session Equipment Utilized During Treatment: Gait belt Activity Tolerance: Patient tolerated treatment well Patient left: in chair;with call bell/phone within reach;with chair alarm set Nurse Communication: Mobility status    Functional Assessment Tool Used: Clinical judgement Functional Limitation: Mobility: Walking and moving around Mobility: Walking and Moving Around Current Status [phone]): At least 1 percent but less than 20 percent impaired, limited or restricted Mobility: Walking and Moving Around Goal Status [phone]): At least 1 percent but less than 20 percent impaired, limited or restricted    Time: 0850-0917 PT Time Calculation (min) (ACUTE ONLY): 27 min   Charges:   PT Evaluation $Initial PT Evaluation Tier I: 1 Procedure PT Treatments $Gait Training: 8-22 mins   PT G Codes:   PT G-Codes **NOT FOR INPATIENT CLASS** Functional Assessment Tool Used: Clinical judgement Functional Limitation: Mobility: Walking and moving around Mobility: Walking and Moving Around Current Status (O9629): At least 1 percent but less than 20 percent impaired, limited or restricted Mobility: Walking and Moving Around Goal Status [phone]): At least 1 percent but less than 20 percent impaired, limited or restricted    Rolinda Roan 08/22/2015, 12:31 PM   Rolinda Roan, PT, DPT Acute Rehabilitation Services Pager: [phone]

## 2015-08-23 DIAGNOSIS — E221 Hyperprolactinemia: Secondary | ICD-10-CM

## 2015-08-23 DIAGNOSIS — I6330 Cerebral infarction due to thrombosis of unspecified cerebral artery: Secondary | ICD-10-CM | POA: Insufficient documentation

## 2015-08-23 DIAGNOSIS — E162 Hypoglycemia, unspecified: Secondary | ICD-10-CM

## 2015-08-23 DIAGNOSIS — D509 Iron deficiency anemia, unspecified: Secondary | ICD-10-CM

## 2015-08-23 LAB — BASIC METABOLIC PANEL
Anion gap: 10 (ref 5–15)
BUN: 47 mg/dL — AB (ref 6–20)
CALCIUM: 8.5 mg/dL — AB (ref 8.9–10.3)
CO2: 21 mmol/L — ABNORMAL LOW (ref 22–32)
CREATININE: 2.43 mg/dL — AB (ref 0.44–1.00)
Chloride: 117 mmol/L — ABNORMAL HIGH (ref 101–111)
GFR calc Af Amer: 20 mL/min — ABNORMAL LOW (ref >60)
GFR, EST NON AFRICAN AMERICAN: 17 mL/min — AB (ref >60)
GLUCOSE: 157 mg/dL — AB (ref 65–99)
Potassium: 4 mmol/L (ref 3.5–5.1)
Sodium: 148 mmol/L — ABNORMAL HIGH (ref 135–145)

## 2015-08-23 LAB — CBC
HCT: 27.8 % — ABNORMAL LOW (ref 36.0–46.0)
Hemoglobin: 8.5 g/dL — ABNORMAL LOW (ref 12.0–15.0)
MCH: 30.6 pg (ref 26.0–34.0)
MCHC: 30.6 g/dL (ref 30.0–36.0)
MCV: 100 fL (ref 78.0–100.0)
Platelets: 267 10*3/uL (ref 150–400)
RBC: 2.78 MIL/uL — ABNORMAL LOW (ref 3.87–5.11)
RDW: 14.6 % (ref 11.5–15.5)
WBC: 5.6 10*3/uL (ref 4.0–10.5)

## 2015-08-23 LAB — GLUCOSE, CAPILLARY
GLUCOSE-CAPILLARY: 177 mg/dL — AB (ref 65–99)
Glucose-Capillary: 133 mg/dL — ABNORMAL HIGH (ref 65–99)

## 2015-08-23 MED ORDER — FERROUS SULFATE 325 (65 FE) MG PO TABS: 325.0000 mg | ORAL_TABLET | Freq: Two times a day (BID) | ORAL | Status: DC

## 2015-08-23 MED ORDER — ONDANSETRON HCL 4 MG PO TABS: 4.0000 mg | ORAL_TABLET | Freq: Three times a day (TID) | ORAL | Status: DC | PRN

## 2015-08-23 MED ORDER — GLUCERNA SHAKE PO LIQD: 237.0000 mL | Freq: Two times a day (BID) | ORAL | Status: AC

## 2015-08-23 NOTE — Progress Notes (Signed)
Orthostatics obtained. No dizziness per pt. Ambulated 300 ft without dizziness. Susie Cassette

## 2015-08-23 NOTE — Progress Notes (Signed)
Patient ID: Robin [name], female   DOB: 02/07/1928, 81 y.o.   MRN: [id number] Vital signs are stable Neurologically patient is doing well She may be discharged from surgical standpoint I will follow her up as an outpatient in 2-3 weeks' time. Please have patient call [phone] ask for Caren Griffins to get appointment

## 2015-08-23 NOTE — Discharge Summary (Addendum)
Physician Discharge Summary  Robin [name] WYO:[id number] DOB: 16-May-1928 DOA: 08/21/2015  PCP: Chevis Pretty, FNP  Admit date: 08/21/2015 Discharge date: 08/23/2015  Time spent: 60 minutes  Recommendations for Outpatient Follow-up:  1. Stool occult  - follow-up on anemia 2. F/u elevated Prolactin level with discussion with endocrine 3. B met in 4 days 4. Orthostatic vitals at next office visit  Discharge Condition: Stable Diet recommendation: Diabetic low sodium heart healthy  Discharge Diagnoses:  Principal Problem:   Pituitary mass Active Problems:   Generalized weakness   Blurred vision   Headache   AKI (acute kidney injury)/ CKD (chronic kidney disease), stage IV   Hypoglycemia - Diabetes mellitus type 2 with complications   Anemia in CKD (chronic kidney disease)/iron deficiency anemia   Hyperprolactinemia   CORONARY ATHEROSCLEROSIS NATIVE CORONARY ARTERY   Chronic diastolic heart failure   Atrial fibrillation   Valvular heart disease   Malnutrition of moderate degree   Cerebral thrombosis with cerebral infarction   History of present illness:  Robin [name] is a 81 y.o. female with CAD, CHF, DM, A-fib who presents with dizziness (swimmy headed), generalized weakness, blurred vision, nausea and headaches x 2 wks. apparently she was seen at [facility] about 2 weeks ago, had an unremarkable workup and sent home with Reglan. She continued to have headaches which she described as frontal headaches. These improved when she would lay down and take Tylenol. She also states that she had increased generalized weakness and was getting surgery headed when trying to walk. On 9/15 she also noted generalized blurred vision. On 9/16 she was diaphoretic and off-balance and therefore returned to the ER. An MRI of the brain in the ER revealed enlargement of the pituitary and blood work revealed acute renal failure. She was also found to have a blood sugar of 33 which was  repeated in an hour and was 55. She was admitted for further workup and treatment. She was transferred from [facility] to [facility] for neurosurgical eval.  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  - symptoms have improved today- specifically she states that her headaches have resolved and she is no longer feeling weak, lightheaded or having blurred vision- doubtful symptoms were related to pituitary mass as they have resolved rapidly with IV fluids and correction of hypoglycemia.  -Neurosurgery, Dr Ellene Route has evaluated her today and yesterday- he does not feel that she needs surgery at this time-he will follow her up in the office in 2-3 weeks -PT eval- recommended home health PT which has been ordered  Active Problems:  Generalized symptoms of dizziness/weakness/headache- likely due to below  (A) Acute renal failure - chronic kidney disease stage IV -On Demadex at home for heart failure -holding  Demadex - given slow IV hydration-creatinine has improved slightly-symptoms of dizziness and weakness have resolved although orthostatic vitals are still positive-she has ambulated up and down the hall without any symptoms and at this point I will stop hydration but as orthostatic vitals are still positive, I will not yet resume her Demadex -have recommended that they weigh her daily at home-she is to follow-up with  her PCP in 4 days to determine if Demadex can be resumed and at what dose- have discussed with daughter in detail  (B)Hypoglycemia/ diabetes mellitus type 2 -As mentioned above blood sugar was 33 on arrival to the ER-daughter states that the patient  has not been eating and drinking well at home lately -We monitored her off of Amaryl and Lantus-sugars increased to maximum about 198 -At this time I have recommended not to resume the Lantus but to continue 2 mg of Amaryl daily and work on increasing PO  intake -I have advised that they continue to check her sugars 3 times a day and if sugars are consistently greater than 200, they can call the PCP for further instructions-have recommended that they take her glucose readings with her at this follow-up appointment  (C) Anemia  -Appears to be acute on chronic- hemoglobin 11.6 on 05/18/15 and 8.1 now -Anemia panel reveals that she is iron deficient with an elevated iron binding capacity-ferrous sulfate started -stool occult ordered but patient has not had a BM-recommend outpatient stool occults    Chronic diastolic heart failure -See above - pulse ox on 100% on room air-lungs clear to auscultation bilaterally-no shortness of breath on exertion -Weight on discharge today is 114 pounds   Atrial fibrillation -On aspirin and Plavix-has declined eliquis in the past  -Not on rate controlling agent due to junctional rhythm in the past   Moderate malnutrition -Due to poor by mouth intake recently-started Glucerna  Gout -Continue colchicine  Consultations:  Neurosurgery  Discharge Exam: Filed Weights   08/21/15 0141 08/21/15 1538  Weight: 49.896 kg (110 lb) 51.8 kg (114 lb 3.2 oz)   Filed Vitals:   08/23/15 0927  BP: 158/50  Pulse: 76  Temp: 98.8 F (37.1 C)  Resp: 18    General: AAO x 3, no distress Cardiovascular: RRR, no murmurs  Respiratory: clear to auscultation bilaterally GI: soft, non-tender, non-distended, bowel sound positive  Discharge Instructions You were cared for by a hospitalist during your hospital stay. If you have any questions about your discharge medications or the care you received while you were in the hospital after you are discharged, you can call  the unit and asked to speak with the hospitalist on call if the hospitalist that took care of you is not available. Once you are discharged, your primary care physician will handle any further medical issues. Please note that NO REFILLS for any discharge medications will be authorized once you are discharged, as it is imperative that you return to your primary care physician (or establish a relationship with a primary care physician if you do not have one) for your aftercare needs so that they can reassess your need for medications and monitor your lab values.      Discharge Instructions    (HEART FAILURE PATIENTS) Call MD:  Anytime you have any of the following symptoms: 1) 3 pound weight gain in 24 hours or 5 pounds in 1 week 2) shortness of breath, with or without a dry hacking cough 3) swelling in the hands, feet or stomach 4) if you have to sleep on extra pillows at night in order to breathe.    Complete by:  As directed      Call MD for:  difficulty breathing, headache or visual disturbances    Complete by:  As directed      Discharge instructions    Complete by:  As directed   Diabetic low sodium heart healthy diet  Increase activity slowly    Complete by:  As directed             Medication List    STOP taking these medications        LANTUS SOLOSTAR 100 UNIT/ML Solostar Pen  Generic drug:  Insulin Glargine     potassium chloride 10 MEQ tablet  Commonly known as:  K-DUR     torsemide 20 MG tablet  Commonly known as:  DEMADEX      TAKE these medications        aspirin EC 81 MG tablet  Take 1 tablet (81 mg total) by mouth daily.     atorvastatin 40 MG tablet  Commonly known as:  LIPITOR  TAKE ONE (1) TABLET EACH DAY     cholecalciferol 1000 UNITS tablet  Commonly known as:  VITAMIN D  Take 1,000 Units by mouth daily.     clopidogrel 75 MG tablet  Commonly known as:  PLAVIX  TAKE ONE (1) TABLET EACH DAY     COLCRYS 0.6 MG tablet  Generic drug:  colchicine   Take 0.5 tablets (0.3 mg total) by mouth daily.     feeding supplement (GLUCERNA SHAKE) Liqd  Take 237 mLs by mouth 2 (two) times daily between meals.     fenofibrate micronized 134 MG capsule  Commonly known as:  LOFIBRA  Take 1 capsule (134 mg total) by mouth daily before breakfast.     ferrous sulfate 325 (65 FE) MG tablet  Take 1 tablet (325 mg total) by mouth 2 (two) times daily with a meal.     glimepiride 4 MG tablet  Commonly known as:  AMARYL  TAKE ONE (1) TABLET EACH DAY     lisinopril 20 MG tablet  Commonly known as:  PRINIVIL,ZESTRIL  Take 1 tablet (20 mg total) by mouth 2 (two) times daily.     metoCLOPramide 10 MG tablet  Commonly known as:  REGLAN  Take 5 mg by mouth daily as needed for nausea or vomiting.     nitroGLYCERIN 0.4 MG SL tablet  Commonly known as:  NITROSTAT  Place 1 tablet (0.4 mg total) under the tongue every 5 (five) minutes x 3 doses as needed.     omeprazole 40 MG capsule  Commonly known as:  PRILOSEC  Take 1 capsule (40 mg total) by mouth daily.     ondansetron 4 MG tablet  Commonly known as:  ZOFRAN  Take 1 tablet (4 mg total) by mouth every 8 (eight) hours as needed for nausea or vomiting.     UNIFINE PENTIPS 32G X 4 MM Misc  Generic drug:  Insulin Pen Needle  EVERY DAY       No Known Allergies Follow-up Information    Follow up with Earleen Newport, MD In 2 weeks.   Specialty:  Neurosurgery   Why:  ask to speak with cynthia for an appt in 2-3 wks   Contact information:   [address] [place] [address] [phone]       Follow up with Chevis Pretty, FNP.   Specialty:  Nurse Practitioner   Why:  On this Friday   Contact information:   [place]  [address] [phone]        The results of significant diagnostics from this hospitalization (including imaging, microbiology, ancillary and laboratory) are listed below for reference.    Significant Diagnostic  Studies: Dg Abd 1 View  08/14/2015   CLINICAL  DATA:  Left lower quadrant pain for the past month  EXAM: ABDOMEN - 1 VIEW  COMPARISON:  None in PACs  FINDINGS: Moderately increased colonic stool burden diffusely. There is no small or large bowel obstructive pattern. There are no abnormal soft tissue calcifications. There is moderate S shaped thoracolumbar curvature. There are degenerative changes at multiple lumbar disc levels.  IMPRESSION: Increase colonic stool burden consistent with clinical constipation. No acute intra-abdominal abnormality is observed.   Electronically Signed   By: David  Martinique M.D.   On: 08/14/2015 15:56   Mr Brain Wo Contrast  08/21/2015   CLINICAL DATA:  Weakness, stuttering gait, and vertigo for the past 3 weeks.  EXAM: MRI HEAD WITHOUT CONTRAST  TECHNIQUE: Multiplanar, multiecho pulse sequences of the brain and surrounding structures were obtained without intravenous contrast.  COMPARISON:  None.  FINDINGS: The pituitary is expanded, measuring 12.5 mm in cephalo caudad dimension. It is heterogeneous. The optic chiasm is slightly elevated. The cavernous sinus is within normal limits bilaterally.  The diffusion-weighted images demonstrate no evidence for acute or subacute infarction. Extensive periventricular and subcortical T2 changes are present bilaterally. White matter changes extend into the brainstem.  There is no definite flow in the left vertebral artery. There appears to be a fenestration of the vertebrobasilar junction without definite aneurysm. Anterior circulation is patent.  Bilateral lens replacements are noted. The globes and orbits are intact. The paranasal sinuses are clear. There is some fluid in the left mastoid air cells. No obstructing at nasopharyngeal lesion is present.  The skullbase is within normal limits. Midline structures are otherwise within normal limits.  IMPRESSION: 1. Enlargement of the pituitary. 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 Please correlate with laboratory testing. Pituitary adenoma is also considered. Metastatic disease is considered in a basin of this a age, but less likely. 2. Atrophy and diffuse white matter disease. 3. Slow or occluded flow in the left vertebral artery without evidence for acute or subacute posterior fossa infarct. 4. Small left mastoid effusion. No obstructing nasopharyngeal lesion is present.   Electronically Signed   By: San Morelle M.D.   On: 08/21/2015 08:29    Microbiology: Recent Results (from the past 240 hour(s))  Urine culture     Status: None   Collection Time: 08/21/15  6:50 AM  Result Value Ref Range Status   Specimen Description URINE, CLEAN CATCH  Final   Special Requests Normal  Final   Culture   Final    NO GROWTH 1 DAY Performed at [facility]    Report Status 08/22/2015 FINAL  Final     Labs: Basic Metabolic Panel:  Recent Labs Lab 08/21/15 0221 08/22/15 0706 08/23/15 0556  NA 144 142 148*  K 4.1 4.7 4.0  CL 112* 111 117*  CO2 21* 23 21*  GLUCOSE 40* 158* 157*  BUN 66* 56* 47*  CREATININE 2.88* 2.73* 2.43*  CALCIUM 8.8* 8.6* 8.5*   Liver Function Tests:  Recent Labs Lab 08/21/15 0859  AST 24  ALT 18  ALKPHOS 48  BILITOT 0.4  PROT 6.0*  ALBUMIN 2.8*   No results for input(s): LIPASE, AMYLASE in the last 168 hours. No results for input(s): AMMONIA in the last 168 hours. CBC:  Recent Labs Lab 08/21/15 0221 08/22/15 0706 08/23/15 0556  WBC 7.3 4.8 5.6  HGB 9.4* 8.1* 8.5*  HCT 29.3* 26.9* 27.8*  MCV 97.3 98.5 100.0  PLT 329 269 267   Cardiac Enzymes: No  results for input(s): CKTOTAL, CKMB, CKMBINDEX, TROPONINI in the last 168 hours. BNP: BNP (last 3 results) No results for input(s): BNP in the last 8760 hours.  ProBNP (last 3 results) No results for input(s): PROBNP in the last 8760 hours.  CBG:  Recent Labs Lab 08/22/15 0643 08/22/15 1055 08/22/15 1649 08/23/15 0653 08/23/15 1129   GLUCAP 114* 198* 186* 133* 177*       SignedDebbe Odea, MD [facility] 08/23/2015, 1:13 PM

## 2015-08-24 ENCOUNTER — Ambulatory Visit: Payer: Medicare Other | Admitting: Nurse Practitioner

## 2015-08-24 ENCOUNTER — Telehealth: Payer: Self-pay | Admitting: Nurse Practitioner

## 2015-08-24 NOTE — Telephone Encounter (Signed)
Appointment given for Friday @ 9:00am.

## 2015-08-27 ENCOUNTER — Telehealth: Payer: Self-pay | Admitting: Cardiovascular Disease

## 2015-08-27 NOTE — Telephone Encounter (Signed)
Spoke with [name] in ref to scheduling DOPPLER CAROTID -Carotid artery disease  That is due per Outpatient Vascular Lab Report.  She wants to wait until after her visit with Dr Bronson Ing on [address] to schedule

## 2015-08-28 ENCOUNTER — Ambulatory Visit (INDEPENDENT_AMBULATORY_CARE_PROVIDER_SITE_OTHER): Payer: Medicare Other | Admitting: Nurse Practitioner

## 2015-08-28 ENCOUNTER — Encounter: Payer: Self-pay | Admitting: Nurse Practitioner

## 2015-08-28 VITALS — BP 148/48 | HR 64 | Temp 97.9°F | Ht 63.0 in | Wt 117.0 lb

## 2015-08-28 DIAGNOSIS — D443 Neoplasm of uncertain behavior of pituitary gland: Secondary | ICD-10-CM

## 2015-08-28 DIAGNOSIS — IMO0001 Reserved for inherently not codable concepts without codable children: Secondary | ICD-10-CM

## 2015-08-28 DIAGNOSIS — D497 Neoplasm of unspecified behavior of endocrine glands and other parts of nervous system: Secondary | ICD-10-CM

## 2015-08-28 DIAGNOSIS — I6523 Occlusion and stenosis of bilateral carotid arteries: Secondary | ICD-10-CM

## 2015-08-28 DIAGNOSIS — Z09 Encounter for follow-up examination after completed treatment for conditions other than malignant neoplasm: Secondary | ICD-10-CM | POA: Diagnosis not present

## 2015-08-28 DIAGNOSIS — I10 Essential (primary) hypertension: Secondary | ICD-10-CM

## 2015-08-28 DIAGNOSIS — E785 Hyperlipidemia, unspecified: Secondary | ICD-10-CM

## 2015-08-28 DIAGNOSIS — E1065 Type 1 diabetes mellitus with hyperglycemia: Secondary | ICD-10-CM | POA: Diagnosis not present

## 2015-08-28 DIAGNOSIS — Z794 Long term (current) use of insulin: Secondary | ICD-10-CM

## 2015-08-28 DIAGNOSIS — E1165 Type 2 diabetes mellitus with hyperglycemia: Secondary | ICD-10-CM

## 2015-08-28 LAB — POCT GLYCOSYLATED HEMOGLOBIN (HGB A1C): HEMOGLOBIN A1C: 5.9

## 2015-08-28 NOTE — Progress Notes (Signed)
   Subjective:    Patient ID: Robin [name], female    DOB: 1928-02-01, 81 y.o.   MRN: [id number]  HPI Patient brought in by daughter for hospital follow up. SHe was taken to hospital on 08/21/15 with weakness, blurred vision and unsteady on feet. Multiple test were run and they discovered a pituitary tumor. According to daughter they are just going to watch it for now- she has follow  Up with neurosurgeon in 2 weeks. She is doing okay since she has been home. SHe was put on iron tablets which made her sick so she stopped taking. Her only complaint today  Is continued blurred vision. The only med change was they stopped her sliding scale  Insulin. Blood sugar have been running good.    Review of Systems  Constitutional: Positive for fatigue.  HENT: Negative.   Eyes: Positive for visual disturbance.  Respiratory: Negative.   Cardiovascular: Negative.   Gastrointestinal: Negative.   Genitourinary: Negative.   Neurological: Negative.   Psychiatric/Behavioral: Negative.   All other systems reviewed and are negative.      Objective:   Physical Exam  Constitutional: She is oriented to person, place, and time. She appears well-developed and well-nourished.  Cardiovascular: Normal rate, regular rhythm and normal heart sounds.   Pulmonary/Chest: Effort normal and breath sounds normal.  Abdominal: Soft. Bowel sounds are normal.  Neurological: She is alert and oriented to person, place, and time.  Skin: Skin is warm and dry.  Psychiatric: She has a normal mood and affect. Her behavior is normal. Judgment and thought content normal.    BP 148/48 mmHg  Pulse 64  Temp(Src) 97.9 F (36.6 C) (Oral)  Ht $R'5\' 3"'ci$  (1.6 m)  Wt 117 lb (53.071 kg)  BMI 20.73 kg/m2  Results for orders placed or performed in visit on 08/28/15  POCT glycosylated hemoglobin (Hb A1C)  Result Value Ref Range   Hemoglobin A1C 5.9         Assessment & Plan:  1. Pituitary tumor Keep follow up with neurosurgeon  2.  Hospital discharge follow-up  3. Essential hypertension Do not qdd salt todiet - CMP14+EGFR  4. Diabetes mellitus, insulin dependent (IDDM), uncontrolled Continue  To watch carbs - POCT glycosylated hemoglobin (Hb A1C)  5. Hyperlipemia Low fat diet - Lipid panel   Labs pending Follow up in 3 months  [place], FNP

## 2015-08-29 ENCOUNTER — Other Ambulatory Visit: Payer: Self-pay | Admitting: Nurse Practitioner

## 2015-08-29 LAB — CMP14+EGFR
A/G RATIO: 1.4 (ref 1.1–2.5)
ALT: 21 IU/L (ref 0–32)
AST: 29 IU/L (ref 0–40)
Albumin: 3.3 g/dL — ABNORMAL LOW (ref 3.5–4.7)
Alkaline Phosphatase: 48 IU/L (ref 39–117)
BUN/Creatinine Ratio: 18 (ref 11–26)
BUN: 28 mg/dL — ABNORMAL HIGH (ref 8–27)
Bilirubin Total: 0.4 mg/dL (ref 0.0–1.2)
CALCIUM: 8.8 mg/dL (ref 8.7–10.3)
CO2: 18 mmol/L (ref 18–29)
Chloride: 111 mmol/L — ABNORMAL HIGH (ref 97–108)
Creatinine, Ser: 1.58 mg/dL — ABNORMAL HIGH (ref 0.57–1.00)
GFR calc Af Amer: 34 mL/min/{1.73_m2} — ABNORMAL LOW (ref >59)
GFR, EST NON AFRICAN AMERICAN: 30 mL/min/{1.73_m2} — AB (ref >59)
Globulin, Total: 2.3 g/dL (ref 1.5–4.5)
Glucose: 103 mg/dL — ABNORMAL HIGH (ref 65–99)
POTASSIUM: 5.7 mmol/L — AB (ref 3.5–5.2)
Sodium: 145 mmol/L — ABNORMAL HIGH (ref 134–144)
Total Protein: 5.6 g/dL — ABNORMAL LOW (ref 6.0–8.5)

## 2015-08-29 LAB — LIPID PANEL
CHOL/HDL RATIO: 4.9 ratio — AB (ref 0.0–4.4)
CHOLESTEROL TOTAL: 136 mg/dL (ref 100–199)
HDL: 28 mg/dL — AB (ref >39)
LDL Calculated: 78 mg/dL (ref 0–99)
TRIGLYCERIDES: 149 mg/dL (ref 0–149)
VLDL Cholesterol Cal: 30 mg/dL (ref 5–40)

## 2015-09-07 ENCOUNTER — Telehealth: Payer: Self-pay | Admitting: Nurse Practitioner

## 2015-09-07 ENCOUNTER — Ambulatory Visit: Payer: Medicare Other | Admitting: Nurse Practitioner

## 2015-09-07 NOTE — Telephone Encounter (Signed)
Informed daughter of NTBS per Mary-Margaret. Pt has an appt w/ the cardiologist for this Wednesday 10/5, Mary-Margaret's first available is Tues 10/18. This appt was made but maybe cancelled pt pt if the cardiologist changes her medications this week.

## 2015-09-07 NOTE — Telephone Encounter (Signed)
NTBS.

## 2015-09-07 NOTE — Telephone Encounter (Signed)
Hospital stopped pt's lasix 2 wks ago. Pt's legs started swelling again last Wednesday. Daughter started Lasix back, first 1 in the am & 1 in the pm, then increased to 2 in the am & 1 in the pm as this is how she was taking it before, but this has not decreased the swelling. Does she need to be put on another medication to help decrease the swelling. Daughter says one time there was another med added that help the swelling but can not remember the name of it.

## 2015-09-16 ENCOUNTER — Ambulatory Visit (INDEPENDENT_AMBULATORY_CARE_PROVIDER_SITE_OTHER): Payer: Medicare Other | Admitting: Cardiovascular Disease

## 2015-09-16 ENCOUNTER — Encounter: Payer: Self-pay | Admitting: Cardiovascular Disease

## 2015-09-16 VITALS — BP 154/83 | HR 64 | Ht 64.0 in | Wt 114.0 lb

## 2015-09-16 DIAGNOSIS — I2510 Atherosclerotic heart disease of native coronary artery without angina pectoris: Secondary | ICD-10-CM

## 2015-09-16 DIAGNOSIS — I38 Endocarditis, valve unspecified: Secondary | ICD-10-CM

## 2015-09-16 DIAGNOSIS — I6523 Occlusion and stenosis of bilateral carotid arteries: Secondary | ICD-10-CM

## 2015-09-16 DIAGNOSIS — Z87898 Personal history of other specified conditions: Secondary | ICD-10-CM

## 2015-09-16 DIAGNOSIS — I272 Other secondary pulmonary hypertension: Secondary | ICD-10-CM

## 2015-09-16 DIAGNOSIS — I480 Paroxysmal atrial fibrillation: Secondary | ICD-10-CM

## 2015-09-16 DIAGNOSIS — Z9289 Personal history of other medical treatment: Secondary | ICD-10-CM

## 2015-09-16 DIAGNOSIS — I5032 Chronic diastolic (congestive) heart failure: Secondary | ICD-10-CM

## 2015-09-16 DIAGNOSIS — I10 Essential (primary) hypertension: Secondary | ICD-10-CM | POA: Diagnosis not present

## 2015-09-16 NOTE — Patient Instructions (Signed)
Continue all current medications. Your physician wants you to follow up in: 6 months.  You will receive a reminder letter in the mail one-two months in advance.  If you don't receive a letter, please call our office to schedule the follow up appointment   

## 2015-09-16 NOTE — Progress Notes (Signed)
Patient ID: Robin [name], female   DOB: 1928-03-09, 81 y.o.   MRN: [id number]      SUBJECTIVE: Robin [name] presents for follow up of chronic diastolic heart failure, CAD (stent in 2011), atrial fibrillation, carotid artery stenosis, and valvular heart disease.  Her most recent echo in March 2013 revealed normal LV systolic function, EF 29-52%, diastolic dysfunction showing restrictive physiology, elevated LVEDP, moderate MR, severe TR, severe pulmonary hypertension (63 mmHg), a small pericardial effusion, and a dilated IVC. Most recent carotid Dopplers demonstrated mild bilateral disease.  I previously prescribed low-dose Eliquis but she preferred to stay on the aspirin and Plavix.  She was hospitalized in September and was found to have a pituitary tumor.  Her weight is stable today at 114 pounds which was her discharge weight.  She is gradually regaining her strength. Denies chest pain. Has occasional episodes of shortness of breath. Now back on diuretic and leg swelling has markedly improved.    Review of Systems: As per "subjective", otherwise negative.  No Known Allergies  Current Outpatient Prescriptions  Medication Sig Dispense Refill  . aspirin EC 81 MG tablet Take 1 tablet (81 mg total) by mouth daily.    Marland Kitchen atorvastatin (LIPITOR) 40 MG tablet TAKE ONE (1) TABLET EACH DAY 30 tablet 3  . cholecalciferol (VITAMIN D) 1000 UNITS tablet Take 1,000 Units by mouth daily.    . clopidogrel (PLAVIX) 75 MG tablet TAKE ONE (1) TABLET EACH DAY 30 tablet 5  . COLCRYS 0.6 MG tablet Take 0.5 tablets (0.3 mg total) by mouth daily. 30 tablet 2  . feeding supplement, GLUCERNA SHAKE, (GLUCERNA SHAKE) LIQD Take 237 mLs by mouth 2 (two) times daily between meals. 30 Can 0  . fenofibrate micronized (LOFIBRA) 134 MG capsule Take 1 capsule (134 mg total) by mouth daily before breakfast. 30 capsule 5  . glimepiride (AMARYL) 4 MG tablet Take 0.5 tablets by mouth daily.    Marland Kitchen LANTUS SOLOSTAR 100 UNIT/ML  Solostar Pen     . lisinopril (PRINIVIL,ZESTRIL) 20 MG tablet TAKE ONE TABLET BY MOUTH TWICE DAILY 60 tablet 5  . metoCLOPramide (REGLAN) 10 MG tablet Take 5 mg by mouth daily as needed for nausea or vomiting.     . nitroGLYCERIN (NITROSTAT) 0.4 MG SL tablet Place 1 tablet (0.4 mg total) under the tongue every 5 (five) minutes x 3 doses as needed. 25 tablet 3  . omeprazole (PRILOSEC) 40 MG capsule Take 1 capsule (40 mg total) by mouth daily. 30 capsule 3  . ondansetron (ZOFRAN) 4 MG tablet Take 1 tablet (4 mg total) by mouth every 8 (eight) hours as needed for nausea or vomiting. 20 tablet 0  . potassium chloride (K-DUR) 10 MEQ tablet Take 1 tablet by mouth 2 (two) times daily.    Marland Kitchen torsemide (DEMADEX) 20 MG tablet Take 1 tablet by mouth 2 (two) times daily as needed. Swelling; Some days, daughter gives her 2 tablets every other day    . TRADJENTA 5 MG TABS tablet     . UNIFINE PENTIPS 32G X 4 MM MISC EVERY DAY 100 each 2   No current facility-administered medications for this visit.    Past Medical History  Diagnosis Date  . Chronic renal insufficiency   . Hypertension   . Hypokalemia   . DM (diabetes mellitus) ([place])   . Myocardial infarction [facility])     NSTEMI/DES LAD, 02/2010  . Shortness of breath   . Cancer ([place])     skin cancer  .  Chronic diastolic heart failure (HCC)     EF 60-65%, echo, 02/2012  . Valvular heart disease     Moderate MR; severe TR, echo, 02/2012  . Pulmonary hypertension (HCC)     Severe (RVSP 63 mmHg), echo, 02/2012  . CAD (coronary artery disease)   . Carotid artery disease (HCC)     89-37% RICA; 34% LICA, 01/8767  . HLD (hyperlipidemia)   . Atrial fibrillation ([place])   . Peripheral edema     Past Surgical History  Procedure Laterality Date  . Total abdominal hysterectomy    . Cholecystectomy    . Cardiac catheterization      with stent 2011  . Coronary stent placement  2011  . Cataract extraction w/phaco  08/29/2011    Procedure: CATARACT EXTRACTION  PHACO AND INTRAOCULAR LENS PLACEMENT (IOC);  Surgeon: Tonny Branch;  Location: [facility];  Service: Ophthalmology;  Laterality: Right;  CDE: 10.25  . Eye surgery  Sept. 2012    right KPE w/ IOL  . Cataract extraction w/phaco  09/08/2011    Procedure: CATARACT EXTRACTION PHACO AND INTRAOCULAR LENS PLACEMENT (IOC);  Surgeon: Tonny Branch;  Location: [facility];  Service: Ophthalmology;  Laterality: Left;  CDE: 10.77  . Skin cancer excision      multiple    Social History   Social History  . Marital Status: Married    Spouse Name: N/A  . Number of Children: N/A  . Years of Education: N/A   Occupational History  . Retired    Social History Main Topics  . Smoking status: Never Smoker   . Smokeless tobacco: Never Used  . Alcohol Use: No  . Drug Use: No  . Sexual Activity: Not on file   Other Topics Concern  . Not on file   Social History Narrative     Filed Vitals:   09/16/15 1013  BP: 154/83  Pulse: 64  Height: 5\' 4"  (1.626 m)  Weight: 114 lb (51.71 kg)  SpO2: 100%    PHYSICAL EXAM General: NAD HEENT: Normal. Neck: No JVD, no thyromegaly. Lungs: Clear to auscultation bilaterally with normal respiratory effort. CV: Nondisplaced PMI.  Regular rate and rhythm, normal S1/S2, no T1/X7, soft systolic murmur along left sternal border, 2/6. Trivial left leg edema.   Abdomen: Soft, nontender, no distention.  Neurologic: Alert and oriented.  Psych: Normal affect. Skin: Normal. Musculoskeletal: No gross deformities. Extremities: No clubbing or cyanosis.   ECG: Most recent ECG reviewed.      ASSESSMENT AND PLAN:  Chronic diastolic heart failure  Euvolemic and stable. She previously developed leg swelling when diuretics were stopped during hospitalization, with improvement since diuretic resumption. No change to present diuretic regimen.   Paroxysmal Atrial fibrillation  Currently in a regular rhythm. Given evidence from Active-W trial, I previously recommended discontinuing  ASA with Plavix for stroke prophylaxis and prescribed Eliquis but she declined and prefers ASA and Plavix.  CAD (coronary artery disease)  Quiescent on current medication regimen. As per pt preference, continue ASA and Plavix.   Carotid artery disease  Mild bilateral disease in June 2015. May be repeated in 1.5 years.   Valvular heart disease  Symptomatically stable on present diuretic regimen.   Essential Hypertension  Mildly elevated. We spoke about mildly elevated BP but daughter prefers to not change meds which is reasonable.  Hyperlipidemia Well controlled on Lipitor 40 mg. I reviewed most recent results from 01/2015. No changes.  Dispo: f/u 6 months.  Kate Sable, M.D., F.A.C.C.

## 2015-09-22 ENCOUNTER — Ambulatory Visit: Payer: Medicare Other | Admitting: Nurse Practitioner

## 2015-10-06 ENCOUNTER — Telehealth: Payer: Self-pay | Admitting: Nurse Practitioner

## 2015-10-08 ENCOUNTER — Other Ambulatory Visit: Payer: Self-pay | Admitting: Family Medicine

## 2015-10-09 ENCOUNTER — Other Ambulatory Visit: Payer: Self-pay | Admitting: Family Medicine

## 2015-10-16 ENCOUNTER — Other Ambulatory Visit: Payer: Self-pay | Admitting: *Deleted

## 2015-10-16 DIAGNOSIS — E785 Hyperlipidemia, unspecified: Secondary | ICD-10-CM

## 2015-10-16 MED ORDER — FENOFIBRATE MICRONIZED 134 MG PO CAPS: 134.0000 mg | ORAL_CAPSULE | Freq: Every day | ORAL | Status: DC

## 2015-10-26 NOTE — Telephone Encounter (Signed)
Never got form, pt aware, told her to contact company if she still wants

## 2015-11-12 ENCOUNTER — Other Ambulatory Visit: Payer: Self-pay | Admitting: Nurse Practitioner

## 2015-12-01 ENCOUNTER — Telehealth: Payer: Self-pay | Admitting: Nurse Practitioner

## 2015-12-01 DIAGNOSIS — I10 Essential (primary) hypertension: Secondary | ICD-10-CM

## 2015-12-01 DIAGNOSIS — E118 Type 2 diabetes mellitus with unspecified complications: Secondary | ICD-10-CM

## 2015-12-01 DIAGNOSIS — E221 Hyperprolactinemia: Secondary | ICD-10-CM

## 2015-12-01 NOTE — Telephone Encounter (Signed)
Labs ordered.

## 2015-12-01 NOTE — Telephone Encounter (Signed)
Patient wants order for labs put in so she can come in before she sees MMM on Friday and call her daughter when complete.

## 2015-12-02 NOTE — Telephone Encounter (Signed)
Patient notified that lab orders placed in computer.

## 2015-12-04 ENCOUNTER — Encounter: Payer: Self-pay | Admitting: Nurse Practitioner

## 2015-12-04 ENCOUNTER — Ambulatory Visit (INDEPENDENT_AMBULATORY_CARE_PROVIDER_SITE_OTHER): Payer: Medicare Other | Admitting: Nurse Practitioner

## 2015-12-04 VITALS — BP 131/61 | HR 73 | Temp 97.0°F | Ht 64.0 in | Wt 117.0 lb

## 2015-12-04 DIAGNOSIS — I6330 Cerebral infarction due to thrombosis of unspecified cerebral artery: Secondary | ICD-10-CM

## 2015-12-04 DIAGNOSIS — N184 Chronic kidney disease, stage 4 (severe): Secondary | ICD-10-CM | POA: Diagnosis not present

## 2015-12-04 DIAGNOSIS — E118 Type 2 diabetes mellitus with unspecified complications: Secondary | ICD-10-CM | POA: Diagnosis not present

## 2015-12-04 DIAGNOSIS — R609 Edema, unspecified: Secondary | ICD-10-CM | POA: Diagnosis not present

## 2015-12-04 DIAGNOSIS — I480 Paroxysmal atrial fibrillation: Secondary | ICD-10-CM | POA: Diagnosis not present

## 2015-12-04 DIAGNOSIS — I10 Essential (primary) hypertension: Secondary | ICD-10-CM

## 2015-12-04 DIAGNOSIS — E785 Hyperlipidemia, unspecified: Secondary | ICD-10-CM | POA: Diagnosis not present

## 2015-12-04 DIAGNOSIS — E876 Hypokalemia: Secondary | ICD-10-CM

## 2015-12-04 DIAGNOSIS — I5032 Chronic diastolic (congestive) heart failure: Secondary | ICD-10-CM

## 2015-12-04 LAB — POCT GLYCOSYLATED HEMOGLOBIN (HGB A1C): Hemoglobin A1C: 7.7

## 2015-12-04 MED ORDER — LANTUS SOLOSTAR 100 UNIT/ML ~~LOC~~ SOPN: 10.0000 [IU] | PEN_INJECTOR | Freq: Every day | SUBCUTANEOUS | Status: DC

## 2015-12-04 MED ORDER — GLIMEPIRIDE 4 MG PO TABS: 2.0000 mg | ORAL_TABLET | Freq: Every day | ORAL | Status: DC

## 2015-12-04 MED ORDER — ATORVASTATIN CALCIUM 40 MG PO TABS: ORAL_TABLET | ORAL | Status: AC

## 2015-12-04 MED ORDER — TORSEMIDE 20 MG PO TABS: 20.0000 mg | ORAL_TABLET | Freq: Two times a day (BID) | ORAL | Status: DC | PRN

## 2015-12-04 MED ORDER — POTASSIUM CHLORIDE ER 10 MEQ PO TBCR: 10.0000 meq | EXTENDED_RELEASE_TABLET | Freq: Two times a day (BID) | ORAL | Status: DC

## 2015-12-04 MED ORDER — CLOPIDOGREL BISULFATE 75 MG PO TABS: ORAL_TABLET | ORAL | Status: AC

## 2015-12-04 NOTE — Progress Notes (Signed)
Subjective:    Patient ID: Robin [name], female    DOB: 1928/07/28, 81 y.o.   MRN: [id number]  Patient here today for follow up of chronic medical problems. No acute complaint.   Hyperlipidemia This is a chronic problem. The current episode started more than 1 year ago. The problem is uncontrolled. Recent lipid tests were reviewed and are variable. Pertinent negatives include no chest pain. Current antihyperlipidemic treatment includes statins. There are no compliance problems.  Risk factors for coronary artery disease include hypertension, dyslipidemia and diabetes mellitus.  Hypertension This is a chronic problem. The current episode started more than 1 year ago. The problem is controlled. Pertinent negatives include no chest pain or palpitations. Risk factors for coronary artery disease include diabetes mellitus, dyslipidemia and post-menopausal state. There are no compliance problems.   Diabetes She presents for her follow-up diabetic visit. She has type 2 diabetes mellitus. There are no hypoglycemic associated symptoms. Associated symptoms include fatigue and weakness. Pertinent negatives for diabetes include no chest pain and no visual change. There are no hypoglycemic complications. There are no diabetic complications. Risk factors for coronary artery disease include hypertension, dyslipidemia, diabetes mellitus and post-menopausal. Current diabetic treatment includes oral agent (monotherapy) (Was in hospital in september and they stopped her lantus- was on 20u daily.). Her weight is stable. She is following a diabetic diet. When asked about meal planning, she reported none. She has not had a previous visit with a dietitian. She rarely participates in exercise. Her breakfast blood glucose is taken between 9-10 am. Her breakfast blood glucose range is generally 130-140 mg/dl. Her overall blood glucose range is 130-140 mg/dl. An ACE inhibitor/angiotensin II receptor blocker is being taken. She sees  a podiatrist.Eye exam is current.  Hypokalemia Potassium supplements daily- No c/o lower ext cramps CAD No c/o chest pain or cramping of lower ext. Has not needed to take nitorglycerin CHF No recent swelling or SOB- has not needed to take zaroxolyn in awhile Atrial fib under control- does not c/o palpitations or heart racing. Hx of stroke No residual effects- doing well. Peripheral edema Takes demadex daily- keeps swelling to a minimum    Review of Systems  Constitutional: Positive for fatigue.  Respiratory: Negative.   Cardiovascular: Negative for chest pain and palpitations.  Genitourinary: Negative.   Neurological: Positive for weakness.  Psychiatric/Behavioral: Negative.   All other systems reviewed and are negative.       Objective:   Physical Exam  Constitutional: She is oriented to person, place, and time. She appears well-developed and well-nourished.  HENT:  Head: Normocephalic.  Eyes: Conjunctivae and EOM are normal. Pupils are equal, round, and reactive to light.  Cardiovascular: Normal heart sounds and intact distal pulses.  Bradycardia present.   Pulmonary/Chest: Effort normal.  Abdominal: Soft.  Musculoskeletal: Normal range of motion.  Neurological: She is alert and oriented to person, place, and time. She has normal reflexes.  Skin: Skin is warm.  Multiple mold, varying size all over her body.   Psychiatric: She has a normal mood and affect. Her behavior is normal. Judgment and thought content normal.   Results for orders placed or performed in visit on 12/04/15  POCT glycosylated hemoglobin (Hb A1C)  Result Value Ref Range   Hemoglobin A1C 7.7     BP 131/61 mmHg  Pulse 73  Temp(Src) 97 F (36.1 C) (Oral)  Ht '5\' 4"'  (1.626 m)  Wt 117 lb (53.071 kg)  BMI 20.07 kg/m2  Assessment & Plan:  1. Type 2 diabetes mellitus with complication, without long-term current use of insulin (HCC) Need to go back on lantus but will do 10u daily - POCT  glycosylated hemoglobin (Hb A1C) - POCT glycosylated hemoglobin (Hb A1C) - CMP14+EGFR - Lipid panel - glimepiride (AMARYL) 4 MG tablet; Take 0.5 tablets (2 mg total) by mouth daily.  Dispense: 30 tablet; Refill: 5 - LANTUS SOLOSTAR 100 UNIT/ML Solostar Pen; Inject 10 Units into the skin daily at 10 pm.  Dispense: 15 mL; Refill: 5  2. Essential hypertension Do not add salt to diet - CMP14+EGFR - Lipid panel - POCT glycosylated hemoglobin (Hb A1C) - CMP14+EGFR - Lipid panel  3. Chronic diastolic heart failure ([place]) Keep follow up with cardiologist Continue fluid pill daily  4. Paroxysmal atrial fibrillation (HCC) - clopidogrel (PLAVIX) 75 MG tablet; TAKE ONE (1) TABLET EACH DAY  Dispense: 30 tablet; Refill: 5  5. Cerebral thrombosis with cerebral infarction ([place])  6. CKD (chronic kidney disease), stage IV (HCC) Need to get diabetes back under control due to poor kidney function  7. Hypokalemia - potassium chloride (K-DUR) 10 MEQ tablet; Take 1 tablet (10 mEq total) by mouth 2 (two) times daily.  Dispense: 30 tablet; Refill: 5  8. Hyperlipemia Low fat diet - atorvastatin (LIPITOR) 40 MG tablet; TAKE ONE (1) TABLET EACH DAY  Dispense: 30 tablet; Refill: 2  9. Peripheral edema - torsemide (DEMADEX) 20 MG tablet; Take 1 tablet (20 mg total) by mouth 2 (two) times daily as needed. Swelling; Some days, daughter gives her 2 tablets every other day  Dispense: 30 tablet; Refill: 5    Labs pending Health maintenance reviewed Diet and exercise encouraged Continue all meds Follow up  In 3 months   [place], FNP

## 2015-12-04 NOTE — Patient Instructions (Signed)

## 2015-12-05 LAB — LIPID PANEL
CHOL/HDL RATIO: 6.6 ratio — AB (ref 0.0–4.4)
Cholesterol, Total: 159 mg/dL (ref 100–199)
HDL: 24 mg/dL — ABNORMAL LOW (ref >39)
LDL CALC: 87 mg/dL (ref 0–99)
Triglycerides: 241 mg/dL — ABNORMAL HIGH (ref 0–149)
VLDL CHOLESTEROL CAL: 48 mg/dL — AB (ref 5–40)

## 2015-12-05 LAB — CMP14+EGFR
ALBUMIN: 3.6 g/dL (ref 3.5–4.7)
ALT: 14 IU/L (ref 0–32)
AST: 23 IU/L (ref 0–40)
Albumin/Globulin Ratio: 1.2 (ref 1.1–2.5)
Alkaline Phosphatase: 72 IU/L (ref 39–117)
BILIRUBIN TOTAL: 0.3 mg/dL (ref 0.0–1.2)
BUN / CREAT RATIO: 25 (ref 11–26)
BUN: 66 mg/dL — AB (ref 8–27)
CO2: 17 mmol/L — AB (ref 18–29)
CREATININE: 2.63 mg/dL — AB (ref 0.57–1.00)
Calcium: 9.6 mg/dL (ref 8.7–10.3)
Chloride: 106 mmol/L (ref 96–106)
GFR calc non Af Amer: 16 mL/min/{1.73_m2} — ABNORMAL LOW (ref >59)
GFR, EST AFRICAN AMERICAN: 18 mL/min/{1.73_m2} — AB (ref >59)
GLOBULIN, TOTAL: 3 g/dL (ref 1.5–4.5)
GLUCOSE: 154 mg/dL — AB (ref 65–99)
Potassium: 5.5 mmol/L — ABNORMAL HIGH (ref 3.5–5.2)
SODIUM: 145 mmol/L — AB (ref 134–144)
TOTAL PROTEIN: 6.6 g/dL (ref 6.0–8.5)

## 2015-12-24 ENCOUNTER — Inpatient Hospital Stay (HOSPITAL_COMMUNITY)
Admission: EM | Admit: 2015-12-24 | Discharge: 2015-12-29 | DRG: 682 | Disposition: A | Discharge status: Home / Self Care | Payer: Medicare Other | Attending: Internal Medicine | Admitting: Internal Medicine

## 2015-12-24 ENCOUNTER — Ambulatory Visit (INDEPENDENT_AMBULATORY_CARE_PROVIDER_SITE_OTHER): Payer: Medicare Other | Admitting: Nurse Practitioner

## 2015-12-24 ENCOUNTER — Encounter (HOSPITAL_COMMUNITY): Payer: Self-pay | Admitting: Emergency Medicine

## 2015-12-24 ENCOUNTER — Emergency Department (HOSPITAL_COMMUNITY): Payer: Medicare Other

## 2015-12-24 VITALS — BP 119/67 | HR 112 | Temp 100.4°F | Ht 64.0 in | Wt 118.0 lb

## 2015-12-24 DIAGNOSIS — R197 Diarrhea, unspecified: Secondary | ICD-10-CM | POA: Diagnosis present

## 2015-12-24 DIAGNOSIS — A09 Infectious gastroenteritis and colitis, unspecified: Secondary | ICD-10-CM | POA: Diagnosis present

## 2015-12-24 DIAGNOSIS — I480 Paroxysmal atrial fibrillation: Secondary | ICD-10-CM | POA: Diagnosis not present

## 2015-12-24 DIAGNOSIS — Z6821 Body mass index (BMI) 21.0-21.9, adult: Secondary | ICD-10-CM | POA: Diagnosis not present

## 2015-12-24 DIAGNOSIS — R5381 Other malaise: Secondary | ICD-10-CM | POA: Diagnosis present

## 2015-12-24 DIAGNOSIS — N189 Chronic kidney disease, unspecified: Secondary | ICD-10-CM | POA: Diagnosis not present

## 2015-12-24 DIAGNOSIS — M10072 Idiopathic gout, left ankle and foot: Secondary | ICD-10-CM

## 2015-12-24 DIAGNOSIS — K529 Noninfective gastroenteritis and colitis, unspecified: Secondary | ICD-10-CM

## 2015-12-24 DIAGNOSIS — Z7902 Long term (current) use of antithrombotics/antiplatelets: Secondary | ICD-10-CM

## 2015-12-24 DIAGNOSIS — R109 Unspecified abdominal pain: Secondary | ICD-10-CM

## 2015-12-24 DIAGNOSIS — I130 Hypertensive heart and chronic kidney disease with heart failure and stage 1 through stage 4 chronic kidney disease, or unspecified chronic kidney disease: Secondary | ICD-10-CM | POA: Diagnosis present

## 2015-12-24 DIAGNOSIS — Z955 Presence of coronary angioplasty implant and graft: Secondary | ICD-10-CM

## 2015-12-24 DIAGNOSIS — N179 Acute kidney failure, unspecified: Principal | ICD-10-CM | POA: Diagnosis present

## 2015-12-24 DIAGNOSIS — N184 Chronic kidney disease, stage 4 (severe): Secondary | ICD-10-CM | POA: Diagnosis present

## 2015-12-24 DIAGNOSIS — A047 Enterocolitis due to Clostridium difficile: Secondary | ICD-10-CM | POA: Diagnosis present

## 2015-12-24 DIAGNOSIS — E43 Unspecified severe protein-calorie malnutrition: Secondary | ICD-10-CM | POA: Diagnosis present

## 2015-12-24 DIAGNOSIS — E1122 Type 2 diabetes mellitus with diabetic chronic kidney disease: Secondary | ICD-10-CM | POA: Diagnosis present

## 2015-12-24 DIAGNOSIS — Z79899 Other long term (current) drug therapy: Secondary | ICD-10-CM

## 2015-12-24 DIAGNOSIS — D631 Anemia in chronic kidney disease: Secondary | ICD-10-CM | POA: Diagnosis not present

## 2015-12-24 DIAGNOSIS — Z90710 Acquired absence of both cervix and uterus: Secondary | ICD-10-CM

## 2015-12-24 DIAGNOSIS — E860 Dehydration: Secondary | ICD-10-CM | POA: Diagnosis present

## 2015-12-24 DIAGNOSIS — E785 Hyperlipidemia, unspecified: Secondary | ICD-10-CM | POA: Diagnosis present

## 2015-12-24 DIAGNOSIS — Z7982 Long term (current) use of aspirin: Secondary | ICD-10-CM | POA: Diagnosis not present

## 2015-12-24 DIAGNOSIS — R509 Fever, unspecified: Secondary | ICD-10-CM | POA: Diagnosis not present

## 2015-12-24 DIAGNOSIS — Z8249 Family history of ischemic heart disease and other diseases of the circulatory system: Secondary | ICD-10-CM

## 2015-12-24 DIAGNOSIS — A419 Sepsis, unspecified organism: Secondary | ICD-10-CM | POA: Diagnosis present

## 2015-12-24 DIAGNOSIS — M7989 Other specified soft tissue disorders: Secondary | ICD-10-CM | POA: Diagnosis not present

## 2015-12-24 DIAGNOSIS — Z85828 Personal history of other malignant neoplasm of skin: Secondary | ICD-10-CM

## 2015-12-24 DIAGNOSIS — I252 Old myocardial infarction: Secondary | ICD-10-CM

## 2015-12-24 DIAGNOSIS — E118 Type 2 diabetes mellitus with unspecified complications: Secondary | ICD-10-CM | POA: Diagnosis present

## 2015-12-24 DIAGNOSIS — E875 Hyperkalemia: Secondary | ICD-10-CM | POA: Diagnosis present

## 2015-12-24 DIAGNOSIS — M1009 Idiopathic gout, multiple sites: Secondary | ICD-10-CM | POA: Diagnosis not present

## 2015-12-24 DIAGNOSIS — Z794 Long term (current) use of insulin: Secondary | ICD-10-CM

## 2015-12-24 DIAGNOSIS — R1084 Generalized abdominal pain: Secondary | ICD-10-CM | POA: Diagnosis not present

## 2015-12-24 DIAGNOSIS — R140 Abdominal distension (gaseous): Secondary | ICD-10-CM | POA: Diagnosis not present

## 2015-12-24 DIAGNOSIS — I2510 Atherosclerotic heart disease of native coronary artery without angina pectoris: Secondary | ICD-10-CM

## 2015-12-24 DIAGNOSIS — I5032 Chronic diastolic (congestive) heart failure: Secondary | ICD-10-CM | POA: Diagnosis present

## 2015-12-24 DIAGNOSIS — I272 Other secondary pulmonary hypertension: Secondary | ICD-10-CM | POA: Diagnosis present

## 2015-12-24 DIAGNOSIS — R609 Edema, unspecified: Secondary | ICD-10-CM | POA: Diagnosis not present

## 2015-12-24 DIAGNOSIS — M109 Gout, unspecified: Secondary | ICD-10-CM | POA: Diagnosis present

## 2015-12-24 DIAGNOSIS — Z029 Encounter for administrative examinations, unspecified: Secondary | ICD-10-CM

## 2015-12-24 LAB — COMPREHENSIVE METABOLIC PANEL
ALK PHOS: 69 U/L (ref 38–126)
ALT: 18 U/L (ref 14–54)
AST: 42 U/L — ABNORMAL HIGH (ref 15–41)
Albumin: 2.7 g/dL — ABNORMAL LOW (ref 3.5–5.0)
Anion gap: 15 (ref 5–15)
BILIRUBIN TOTAL: 0.9 mg/dL (ref 0.3–1.2)
BUN: 58 mg/dL — ABNORMAL HIGH (ref 6–20)
CALCIUM: 8.9 mg/dL (ref 8.9–10.3)
CHLORIDE: 103 mmol/L (ref 101–111)
CO2: 21 mmol/L — AB (ref 22–32)
CREATININE: 3.1 mg/dL — AB (ref 0.44–1.00)
GFR calc Af Amer: 15 mL/min — ABNORMAL LOW (ref >60)
GFR calc non Af Amer: 13 mL/min — ABNORMAL LOW (ref >60)
Glucose, Bld: 159 mg/dL — ABNORMAL HIGH (ref 65–99)
Potassium: 4.2 mmol/L (ref 3.5–5.1)
Sodium: 139 mmol/L (ref 135–145)
Total Protein: 6.7 g/dL (ref 6.5–8.1)

## 2015-12-24 LAB — CBC WITH DIFFERENTIAL/PLATELET
Basophils Absolute: 0.1 10*3/uL (ref 0.0–0.1)
Basophils Relative: 0 %
EOS PCT: 1 %
Eosinophils Absolute: 0.1 10*3/uL (ref 0.0–0.7)
HCT: 29.5 % — ABNORMAL LOW (ref 36.0–46.0)
Hemoglobin: 9.2 g/dL — ABNORMAL LOW (ref 12.0–15.0)
LYMPHS ABS: 1.9 10*3/uL (ref 0.7–4.0)
Lymphocytes Relative: 8 %
MCH: 27.3 pg (ref 26.0–34.0)
MCHC: 31.2 g/dL (ref 30.0–36.0)
MCV: 87.5 fL (ref 78.0–100.0)
MONOS PCT: 7 %
Monocytes Absolute: 1.6 10*3/uL — ABNORMAL HIGH (ref 0.1–1.0)
NEUTROS PCT: 84 %
Neutro Abs: 20.3 10*3/uL — ABNORMAL HIGH (ref 1.7–7.7)
PLATELETS: 385 10*3/uL (ref 150–400)
RBC: 3.37 MIL/uL — ABNORMAL LOW (ref 3.87–5.11)
RDW: 17.2 % — ABNORMAL HIGH (ref 11.5–15.5)
WBC MORPHOLOGY: INCREASED
WBC: 24 10*3/uL — ABNORMAL HIGH (ref 4.0–10.5)

## 2015-12-24 LAB — GLUCOSE, CAPILLARY: Glucose-Capillary: 189 mg/dL — ABNORMAL HIGH (ref 65–99)

## 2015-12-24 LAB — I-STAT CG4 LACTIC ACID, ED: Lactic Acid, Venous: 1.45 mmol/L (ref 0.5–2.0)

## 2015-12-24 LAB — LIPASE, BLOOD: Lipase: 35 U/L (ref 11–51)

## 2015-12-24 MED ORDER — OXYCODONE HCL 5 MG PO TABS
5.0000 mg | ORAL_TABLET | ORAL | Status: DC | PRN
  Administered 2015-12-28 (×4): 5 mg via ORAL
  Filled 2015-12-24 (×4): qty 1

## 2015-12-24 MED ORDER — ACETAMINOPHEN 325 MG PO TABS: 650.0000 mg | ORAL_TABLET | Freq: Four times a day (QID) | ORAL | Status: DC | PRN

## 2015-12-24 MED ORDER — SODIUM CHLORIDE 0.9 % IV BOLUS (SEPSIS)
1000.0000 mL | Freq: Once | INTRAVENOUS | Status: AC
  Administered 2015-12-24: 1000 mL via INTRAVENOUS

## 2015-12-24 MED ORDER — INSULIN ASPART 100 UNIT/ML ~~LOC~~ SOLN
0.0000 [IU] | Freq: Three times a day (TID) | SUBCUTANEOUS | Status: DC
  Administered 2015-12-25: 1 [IU] via SUBCUTANEOUS
  Administered 2015-12-25 – 2015-12-29 (×7): 2 [IU] via SUBCUTANEOUS
  Administered 2015-12-29: 1 [IU] via SUBCUTANEOUS

## 2015-12-24 MED ORDER — MORPHINE SULFATE (PF) 2 MG/ML IV SOLN
1.0000 mg | INTRAVENOUS | Status: DC | PRN
  Administered 2015-12-28 (×2): 1 mg via INTRAVENOUS
  Filled 2015-12-24 (×2): qty 1

## 2015-12-24 MED ORDER — ASPIRIN EC 81 MG PO TBEC
81.0000 mg | DELAYED_RELEASE_TABLET | Freq: Every day | ORAL | Status: DC
  Administered 2015-12-25 – 2015-12-29 (×5): 81 mg via ORAL
  Filled 2015-12-24 (×5): qty 1

## 2015-12-24 MED ORDER — METRONIDAZOLE IN NACL 5-0.79 MG/ML-% IV SOLN
500.0000 mg | Freq: Once | INTRAVENOUS | Status: AC
  Administered 2015-12-24: 500 mg via INTRAVENOUS
  Filled 2015-12-24: qty 100

## 2015-12-24 MED ORDER — METRONIDAZOLE IN NACL 5-0.79 MG/ML-% IV SOLN
500.0000 mg | Freq: Three times a day (TID) | INTRAVENOUS | Status: DC
  Administered 2015-12-25 – 2015-12-29 (×13): 500 mg via INTRAVENOUS
  Filled 2015-12-24 (×13): qty 100

## 2015-12-24 MED ORDER — ACETAMINOPHEN 650 MG RE SUPP: 650.0000 mg | Freq: Four times a day (QID) | RECTAL | Status: DC | PRN

## 2015-12-24 MED ORDER — HEPARIN SODIUM (PORCINE) 5000 UNIT/ML IJ SOLN
5000.0000 [IU] | Freq: Three times a day (TID) | INTRAMUSCULAR | Status: DC
  Administered 2015-12-24 – 2015-12-29 (×14): 5000 [IU] via SUBCUTANEOUS
  Filled 2015-12-24 (×14): qty 1

## 2015-12-24 MED ORDER — ATORVASTATIN CALCIUM 40 MG PO TABS
40.0000 mg | ORAL_TABLET | Freq: Every day | ORAL | Status: DC
  Administered 2015-12-24 – 2015-12-28 (×5): 40 mg via ORAL
  Filled 2015-12-24 (×3): qty 1
  Filled 2015-12-24: qty 2
  Filled 2015-12-24: qty 1

## 2015-12-24 MED ORDER — CIPROFLOXACIN IN D5W 400 MG/200ML IV SOLN
400.0000 mg | Freq: Once | INTRAVENOUS | Status: AC
  Administered 2015-12-24: 400 mg via INTRAVENOUS
  Filled 2015-12-24: qty 200

## 2015-12-24 MED ORDER — ONDANSETRON HCL 4 MG PO TABS
4.0000 mg | ORAL_TABLET | Freq: Four times a day (QID) | ORAL | Status: DC | PRN
  Administered 2015-12-25 – 2015-12-28 (×2): 4 mg via ORAL
  Filled 2015-12-24 (×2): qty 1

## 2015-12-24 MED ORDER — CLOPIDOGREL BISULFATE 75 MG PO TABS
75.0000 mg | ORAL_TABLET | Freq: Every day | ORAL | Status: DC
  Administered 2015-12-25 – 2015-12-29 (×5): 75 mg via ORAL
  Filled 2015-12-24 (×5): qty 1

## 2015-12-24 MED ORDER — SODIUM CHLORIDE 0.9 % IV SOLN
INTRAVENOUS | Status: AC
  Administered 2015-12-24 – 2015-12-25 (×2): via INTRAVENOUS

## 2015-12-24 MED ORDER — ONDANSETRON HCL 4 MG/2ML IJ SOLN
4.0000 mg | Freq: Four times a day (QID) | INTRAMUSCULAR | Status: DC | PRN
  Administered 2015-12-25: 4 mg via INTRAVENOUS
  Filled 2015-12-24 (×2): qty 2

## 2015-12-24 MED ORDER — SODIUM CHLORIDE 0.9 % IV BOLUS (SEPSIS)
500.0000 mL | Freq: Once | INTRAVENOUS | Status: AC
  Administered 2015-12-24: 500 mL via INTRAVENOUS

## 2015-12-24 NOTE — ED Notes (Signed)
Pt states that she has been running a fever for unknown amt of time with abdominal pain and diarrhea starting 3 days ago.  Sent from [place] for eval.

## 2015-12-24 NOTE — Progress Notes (Signed)
   Subjective:    Patient ID: Robin [name], female    DOB: 1928-09-21, 82 y.o.   MRN: [id number]  HPI Patient in c/o diarrhea- has been to the bathroom about 6 times today. SHe has no appetite- no vomiting- says that she has a pain in lower abdomen rates pain 8/10. Has had fever all day.    Review of Systems  Constitutional: Negative.   HENT: Negative.   Respiratory: Negative.   Cardiovascular: Negative.   Gastrointestinal: Positive for abdominal pain and diarrhea.  Genitourinary: Positive for pelvic pain. Negative for dysuria, urgency and frequency.  Musculoskeletal: Negative.   Neurological: Negative.   Psychiatric/Behavioral: Negative.   All other systems reviewed and are negative.      Objective:   Physical Exam  Constitutional: She appears well-developed and well-nourished.  Cardiovascular: Normal rate, regular rhythm and normal heart sounds.   Pulmonary/Chest: Effort normal and breath sounds normal.  Abdominal: Soft. Bowel sounds are normal. There is tenderness (diffuse throughout abdomen- worse left lower quadrant).  Skin: Skin is warm.  Psychiatric: She has a normal mood and affect. Her behavior is normal. Judgment and thought content normal.    BP 119/67 mmHg  Pulse 112  Temp(Src) 100.4 F (38 C) (Oral)  Ht 5\' 4"  (1.626 m)  Wt 118 lb (53.524 kg)  BMI 20.24 kg/m2       Assessment & Plan:   1. Fever, unspecified fever cause   2. Generalized abdominal pain    To ER for evaluation NPO   Mary-Margaret Hassell Done, FNP

## 2015-12-24 NOTE — ED Provider Notes (Signed)
CSN: [id number]     Arrival date & time 12/24/15  1831 History   First MD Initiated Contact with Patient 12/24/15 1855     Chief Complaint  Patient presents with  . Fever  . Abdominal Pain  . Diarrhea     (Consider location/radiation/quality/duration/timing/severity/associated sxs/prior Treatment) Patient is a 82 y.o. female presenting with diarrhea. The history is provided by the patient (The patient has had diarrhea for a few days with some abdominal distress).  Diarrhea Quality:  Malodorous Severity:  Moderate Onset quality:  Sudden Timing:  Constant Progression:  Unchanged Relieved by:  Nothing Associated symptoms: abdominal pain and fever   Associated symptoms: no headaches     Past Medical History  Diagnosis Date  . Chronic renal insufficiency   . Hypertension   . Hypokalemia   . DM (diabetes mellitus) ([place])   . Myocardial infarction [facility])     NSTEMI/DES LAD, 02/2010  . Shortness of breath   . Cancer ([place])     skin cancer  . Chronic diastolic heart failure (HCC)     EF 60-65%, echo, 02/2012  . Valvular heart disease     Moderate MR; severe TR, echo, 02/2012  . Pulmonary hypertension (HCC)     Severe (RVSP 63 mmHg), echo, 02/2012  . CAD (coronary artery disease)   . Carotid artery disease (HCC)     0000000 RICA; A999333 LICA, [id number]  . HLD (hyperlipidemia)   . Atrial fibrillation ([place])   . Peripheral edema    Past Surgical History  Procedure Laterality Date  . Total abdominal hysterectomy    . Cholecystectomy    . Cardiac catheterization      with stent 2011  . Coronary stent placement  2011  . Cataract extraction w/phaco  08/29/2011    Procedure: CATARACT EXTRACTION PHACO AND INTRAOCULAR LENS PLACEMENT (IOC);  Surgeon: Tonny Branch;  Location: [facility];  Service: Ophthalmology;  Laterality: Right;  CDE: 10.25  . Eye surgery  Sept. 2012    right KPE w/ IOL  . Cataract extraction w/phaco  09/08/2011    Procedure: CATARACT EXTRACTION PHACO AND INTRAOCULAR LENS  PLACEMENT (IOC);  Surgeon: Tonny Branch;  Location: [facility];  Service: Ophthalmology;  Laterality: Left;  CDE: 10.77  . Skin cancer excision      multiple   Family History  Problem Relation Age of Onset  . Coronary artery disease Neg Hx   . Anesthesia problems Neg Hx   . Hypotension Neg Hx   . Malignant hyperthermia Neg Hx   . Pseudochol deficiency Neg Hx   . CAD Mother   . Heart attack Father   . Cancer Brother     colon   Social History  Substance Use Topics  . Smoking status: Never Smoker   . Smokeless tobacco: Never Used  . Alcohol Use: No   OB History    No data available     Review of Systems  Constitutional: Positive for fever. Negative for appetite change and fatigue.  HENT: Negative for congestion, ear discharge and sinus pressure.   Eyes: Negative for discharge.  Respiratory: Negative for cough.   Cardiovascular: Negative for chest pain.  Gastrointestinal: Positive for abdominal pain and diarrhea.  Genitourinary: Negative for frequency and hematuria.  Musculoskeletal: Negative for back pain.  Skin: Negative for rash.  Neurological: Negative for seizures and headaches.  Psychiatric/Behavioral: Negative for hallucinations.      Allergies  Review of patient's allergies indicates no known allergies.  Home Medications  Prior to Admission medications   Medication Sig Start Date End Date Taking? Authorizing Provider  aspirin EC 81 MG tablet Take 1 tablet (81 mg total) by mouth daily. 07/22/14  Yes Herminio Commons, MD  atorvastatin (LIPITOR) 40 MG tablet TAKE ONE (1) TABLET EACH DAY 12/04/15  Yes Mary-Margaret Hassell Done, FNP  clopidogrel (PLAVIX) 75 MG tablet TAKE ONE (1) TABLET EACH DAY 12/04/15  Yes Mary-Margaret Hassell Done, FNP  COLCRYS 0.6 MG tablet Take 0.5 tablets (0.3 mg total) by mouth daily. Patient taking differently: Take 0.3 mg by mouth every other day.  05/18/15  Yes Claretta Fraise, MD  cholecalciferol (VITAMIN D) 1000 UNITS tablet Take 1,000 Units by  mouth daily.    Historical Provider, MD  feeding supplement, GLUCERNA SHAKE, (GLUCERNA SHAKE) LIQD Take 237 mLs by mouth 2 (two) times daily between meals. 08/23/15   Debbe Odea, MD  fenofibrate micronized (LOFIBRA) 134 MG capsule Take 1 capsule (134 mg total) by mouth daily before breakfast. 10/16/15   Mary-Margaret Hassell Done, FNP  glimepiride (AMARYL) 4 MG tablet Take 0.5 tablets (2 mg total) by mouth daily. 12/04/15   Mary-Margaret Hassell Done, FNP  LANTUS SOLOSTAR 100 UNIT/ML Solostar Pen Inject 10 Units into the skin daily at 10 pm. 12/04/15   Mary-Margaret Hassell Done, FNP  lisinopril (PRINIVIL,ZESTRIL) 20 MG tablet TAKE ONE TABLET BY MOUTH TWICE DAILY 08/31/15   Mary-Margaret Hassell Done, FNP  metoCLOPramide (REGLAN) 10 MG tablet Take 5 mg by mouth daily as needed for nausea or vomiting. Reported on 12/04/2015 08/07/15   Historical Provider, MD  nitroGLYCERIN (NITROSTAT) 0.4 MG SL tablet Place 1 tablet (0.4 mg total) under the tongue every 5 (five) minutes x 3 doses as needed. 07/18/14   Herminio Commons, MD  omeprazole (PRILOSEC) 40 MG capsule TAKE ONE (1) CAPSULE EACH DAY 11/12/15   Mary-Margaret Hassell Done, FNP  ondansetron (ZOFRAN) 4 MG tablet Take 1 tablet (4 mg total) by mouth every 8 (eight) hours as needed for nausea or vomiting. 08/23/15   Debbe Odea, MD  potassium chloride (K-DUR) 10 MEQ tablet Take 1 tablet (10 mEq total) by mouth 2 (two) times daily. 12/04/15   Mary-Margaret Hassell Done, FNP  torsemide (DEMADEX) 20 MG tablet Take 1 tablet (20 mg total) by mouth 2 (two) times daily as needed. Swelling; Some days, daughter gives her 2 tablets every other day 12/04/15   Mary-Margaret Hassell Done, FNP  TRADJENTA 5 MG TABS tablet TAKE ONE (1) TABLET EACH DAY 10/09/15   Mary-Margaret Hassell Done, FNP  UNIFINE PENTIPS 32G X 4 MM MISC EVERY DAY 02/23/15   Mary-Margaret Hassell Done, FNP   BP 140/52 mmHg  Pulse 73  Temp(Src) 101.4 F (38.6 C) (Rectal)  Resp 16  Ht 5\' 2"  (1.575 m)  Wt 115 lb (52.164 kg)  BMI 21.03 kg/m2  SpO2  98% Physical Exam  Constitutional: She is oriented to person, place, and time. She appears well-developed.  HENT:  Head: Normocephalic.  Eyes: Conjunctivae and EOM are normal. No scleral icterus.  Neck: Neck supple. No thyromegaly present.  Cardiovascular: Normal rate and regular rhythm.  Exam reveals no gallop and no friction rub.   No murmur heard. Pulmonary/Chest: No stridor. She has no wheezes. She has no rales. She exhibits no tenderness.  Abdominal: She exhibits no distension. There is tenderness. There is no rebound.  Musculoskeletal: Normal range of motion. She exhibits no edema.  Lymphadenopathy:    She has no cervical adenopathy.  Neurological: She is oriented to person, place, and time. She exhibits normal muscle tone. Coordination  normal.  Skin: No rash noted. No erythema.  Psychiatric: She has a normal mood and affect. Her behavior is normal.    ED Course  Procedures (including critical care time) Labs Review Labs Reviewed  CBC WITH DIFFERENTIAL/PLATELET - Abnormal; Notable for the following:    WBC 24.0 (*)    RBC 3.37 (*)    Hemoglobin 9.2 (*)    HCT 29.5 (*)    RDW 17.2 (*)    Neutro Abs 20.3 (*)    Monocytes Absolute 1.6 (*)    All other components within normal limits  COMPREHENSIVE METABOLIC PANEL - Abnormal; Notable for the following:    CO2 21 (*)    Glucose, Bld 159 (*)    BUN 58 (*)    Creatinine, Ser 3.10 (*)    Albumin 2.7 (*)    AST 42 (*)    GFR calc non Af Amer 13 (*)    GFR calc Af Amer 15 (*)    All other components within normal limits  CULTURE, BLOOD (ROUTINE X 2)  CULTURE, BLOOD (ROUTINE X 2)  GASTROINTESTINAL PANEL BY PCR, STOOL (REPLACES STOOL CULTURE)  LIPASE, BLOOD  FECAL LACTOFERRIN  I-STAT CG4 LACTIC ACID, ED    Imaging Review Ct Abdomen Pelvis Wo Contrast  12/24/2015  CLINICAL DATA:  82 year old female with abdominal pain, diarrhea and fever for 3 days. Patient with chronic renal failure. EXAM: CT ABDOMEN AND PELVIS WITHOUT  CONTRAST TECHNIQUE: Multidetector CT imaging of the abdomen and pelvis was performed following the standard protocol without IV contrast. COMPARISON:  02/01/2010 and 10/25/2010 ultrasound. FINDINGS: Please note that parenchymal abnormalities may be missed without intravenous contrast. Lower chest: A trace right pleural effusion is noted. Cardiomegaly noted. Hepatobiliary: A 1.1 x 1.4 cm fat containing lesion within the right liver is unchanged and compatible with a benign fatty lesion. The patient is status post cholecystectomy. There is no evidence of intrahepatic biliary dilatation. Pancreas: Unremarkable Spleen: Unremarkable Adrenals/Urinary Tract: Mild bilateral renal cortical atrophy noted. A probable cysts within the left lower pole identified. The adrenal glands and bladder are unremarkable. Stomach/Bowel: There is circumferential wall thickening of the descending colon compatible with colitis. There is equivocal wall thickening of portions of the transverse colon. There is no evidence of bowel obstruction, pneumatosis, pneumoperitoneum or focal collection/abscess. Vascular/Lymphatic: Heavy atherosclerotic calcifications of the aorta, mesenteric arteries and origins are the renal arteries noted. There is no evidence of aortic aneurysm. No enlarged lymph nodes are identified. Reproductive: Patient is status post hysterectomy. There is no evidence of adnexal mass. No free fluid identified. Other: No acute or suspicious abnormality. Musculoskeletal: No acute or suspicious abnormalities are identified. Moderate degenerative changes of the lumbar spine noted. IMPRESSION: Colitis involving the descending colon with equivocal involvement of the transverse colon. Differential includes infectious, inflammatory or ischemic colitis given heavy atherosclerotic calcification of the mesenteric arteries. No evidence of bowel obstruction, pneumoperitoneum, pneumatosis or abscess. Trace right pleural effusion. Cardiomegaly.  Electronically Signed   By: Margarette Canada M.D.   On: 12/24/2015 20:29   I have personally reviewed and evaluated these images and lab results as part of my medical decision-making.   EKG Interpretation None      MDM   Final diagnoses:  Colitis    Admit for colitis    Milton Ferguson, MD 12/24/15 2110

## 2015-12-24 NOTE — H&P (Signed)
[facility] History and Physical  Robin [name] DSK:[id number] DOB: 07/31/1928 DOA: 12/24/2015   PCP: Chevis Pretty, FNP  Specialists: Followed by Dr. Bronson Ing with cardiology. Also followed by dermatologist in [facility]  Chief Complaint: Abdominal pain, diarrhea for 3 days  HPI: Robin [name] is a 82 y.o. female with a past medical history of for diabetes on insulin, coronary artery disease, gout, paroxysmal atrial fibrillation not on anticoagulation, who was in her usual state of health a few days ago when she started having abdominal pain, diarrhea. She's had innumerable number of bowel movements. Most of this has been brown in color. Some of them have been watery. Denies any blood in the stool. Denies any sick contacts. No recent travel. No recent antibiotic use. She ate out at a restaurant on Sunday. However, her daughter also ate there, but she didn't fall sick. She also has abdominal pain in the lower abdomen, left more than right. It is 8 out of 10 in intensity, achy pain without radiation. Some nausea but no vomiting. Has had fever along with chills. Never had a colonoscopy.  Home Medications: Prior to Admission medications   Medication Sig Start Date End Date Taking? Authorizing Provider  aspirin EC 81 MG tablet Take 1 tablet (81 mg total) by mouth daily. 07/22/14  Yes Herminio Commons, MD  atorvastatin (LIPITOR) 40 MG tablet TAKE ONE (1) TABLET EACH DAY 12/04/15  Yes Mary-Margaret Hassell Done, FNP  cholecalciferol (VITAMIN D) 1000 UNITS tablet Take 1,000 Units by mouth daily.   Yes Historical Provider, MD  clopidogrel (PLAVIX) 75 MG tablet TAKE ONE (1) TABLET EACH DAY 12/04/15  Yes Mary-Margaret Hassell Done, FNP  COLCRYS 0.6 MG tablet Take 0.5 tablets (0.3 mg total) by mouth daily. Patient taking differently: Take 0.3 mg by mouth every other day.  05/18/15  Yes Claretta Fraise, MD  feeding supplement, GLUCERNA SHAKE, (GLUCERNA SHAKE) LIQD Take 237 mLs by mouth 2 (two) times  daily between meals. 08/23/15  Yes Debbe Odea, MD  fenofibrate micronized (LOFIBRA) 134 MG capsule Take 1 capsule (134 mg total) by mouth daily before breakfast. 10/16/15  Yes Mary-Margaret Hassell Done, FNP  glimepiride (AMARYL) 4 MG tablet Take 0.5 tablets (2 mg total) by mouth daily. 12/04/15  Yes Mary-Margaret Hassell Done, FNP  LANTUS SOLOSTAR 100 UNIT/ML Solostar Pen Inject 10 Units into the skin daily at 10 pm. 12/04/15  Yes Mary-Margaret Hassell Done, FNP  lisinopril (PRINIVIL,ZESTRIL) 20 MG tablet TAKE ONE TABLET BY MOUTH TWICE DAILY 08/31/15  Yes Mary-Margaret Hassell Done, FNP  omeprazole (PRILOSEC) 40 MG capsule TAKE ONE (1) CAPSULE EACH DAY 11/12/15  Yes Mary-Margaret Hassell Done, FNP  potassium chloride (K-DUR) 10 MEQ tablet Take 1 tablet (10 mEq total) by mouth 2 (two) times daily. 12/04/15  Yes Mary-Margaret Hassell Done, FNP  torsemide (DEMADEX) 20 MG tablet Take 1 tablet (20 mg total) by mouth 2 (two) times daily as needed. Swelling; Some days, daughter gives her 2 tablets every other day 12/04/15  Yes Mary-Margaret Hassell Done, FNP  TRADJENTA 5 MG TABS tablet TAKE ONE (1) TABLET EACH DAY 10/09/15  Yes Mary-Margaret Hassell Done, FNP  metoCLOPramide (REGLAN) 10 MG tablet Take 5 mg by mouth daily as needed for nausea or vomiting. Reported on 12/04/2015 08/07/15   Historical Provider, MD  nitroGLYCERIN (NITROSTAT) 0.4 MG SL tablet Place 1 tablet (0.4 mg total) under the tongue every 5 (five) minutes x 3 doses as needed. Patient taking differently: Place 0.4 mg under the tongue every 5 (five) minutes x 3 doses as needed for chest pain.  07/18/14   Herminio Commons, MD  ondansetron (ZOFRAN) 4 MG tablet Take 1 tablet (4 mg total) by mouth every 8 (eight) hours as needed for nausea or vomiting. 08/23/15   Debbe Odea, MD  UNIFINE PENTIPS 32G X 4 MM MISC EVERY DAY 02/23/15   Mary-Margaret Hassell Done, FNP    Allergies: No Known Allergies  Past Medical History: Past Medical History  Diagnosis Date  . Chronic renal insufficiency   .  Hypertension   . Hypokalemia   . DM (diabetes mellitus) ([place])   . Myocardial infarction [facility])     NSTEMI/DES LAD, 02/2010  . Shortness of breath   . Cancer ([place])     skin cancer  . Chronic diastolic heart failure (HCC)     EF 60-65%, echo, 02/2012  . Valvular heart disease     Moderate MR; severe TR, echo, 02/2012  . Pulmonary hypertension (HCC)     Severe (RVSP 63 mmHg), echo, 02/2012  . CAD (coronary artery disease)   . Carotid artery disease (HCC)     06-23% RICA; 76% LICA, 01/8314  . HLD (hyperlipidemia)   . Atrial fibrillation ([place])   . Peripheral edema     Past Surgical History  Procedure Laterality Date  . Total abdominal hysterectomy    . Cholecystectomy    . Cardiac catheterization      with stent 2011  . Coronary stent placement  2011  . Cataract extraction w/phaco  08/29/2011    Procedure: CATARACT EXTRACTION PHACO AND INTRAOCULAR LENS PLACEMENT (IOC);  Surgeon: Tonny Branch;  Location: [facility];  Service: Ophthalmology;  Laterality: Right;  CDE: 10.25  . Eye surgery  Sept. 2012    right KPE w/ IOL  . Cataract extraction w/phaco  09/08/2011    Procedure: CATARACT EXTRACTION PHACO AND INTRAOCULAR LENS PLACEMENT (IOC);  Surgeon: Tonny Branch;  Location: [facility];  Service: Ophthalmology;  Laterality: Left;  CDE: 10.77  . Skin cancer excision      multiple    Social History: Lives with her husband. Her daughter lives with her. Denies any history of smoking, alcohol use or illicit drug use. Usually independent with daily activity but sometimes needs a cane or walker. Does not have a living will.  Family History:  Family History  Problem Relation Age of Onset  . Coronary artery disease Neg Hx   . Anesthesia problems Neg Hx   . Hypotension Neg Hx   . Malignant hyperthermia Neg Hx   . Pseudochol deficiency Neg Hx   . CAD Mother   . Heart attack Father   . Cancer Brother     colon     Review of Systems - History obtained from the patient General ROS: positive for  -  fatigue Psychological ROS: negative Ophthalmic ROS: negative ENT ROS: negative Allergy and Immunology ROS: negative Hematological and Lymphatic ROS: negative Endocrine ROS: negative Respiratory ROS: no cough, shortness of breath, or wheezing Cardiovascular ROS: no chest pain or dyspnea on exertion Gastrointestinal ROS: as in hpi Genito-Urinary ROS: no dysuria, trouble voiding, or hematuria Musculoskeletal ROS: negative Neurological ROS: no TIA or stroke symptoms Dermatological ROS: negative  Physical Examination  Filed Vitals:   12/24/15 1916 12/24/15 1933 12/24/15 2002 12/24/15 2030  BP:  140/48 138/48 140/52  Pulse:  73 70 73  Temp: 101.4 F (38.6 C)     TempSrc: Rectal     Resp:  _0 Height:      Weight:  SpO2:  100% 98% 98%    BP 140/52 mmHg  Pulse 73  Temp(Src) 101.4 F (38.6 C) (Rectal)  Resp 16  Ht _0  (1.575 m)  Wt 52.164 kg (115 lb)  BMI 21.03 kg/m2  SpO2 98%  General appearance: alert, cooperative, appears stated age and no distress Head: Normocephalic, without obvious abnormality, atraumatic Eyes: conjunctivae/corneas clear. PERRL, EOM's intact.  Throat: lips, mucosa, and tongue normal; teeth and gums normal Neck: no adenopathy, no carotid bruit, no JVD, supple, symmetrical, trachea midline and thyroid not enlarged, symmetric, no tenderness/mass/nodules Resp: clear to auscultation bilaterally Cardio: regular rate and rhythm, S1, S2 normal, no murmur, click, rub or gallop GI: Mildly distended. Tender in the lower abdomen, left more than right. No rebound, rigidity or guarding. No masses or organomegaly. Bowel sounds are present. Extremities: extremities normal, atraumatic, no cyanosis or edema Pulses: 2+ and symmetric Skin: Skin color, texture, turgor normal. No rashes or lesions Lymph nodes: Cervical, supraclavicular, and axillary nodes normal. Neurologic: Alert and oriented 3. No focal neurological deficits are noted.  Laboratory  Data: Results for orders placed or performed during the hospital encounter of 12/24/15 (from the past 48 hour(s))  CBC with Differential/Platelet     Status: Abnormal   Collection Time: 12/24/15  7:27 PM  Result Value Ref Range   WBC 24.0 (H) 4.0 - 10.5 K/uL    Comment: REPEATED TO VERIFY   RBC 3.37 (L) 3.87 - 5.11 MIL/uL   Hemoglobin 9.2 (L) 12.0 - 15.0 g/dL   HCT 29.5 (L) 36.0 - 46.0 %   MCV 87.5 78.0 - 100.0 fL   MCH 27.3 26.0 - 34.0 pg   MCHC 31.2 30.0 - 36.0 g/dL   RDW 17.2 (H) 11.5 - 15.5 %   Platelets 385 150 - 400 K/uL   Neutrophils Relative % 84 %   Neutro Abs 20.3 (H) 1.7 - 7.7 K/uL   Lymphocytes Relative 8 %   Lymphs Abs 1.9 0.7 - 4.0 K/uL   Monocytes Relative 7 %   Monocytes Absolute 1.6 (H) 0.1 - 1.0 K/uL   Eosinophils Relative 1 %   Eosinophils Absolute 0.1 0.0 - 0.7 K/uL   Basophils Relative 0 %   Basophils Absolute 0.1 0.0 - 0.1 K/uL   WBC Morphology INCREASED BANDS (>20% BANDS)     Comment: ATYPICAL LYMPHOCYTES HYPERSEGMENTED NEUT   Comprehensive metabolic panel     Status: Abnormal   Collection Time: 12/24/15  7:27 PM  Result Value Ref Range   Sodium 139 135 - 145 mmol/L   Potassium 4.2 3.5 - 5.1 mmol/L   Chloride 103 101 - 111 mmol/L   CO2 21 (L) 22 - 32 mmol/L   Glucose, Bld 159 (H) 65 - 99 mg/dL   BUN 58 (H) 6 - 20 mg/dL   Creatinine, Ser 3.10 (H) 0.44 - 1.00 mg/dL   Calcium 8.9 8.9 - 10.3 mg/dL   Total Protein 6.7 6.5 - 8.1 g/dL   Albumin 2.7 (L) 3.5 - 5.0 g/dL   AST 42 (H) 15 - 41 U/L   ALT 18 14 - 54 U/L   Alkaline Phosphatase 69 38 - 126 U/L   Total Bilirubin 0.9 0.3 - 1.2 mg/dL   GFR calc non Af Amer 13 (L) >60 mL/min   GFR calc Af Amer 15 (L) >60 mL/min    Comment: (NOTE) The eGFR has been calculated using the CKD EPI equation. This calculation has not been validated in all clinical situations. eGFR's persistently <60  mL/min signify possible Chronic Kidney Disease.    Anion gap 15 5 - 15  Lipase, blood     Status: None   Collection  Time: 12/24/15  7:27 PM  Result Value Ref Range   Lipase 35 11 - 51 U/L  Blood culture (routine x 2)     Status: None (Preliminary result)   Collection Time: 12/24/15  8:43 PM  Result Value Ref Range   Specimen Description RIGHT ANTECUBITAL    Special Requests BOTTLES DRAWN AEROBIC AND ANAEROBIC 6CC    Culture PENDING    Report Status PENDING   Blood culture (routine x 2)     Status: None (Preliminary result)   Collection Time: 12/24/15  9:05 PM  Result Value Ref Range   Specimen Description RIGHT ANTECUBITAL    Special Requests BOTTLES DRAWN AEROBIC AND ANAEROBIC 6CC    Culture PENDING    Report Status PENDING   I-Stat CG4 Lactic Acid, ED     Status: None   Collection Time: 12/24/15  9:05 PM  Result Value Ref Range   Lactic Acid, Venous 1.45 0.5 - 2.0 mmol/L    Radiology Reports: Ct Abdomen Pelvis Wo Contrast  12/24/2015  CLINICAL DATA:  82 year old female with abdominal pain, diarrhea and fever for 3 days. Patient with chronic renal failure. EXAM: CT ABDOMEN AND PELVIS WITHOUT CONTRAST TECHNIQUE: Multidetector CT imaging of the abdomen and pelvis was performed following the standard protocol without IV contrast. COMPARISON:  02/01/2010 and 10/25/2010 ultrasound. FINDINGS: Please note that parenchymal abnormalities may be missed without intravenous contrast. Lower chest: A trace right pleural effusion is noted. Cardiomegaly noted. Hepatobiliary: A 1.1 x 1.4 cm fat containing lesion within the right liver is unchanged and compatible with a benign fatty lesion. The patient is status post cholecystectomy. There is no evidence of intrahepatic biliary dilatation. Pancreas: Unremarkable Spleen: Unremarkable Adrenals/Urinary Tract: Mild bilateral renal cortical atrophy noted. A probable cysts within the left lower pole identified. The adrenal glands and bladder are unremarkable. Stomach/Bowel: There is circumferential wall thickening of the descending colon compatible with colitis. There is  equivocal wall thickening of portions of the transverse colon. There is no evidence of bowel obstruction, pneumatosis, pneumoperitoneum or focal collection/abscess. Vascular/Lymphatic: Heavy atherosclerotic calcifications of the aorta, mesenteric arteries and origins are the renal arteries noted. There is no evidence of aortic aneurysm. No enlarged lymph nodes are identified. Reproductive: Patient is status post hysterectomy. There is no evidence of adnexal mass. No free fluid identified. Other: No acute or suspicious abnormality. Musculoskeletal: No acute or suspicious abnormalities are identified. Moderate degenerative changes of the lumbar spine noted. IMPRESSION: Colitis involving the descending colon with equivocal involvement of the transverse colon. Differential includes infectious, inflammatory or ischemic colitis given heavy atherosclerotic calcification of the mesenteric arteries. No evidence of bowel obstruction, pneumoperitoneum, pneumatosis or abscess. Trace right pleural effusion. Cardiomegaly. Electronically Signed   By: Margarette Canada M.D.   On: 12/24/2015 20:29     Problem List  Principal Problem:   Colitis Active Problems:   CKD (chronic kidney disease), stage IV (HCC)   Diabetes mellitus type 2 with complications ([place])   Anemia in CKD (chronic kidney disease)   Acute on chronic kidney failure (HCC)   Assessment: This is a 82 year old Caucasian female with past medical history as stated earlier, who presents with diarrhea and abdominal pain. CT scan suggests colitis. Lactic acid level is normal. She is hemodynamically stable. This is most likely an infectious colitis.  Plan: #1 Colitis: Most  likely infectious. WBC is noted to be high with increased bands. Stool studies will be ordered. Patient will be placed on ciprofloxacin and Flagyl. Clear liquid diet for now. Pain management.  #2 acute on chronic renal failure: Creatinine slightly higher than her baseline. This is most likely  due to hypovolemia from diarrhea. She'll be given gentle IV hydration. Repeat renal function tomorrow morning. Monitor urine output.  #3 diabetes mellitus type 2 on insulin: Place on sliding scale coverage. Check HbA1c. Hold her oral hypoglycemic agents. Also hold her Lantus for now.  #4 history of coronary artery disease and chronic diastolic CHF: Stable. Continue antiplatelet agents. Monitor volume status closely. Hold her diuretics at least for today.  #5 history of paroxysmal atrial fibrillation : Currently in sinus rhythm. Patient has refused anticoagulation in the past. Continue aspirin, Plavix.  #6 Anemia of chronic disease: Hemoglobin is at baseline. Continue to monitor.  #7 Mildly elevated AST: Repeat tomorrow morning.   DVT Prophylaxis: Heparin subcutaneously Code Status: Full code. This was discussed with the patient and her daughter. She would not want long-term life support. Family Communication: Discussed with the patient and her daughter  Disposition Plan: Admit to MedSurg   Further management decisions will depend on results of further testing and patient's response to treatment.   [facility]  [facility] Pager [phone]  If 7PM-7AM, please contact night-coverage [email or web address] Password [email or web address]  12/24/2015, 9:29 PM

## 2015-12-25 DIAGNOSIS — N184 Chronic kidney disease, stage 4 (severe): Secondary | ICD-10-CM

## 2015-12-25 LAB — COMPREHENSIVE METABOLIC PANEL
ALBUMIN: 2.1 g/dL — AB (ref 3.5–5.0)
ALT: 15 U/L (ref 14–54)
ANION GAP: 9 (ref 5–15)
AST: 33 U/L (ref 15–41)
Alkaline Phosphatase: 55 U/L (ref 38–126)
BUN: 55 mg/dL — ABNORMAL HIGH (ref 6–20)
CO2: 21 mmol/L — AB (ref 22–32)
Calcium: 8.1 mg/dL — ABNORMAL LOW (ref 8.9–10.3)
Chloride: 109 mmol/L (ref 101–111)
Creatinine, Ser: 2.64 mg/dL — ABNORMAL HIGH (ref 0.44–1.00)
GFR calc non Af Amer: 15 mL/min — ABNORMAL LOW (ref >60)
GFR, EST AFRICAN AMERICAN: 18 mL/min — AB (ref >60)
GLUCOSE: 104 mg/dL — AB (ref 65–99)
POTASSIUM: 4 mmol/L (ref 3.5–5.1)
SODIUM: 139 mmol/L (ref 135–145)
TOTAL PROTEIN: 5.3 g/dL — AB (ref 6.5–8.1)
Total Bilirubin: 0.7 mg/dL (ref 0.3–1.2)

## 2015-12-25 LAB — CBC
HEMATOCRIT: 25 % — AB (ref 36.0–46.0)
HEMOGLOBIN: 7.8 g/dL — AB (ref 12.0–15.0)
MCH: 27.2 pg (ref 26.0–34.0)
MCHC: 31.2 g/dL (ref 30.0–36.0)
MCV: 87.1 fL (ref 78.0–100.0)
Platelets: 324 10*3/uL (ref 150–400)
RBC: 2.87 MIL/uL — AB (ref 3.87–5.11)
RDW: 17 % — ABNORMAL HIGH (ref 11.5–15.5)
WBC: 22 10*3/uL — ABNORMAL HIGH (ref 4.0–10.5)

## 2015-12-25 LAB — GLUCOSE, CAPILLARY
GLUCOSE-CAPILLARY: 160 mg/dL — AB (ref 65–99)
GLUCOSE-CAPILLARY: 156 mg/dL — AB (ref 65–99)
Glucose-Capillary: 148 mg/dL — ABNORMAL HIGH (ref 65–99)
Glucose-Capillary: 155 mg/dL — ABNORMAL HIGH (ref 65–99)

## 2015-12-25 MED ORDER — CIPROFLOXACIN IN D5W 400 MG/200ML IV SOLN
400.0000 mg | INTRAVENOUS | Status: DC
  Administered 2015-12-25 – 2015-12-28 (×4): 400 mg via INTRAVENOUS
  Filled 2015-12-25 (×4): qty 200

## 2015-12-25 NOTE — Evaluation (Signed)
Physical Therapy Evaluation Patient Details Name: Robin [name] MRN: [id number] DOB: 1928-10-31 Today's Date: 12/25/2015   History of Present Illness  82yo wehite female with PMH: DM, CAD, gout, who comes to [facility] after several days of abdominal pain and diarrhea. CT suggests colitis. At baseline, pt walks limited community distances (<102ft) with SPC or LRAD, slowly and steadily. Pt denies any falls history. Pt reports chronic orthostasis at baseline when gupine to sitting, for which she knows to remain seated prior to getting up . Pt lives with daughter and husband at home, independent in all ADL and some assistance with IADL.   Clinical Impression  Pt is very near baseline upon evaluation, daughter present to concur. Last labs available reveal Hb: 7.8, HCT: 25.o, however pt denies dizziness, loss of vision, tachypnea, or other changes with positional changes and/or activity. Pt demonstrating some balance deficits (also baseline) indicative of increased falls risk, however pt declines recommendation for PT follow-up to improve balance. Pt is safe for return to home with intermittent supervision once medically appropriate, and I am recommending RW for all mobility until pt feels stronger. No additional equipment or PT services needed at this time: PT signing off.     Follow Up Recommendations No PT follow up    Equipment Recommendations  None recommended by PT    Recommendations for Other Services       Precautions / Restrictions Precautions Precautions: Fall Restrictions Weight Bearing Restrictions: No      Mobility  Bed Mobility Overal bed mobility: Modified Independent                Transfers Overall transfer level: Modified independent Equipment used: Rolling walker (2 wheeled)                Ambulation/Gait Ambulation/Gait assistance: Supervision Ambulation Distance (Feet): 30 Feet Assistive device: Rolling walker (2 wheeled)       General Gait Details:  alternates amb adn retro amb   Stairs            Wheelchair Mobility    Modified Rankin (Stroke Patients Only)       Balance Overall balance assessment: Modified Independent   Sitting balance-Leahy Scale: Normal       Standing balance-Leahy Scale: Good   Single Leg Stance - Right Leg: 2 Single Leg Stance - Left Leg: 2     Rhomberg - Eyes Opened: 30 Rhomberg - Eyes Closed: 10   High Level Balance Comments: Able to pick up object from floor with supervision.              Pertinent Vitals/Pain Pain Assessment: 0-10 Pain Score: 7  Pain Location: ABD Pain Intervention(s): Limited activity within patient's tolerance;Monitored during session    [place] expects to be discharged to:: Private residence Living Arrangements: Spouse/significant other;Children Available Help at Discharge: Family;Available 24 hours/day Type of Home: House       Home Layout: One level Home Equipment: Marble [place]esterfeld - 2 wheels;Cane - single point;Bedside commode Additional Comments: Daughter works nights, 12hr shifts 2 on 2 off.     Prior Function Level of Independence: Independent with assistive device(s)         Comments: intetmittent use of AD or furtniture for stability inside of home.      Hand Dominance   Dominant Hand: Right    Extremity/Trunk Assessment   Upper Extremity Assessment: Overall WFL for tasks assessed           Lower Extremity Assessment: Overall  WFL for tasks assessed         Communication   Communication: HOH  Cognition Arousal/Alertness: Awake/alert Behavior During Therapy: WFL for tasks assessed/performed Overall Cognitive Status: Within Functional Limits for tasks assessed                      General Comments      Exercises        Assessment/Plan    PT Assessment Patent does not need any further PT services  PT Diagnosis Generalized weakness   PT Problem List    PT Treatment Interventions     PT Goals  (Current goals can be found in the Care Plan section) Acute Rehab PT Goals Patient Stated Goal: return to home. Pt not interested in improving balance or fucntional mobility at this time.  PT Goal Formulation: All assessment and education complete, DC therapy    Frequency     Barriers to discharge        Co-evaluation               End of Session Equipment Utilized During Treatment: Gait belt Activity Tolerance: Patient tolerated treatment well;No increased pain Patient left: in bed;with call bell/phone within reach;with family/visitor present;with bed alarm set Nurse Communication: Mobility status;Other (comment) (up to chair 2hrs daily. )         Time: [id number] PT Time Calculation (min) (ACUTE ONLY): 22 min   Charges:   PT Evaluation $PT Eval Low Complexity: 1 Procedure     PT G Codes:         2:56 PM, Jan 07, 2016 Robin [name], PT, DPT PRN Physical Therapist at [place] License # [id number] (wireless)  [phone] (mobile)

## 2015-12-25 NOTE — Progress Notes (Signed)
ANTIBIOTIC CONSULT NOTE - INITIAL  Pharmacy Consult for Cipro Indication: Intra-abdominal Infection   No Known Allergies  Patient Measurements: Height: 5\' 2"  (157.5 cm) Weight: 115 lb (52.164 kg) IBW/kg (Calculated) : 50.1   Vital Signs: Temp: 98.1 F (36.7 C) (01/20 0649) Temp Source: Oral (01/20 0649) BP: 147/32 mmHg (01/20 0649) Pulse Rate: 76 (01/20 0649) Intake/Output from previous day:   Intake/Output from this shift:    Labs:  Recent Labs  12/24/15 1927 12/25/15 0608  WBC 24.0* 22.0*  HGB 9.2* 7.8*  PLT 385 324  CREATININE 3.10* 2.64*   Estimated Creatinine Clearance: 12.3 mL/min (by C-G formula based on Cr of 2.64). No results for input(s): VANCOTROUGH, VANCOPEAK, VANCORANDOM, GENTTROUGH, GENTPEAK, GENTRANDOM, TOBRATROUGH, TOBRAPEAK, TOBRARND, AMIKACINPEAK, AMIKACINTROU, AMIKACIN in the last 72 hours.   Microbiology: Recent Results (from the past 720 hour(s))  Blood culture (routine x 2)     Status: None (Preliminary result)   Collection Time: 12/24/15  8:43 PM  Result Value Ref Range Status   Specimen Description RIGHT ANTECUBITAL  Final   Special Requests BOTTLES DRAWN AEROBIC AND ANAEROBIC 6CC  Final   Culture NO GROWTH < 12 HOURS  Final   Report Status PENDING  Incomplete  Blood culture (routine x 2)     Status: None (Preliminary result)   Collection Time: 12/24/15  9:05 PM  Result Value Ref Range Status   Specimen Description RIGHT ANTECUBITAL  Final   Special Requests BOTTLES DRAWN AEROBIC AND ANAEROBIC 6CC  Final   Culture PENDING  Incomplete   Report Status PENDING  Incomplete   Medical History: Past Medical History  Diagnosis Date  . Chronic renal insufficiency   . Hypertension   . Hypokalemia   . DM (diabetes mellitus) ([place])   . Myocardial infarction [facility])     NSTEMI/DES LAD, 02/2010  . Shortness of breath   . Cancer ([place])     skin cancer  . Chronic diastolic heart failure (HCC)     EF 60-65%, echo, 02/2012  . Valvular heart disease      Moderate MR; severe TR, echo, 02/2012  . Pulmonary hypertension (HCC)     Severe (RVSP 63 mmHg), echo, 02/2012  . CAD (coronary artery disease)   . Carotid artery disease (HCC)     0000000 RICA; A999333 LICA, [id number]  . HLD (hyperlipidemia)   . Atrial fibrillation ([place])   . Peripheral edema    Medications:  Scheduled:  . aspirin EC  81 mg Oral Daily  . atorvastatin  40 mg Oral q1800  . ciprofloxacin  400 mg Intravenous Q24H  . clopidogrel  75 mg Oral Daily  . heparin  5,000 Units Subcutaneous 3 times per day  . insulin aspart  0-9 Units Subcutaneous TID WC  . metronidazole  500 mg Intravenous Q8H   Assessment: Okay for Protocol, acute on chronic renal dysfunction.  Creatinine a little better this morning.    Goal of Therapy:  Eradicate infection.   Plan:  Cipro 400mg  IV every 24 hours. F/U Micro Follow up culture results  Pricilla Larsson 12/25/2015,8:17 AM

## 2015-12-25 NOTE — Progress Notes (Signed)
OT Screen Note  Patient Details Name: Robin [name] MRN: [id number] DOB: [birth year]   Cancelled Treatment:    Reason Eval/Treat Not Completed: OT screened, no needs identified, will sign off  Ailene Ravel, OTR/L,CBIS  [phone]  12/25/2015, 11:25 AM

## 2015-12-25 NOTE — Care Management Important Message (Signed)
Important Message  Patient Details  Name: Robin [name] MRN: [id number] Date of Birth: [birth year]   Medicare Important Message Given:  Yes    Sherald Barge, RN 12/25/2015, 12:05 PM

## 2015-12-25 NOTE — Progress Notes (Signed)
PROGRESS NOTE  Robin [name] [id number] DOB: November 27, 1928 DOA: 12/24/2015 PCP: Chevis Pretty, FNP  Brief History 82 year old female with a history of diabetes mellitus type 2, coronary artery disease, paroxysmal atrial fibrillation, hypertension, and diastolic CHF presents with 3 day history of abdominal pain and diarrhea. The patient states that she ate at a restaurant on 12/20/2015. Her daughter. At the same restaurant but did not have any illness. The patient denies any recent antibiotics or travel or unusual or undercooked foods. She has never had an endoscopy/colonoscopy. She states that she had between 5-10 loose bowel movements over the past 2-3 days. She denies any hematochezia, melena, vomiting, hematemesis, dysuria, hematuria. In the ED, the patient was found to have WBC 24.0, serum creatinine 3.10. CT of the abdomen and pelvis showed circumferential thickening in the descending colon. She was started on intravenous fluids and antibiotics.  Assessment/Plan: Sepsis -pt with fever 101.4, WBC 24K -Secondary to colitis -Continue intravenous fluids -Follow blood cultures Colitis -infectious vs ischemic -stool pathogen panel by PCR -continue cipro and flagyl -continue IVF -clear liquids for now and advance as tolerated -lactic acid 1.45 -pain control Diabetes mellitus type 2 -12/04/2015 hemoglobin A1c 7.7 -Hold the Lantus -NovoLog sliding scale -Hold Amaryl and tradjenta Paroxysmal atrial fibrillation -CHADSVASc = 5 -pt refuses anticoagulation -continue ASA/plavix -Rate controlled Acute on chronic renal failure (CKD4) -Baseline creatinine 2.4-2.6 -Continue intravenous fluids judiciously -Hold torsemide and lisinopril Coronary artery disease -No anginal symptoms -Continue aspirin and Plavix, statin Chronic diastolic CHF -clinically compensated Anemia CKD/chronic disease -Baseline Hgb 8-9 Hyperlipidemia -restart statin when able to tolerate po  better  Family Communication:   Pt at beside Disposition Plan:   Home when medically stable       Procedures/Studies: Ct Abdomen Pelvis Wo Contrast  12/24/2015  CLINICAL DATA:  82 year old female with abdominal pain, diarrhea and fever for 3 days. Patient with chronic renal failure. EXAM: CT ABDOMEN AND PELVIS WITHOUT CONTRAST TECHNIQUE: Multidetector CT imaging of the abdomen and pelvis was performed following the standard protocol without IV contrast. COMPARISON:  02/01/2010 and 10/25/2010 ultrasound. FINDINGS: Please note that parenchymal abnormalities may be missed without intravenous contrast. Lower chest: A trace right pleural effusion is noted. Cardiomegaly noted. Hepatobiliary: A 1.1 x 1.4 cm fat containing lesion within the right liver is unchanged and compatible with a benign fatty lesion. The patient is status post cholecystectomy. There is no evidence of intrahepatic biliary dilatation. Pancreas: Unremarkable Spleen: Unremarkable Adrenals/Urinary Tract: Mild bilateral renal cortical atrophy noted. A probable cysts within the left lower pole identified. The adrenal glands and bladder are unremarkable. Stomach/Bowel: There is circumferential wall thickening of the descending colon compatible with colitis. There is equivocal wall thickening of portions of the transverse colon. There is no evidence of bowel obstruction, pneumatosis, pneumoperitoneum or focal collection/abscess. Vascular/Lymphatic: Heavy atherosclerotic calcifications of the aorta, mesenteric arteries and origins are the renal arteries noted. There is no evidence of aortic aneurysm. No enlarged lymph nodes are identified. Reproductive: Patient is status post hysterectomy. There is no evidence of adnexal mass. No free fluid identified. Other: No acute or suspicious abnormality. Musculoskeletal: No acute or suspicious abnormalities are identified. Moderate degenerative changes of the lumbar spine noted. IMPRESSION: Colitis involving  the descending colon with equivocal involvement of the transverse colon. Differential includes infectious, inflammatory or ischemic colitis given heavy atherosclerotic calcification of the mesenteric arteries. No evidence of bowel obstruction, pneumoperitoneum, pneumatosis or abscess. Trace right pleural effusion. Cardiomegaly. Electronically  Signed   By: Margarette Canada M.D.   On: 12/24/2015 20:29         Subjective: Patient continues to complain of abdominal pain and loose stools. Denies any fevers, chills, headache, neck pain, chest pain, shortness breath, hematochezia, melena. No dysuria or hematuria.  Objective: Filed Vitals:   12/24/15 2030 12/24/15 2130 12/24/15 2224 12/25/15 0649  BP: 140/52 151/74 164/50 147/32  Pulse: 73 72 79 76  Temp:   98.3 F (36.8 C) 98.1 F (36.7 C)  TempSrc:   Oral Oral  Resp: 16 14 20 20   Height:      Weight:      SpO2: 98% 100% 100% 98%   No intake or output data in the 24 hours ending 12/25/15 1110 Weight change:  Exam:   General:  Pt is alert, follows commands appropriately, not in acute distress  HEENT: No icterus, No thrush, No neck mass, [place]/AT  Cardiovascular: RRR, S1/S2, no rubs, no gallops  Respiratory: CTA bilaterally, no wheezing, no crackles, no rhonchi  Abdomen: Soft/+BS, LLQ>RLQ pain without rebound, non distended, no guarding; no hepatosplenomegaly  Extremities: No edema, No lymphangitis, No petechiae, No rashes, no synovitis; no cyanosis or clubbing  Data Reviewed: Basic Metabolic Panel:  Recent Labs Lab 12/24/15 1927 12/25/15 0608  NA 139 139  K 4.2 4.0  CL 103 109  CO2 21* 21*  GLUCOSE 159* 104*  BUN 58* 55*  CREATININE 3.10* 2.64*  CALCIUM 8.9 8.1*   Liver Function Tests:  Recent Labs Lab 12/24/15 1927 12/25/15 0608  AST 42* 33  ALT 18 15  ALKPHOS 69 55  BILITOT 0.9 0.7  PROT 6.7 5.3*  ALBUMIN 2.7* 2.1*    Recent Labs Lab 12/24/15 1927  LIPASE 35   No results for input(s): AMMONIA in the last  168 hours. CBC:  Recent Labs Lab 12/24/15 1927 12/25/15 0608  WBC 24.0* 22.0*  NEUTROABS 20.3*  --   HGB 9.2* 7.8*  HCT 29.5* 25.0*  MCV 87.5 87.1  PLT 385 324   Cardiac Enzymes: No results for input(s): CKTOTAL, CKMB, CKMBINDEX, TROPONINI in the last 168 hours. BNP: Invalid input(s): POCBNP CBG:  Recent Labs Lab 12/24/15 2224 12/25/15 0942  GLUCAP 189* 148*    Recent Results (from the past 240 hour(s))  Blood culture (routine x 2)     Status: None (Preliminary result)   Collection Time: 12/24/15  8:43 PM  Result Value Ref Range Status   Specimen Description RIGHT ANTECUBITAL  Final   Special Requests BOTTLES DRAWN AEROBIC AND ANAEROBIC 6CC  Final   Culture NO GROWTH < 12 HOURS  Final   Report Status PENDING  Incomplete  Blood culture (routine x 2)     Status: None (Preliminary result)   Collection Time: 12/24/15  9:05 PM  Result Value Ref Range Status   Specimen Description RIGHT ANTECUBITAL  Final   Special Requests BOTTLES DRAWN AEROBIC AND ANAEROBIC 6CC  Final   Culture PENDING  Incomplete   Report Status PENDING  Incomplete     Scheduled Meds: . aspirin EC  81 mg Oral Daily  . atorvastatin  40 mg Oral q1800  . ciprofloxacin  400 mg Intravenous Q24H  . clopidogrel  75 mg Oral Daily  . heparin  5,000 Units Subcutaneous 3 times per day  . insulin aspart  0-9 Units Subcutaneous TID WC  . metronidazole  500 mg Intravenous Q8H   Continuous Infusions: . sodium chloride 75 mL/hr at 12/24/15 2325  Mollie Rossano, DO  [facility] Pager [phone]  If 7PM-7AM, please contact night-coverage [email or web address] Password [email or web address] 12/25/2015, 11:10 AM   LOS: 1 day

## 2015-12-25 NOTE — Care Management Note (Signed)
Case Management Note  Patient Details  Name: Robin [name] MRN: [id number] Date of Birth: [birth year]  Subjective/Objective:                  Pt admitted with colitis. Pt is from home, lives with her husband and daughter. Pt uses has a cane and walker for mobility if she needs them. Pt is ind with ADL's at baseline. Pt would like to use [facility] if she needs it. HH is not anticipated at DC.  Pt plans to return home with self care at DC.   Action/Plan: Will cont to follow, no CM needs anticipated.   Expected Discharge Date:  12/26/15               Expected Discharge Plan:  Home/Self Care  In-House Referral:  NA  Discharge planning Services  CM Consult  Post Acute Care Choice:  NA Choice offered to:  NA  DME Arranged:    DME Agency:     HH Arranged:    HH Agency:     Status of Service:  In process, will continue to follow  Medicare Important Message Given:  Yes Date Medicare IM Given:    Medicare IM give by:    Date Additional Medicare IM Given:    Additional Medicare Important Message give by:     If discussed at [place] of Stay Meetings, dates discussed:    Additional Comments:  Sherald Barge, RN 12/25/2015, 12:05 PM

## 2015-12-26 LAB — GLUCOSE, CAPILLARY
GLUCOSE-CAPILLARY: 82 mg/dL (ref 65–99)
GLUCOSE-CAPILLARY: 108 mg/dL — AB (ref 65–99)
GLUCOSE-CAPILLARY: 164 mg/dL — AB (ref 65–99)
Glucose-Capillary: 214 mg/dL — ABNORMAL HIGH (ref 65–99)

## 2015-12-26 LAB — BASIC METABOLIC PANEL
ANION GAP: 6 (ref 5–15)
BUN: 45 mg/dL — ABNORMAL HIGH (ref 6–20)
CO2: 21 mmol/L — ABNORMAL LOW (ref 22–32)
Calcium: 7.9 mg/dL — ABNORMAL LOW (ref 8.9–10.3)
Chloride: 112 mmol/L — ABNORMAL HIGH (ref 101–111)
Creatinine, Ser: 2.32 mg/dL — ABNORMAL HIGH (ref 0.44–1.00)
GFR calc Af Amer: 21 mL/min — ABNORMAL LOW (ref >60)
GFR, EST NON AFRICAN AMERICAN: 18 mL/min — AB (ref >60)
GLUCOSE: 102 mg/dL — AB (ref 65–99)
POTASSIUM: 3.4 mmol/L — AB (ref 3.5–5.1)
Sodium: 139 mmol/L (ref 135–145)

## 2015-12-26 LAB — CBC
HCT: 25.2 % — ABNORMAL LOW (ref 36.0–46.0)
HEMOGLOBIN: 7.8 g/dL — AB (ref 12.0–15.0)
MCH: 27 pg (ref 26.0–34.0)
MCHC: 31 g/dL (ref 30.0–36.0)
MCV: 87.2 fL (ref 78.0–100.0)
PLATELETS: 320 10*3/uL (ref 150–400)
RBC: 2.89 MIL/uL — AB (ref 3.87–5.11)
RDW: 17 % — ABNORMAL HIGH (ref 11.5–15.5)
WBC: 20 10*3/uL — AB (ref 4.0–10.5)

## 2015-12-26 LAB — HEMOGLOBIN A1C
Hgb A1c MFr Bld: 6.9 % — ABNORMAL HIGH (ref 4.8–5.6)
MEAN PLASMA GLUCOSE: 151 mg/dL

## 2015-12-26 MED ORDER — PROMETHAZINE HCL 25 MG/ML IJ SOLN
12.5000 mg | Freq: Once | INTRAMUSCULAR | Status: AC
  Administered 2015-12-26: 12.5 mg via INTRAVENOUS
  Filled 2015-12-26: qty 1

## 2015-12-26 NOTE — Progress Notes (Signed)
Patient has had several episodes of loose/watery stools today. Stool sample was obtained and sent to lab for testing. No new concerns or complaints. Patient has tolerated clear liquids well.

## 2015-12-26 NOTE — Progress Notes (Signed)
[facility] PROGRESS NOTE  Robin [name] [id number] DOB: Oct 05, 1928    PCP:   Chevis Pretty, FNP   HPI:  82 year old female with a history of diabetes mellitus type 2, coronary artery disease, paroxysmal atrial fibrillation, hypertension, and diastolic CHF presents with 3 day history of abdominal pain and diarrhea. The patient states that she ate at a restaurant on 12/20/2015. Her daughter. At the same restaurant but did not have any illness. The patient denies any recent antibiotics or travel or unusual or undercooked foods. She has never had an endoscopy/colonoscopy. She states that she had between 5-10 loose bowel movements over the past 2-3 days. She denies any hematochezia, melena, vomiting, hematemesis, dysuria, hematuria. In the ED, the patient was found to have WBC 24.0, serum creatinine 3.10. CT of the abdomen and pelvis showed circumferential thickening in the descending colon. She was started on intravenous fluids and antibiotics.  Since admission, she is feeling better, less abdominal pain.  She still has diarrhea.   Rewiew of Systems:  Constitutional: Negative for malaise, fever and chills. No significant weight loss or weight gain Eyes: Negative for eye pain, redness and discharge, diplopia, visual changes, or flashes of light. ENMT: Negative for ear pain, hoarseness, nasal congestion, sinus pressure and sore throat. No headaches; tinnitus, drooling, or problem swallowing. Cardiovascular: Negative for chest pain, palpitations, diaphoresis, dyspnea and peripheral edema. ; No orthopnea, PND Respiratory: Negative for cough, hemoptysis, wheezing and stridor. No pleuritic chestpain. Gastrointestinal: Negative for nausea, vomiting,  constipation, melena, blood in stool, hematemesis, jaundice and rectal bleeding.    Genitourinary: Negative for frequency, dysuria, incontinence,flank pain and hematuria; Musculoskeletal: Negative for back pain and neck pain. Negative for  swelling and trauma.;  Skin: . Negative for pruritus, rash, abrasions, bruising and skin lesion.; ulcerations Neuro: Negative for headache, lightheadedness and neck stiffness. Negative for weakness, altered level of consciousness , altered mental status, extremity weakness, burning feet, involuntary movement, seizure and syncope.  Psych: negative for anxiety, depression, insomnia, tearfulness, panic attacks, hallucinations, paranoia, suicidal or homicidal ideation    Past Medical History  Diagnosis Date  . Chronic renal insufficiency   . Hypertension   . Hypokalemia   . DM (diabetes mellitus) ([place])   . Myocardial infarction [facility])     NSTEMI/DES LAD, 02/2010  . Shortness of breath   . Cancer ([place])     skin cancer  . Chronic diastolic heart failure (HCC)     EF 60-65%, echo, 02/2012  . Valvular heart disease     Moderate MR; severe TR, echo, 02/2012  . Pulmonary hypertension (HCC)     Severe (RVSP 63 mmHg), echo, 02/2012  . CAD (coronary artery disease)   . Carotid artery disease (HCC)     0000000 RICA; [id number] LICA, [id number]  . HLD (hyperlipidemia)   . Atrial fibrillation ([place])   . Peripheral edema     Past Surgical History  Procedure Laterality Date  . Total abdominal hysterectomy    . Cholecystectomy    . Cardiac catheterization      with stent 2011  . Coronary stent placement  2011  . Cataract extraction w/phaco  08/29/2011    Procedure: CATARACT EXTRACTION PHACO AND INTRAOCULAR LENS PLACEMENT (I[facility]);  Surgeon: Tonny Branch;  Location: [facility];  Service: Ophthalmology;  Laterality: Right;  CDE: 10.25  . Eye surgery  Sept. 2012    right KPE w/ IOL  . Cataract extraction w/phaco  09/08/2011    Procedure: CATARACT EXTRACTION PHACO AND INTRAOCULAR LENS PLACEMENT (  [place]);  Surgeon: Tonny Branch;  Location: [facility];  Service: Ophthalmology;  Laterality: Left;  CDE: 10.77  . Skin cancer excision      multiple    Medications:  HOME MEDS: Prior to Admission medications   Medication Sig Start  Date End Date Taking? Authorizing Provider  aspirin EC 81 MG tablet Take 1 tablet (81 mg total) by mouth daily. 07/22/14  Yes Herminio Commons, MD  atorvastatin (LIPITOR) 40 MG tablet TAKE ONE (1) TABLET EACH DAY 12/04/15  Yes Mary-Margaret Hassell Done, FNP  cholecalciferol (VITAMIN D) 1000 UNITS tablet Take 1,000 Units by mouth daily.   Yes Historical Provider, MD  clopidogrel (PLAVIX) 75 MG tablet TAKE ONE (1) TABLET EACH DAY 12/04/15  Yes Mary-Margaret Hassell Done, FNP  COLCRYS 0.6 MG tablet Take 0.5 tablets (0.3 mg total) by mouth daily. Patient taking differently: Take 0.3 mg by mouth every other day.  05/18/15  Yes Claretta Fraise, MD  feeding supplement, GLUCERNA SHAKE, (GLUCERNA SHAKE) LIQD Take 237 mLs by mouth 2 (two) times daily between meals. 08/23/15  Yes Debbe Odea, MD  fenofibrate micronized (LOFIBRA) 134 MG capsule Take 1 capsule (134 mg total) by mouth daily before breakfast. 10/16/15  Yes Mary-Margaret Hassell Done, FNP  glimepiride (AMARYL) 4 MG tablet Take 0.5 tablets (2 mg total) by mouth daily. 12/04/15  Yes Mary-Margaret Hassell Done, FNP  LANTUS SOLOSTAR 100 UNIT/ML Solostar Pen Inject 10 Units into the skin daily at 10 pm. 12/04/15  Yes Mary-Margaret Hassell Done, FNP  lisinopril (PRINIVIL,ZESTRIL) 20 MG tablet TAKE ONE TABLET BY MOUTH TWICE DAILY 08/31/15  Yes Mary-Margaret Hassell Done, FNP  omeprazole (PRILOSEC) 40 MG capsule TAKE ONE (1) CAPSULE EACH DAY 11/12/15  Yes Mary-Margaret Hassell Done, FNP  potassium chloride (K-DUR) 10 MEQ tablet Take 1 tablet (10 mEq total) by mouth 2 (two) times daily. 12/04/15  Yes Mary-Margaret Hassell Done, FNP  torsemide (DEMADEX) 20 MG tablet Take 1 tablet (20 mg total) by mouth 2 (two) times daily as needed. Swelling; Some days, daughter gives her 2 tablets every other day 12/04/15  Yes Mary-Margaret Hassell Done, FNP  TRADJENTA 5 MG TABS tablet TAKE ONE (1) TABLET EACH DAY 10/09/15  Yes Mary-Margaret Hassell Done, FNP  metoCLOPramide (REGLAN) 10 MG tablet Take 5 mg by mouth daily as needed for  nausea or vomiting. Reported on 12/04/2015 08/07/15   Historical Provider, MD  nitroGLYCERIN (NITROSTAT) 0.4 MG SL tablet Place 1 tablet (0.4 mg total) under the tongue every 5 (five) minutes x 3 doses as needed. Patient taking differently: Place 0.4 mg under the tongue every 5 (five) minutes x 3 doses as needed for chest pain.  07/18/14   Herminio Commons, MD  ondansetron (ZOFRAN) 4 MG tablet Take 1 tablet (4 mg total) by mouth every 8 (eight) hours as needed for nausea or vomiting. 08/23/15   Debbe Odea, MD  UNIFINE PENTIPS 32G X 4 MM MISC EVERY DAY 02/23/15   Mary-Margaret Hassell Done, FNP     Allergies:  No Known Allergies  Social History:   reports that she has never smoked. She has never used smokeless tobacco. She reports that she does not drink alcohol or use illicit drugs.  Family History: Family History  Problem Relation Age of Onset  . Coronary artery disease Neg Hx   . Anesthesia problems Neg Hx   . Hypotension Neg Hx   . Malignant hyperthermia Neg Hx   . Pseudochol deficiency Neg Hx   . CAD Mother   . Heart attack Father   . Cancer Brother  colon     Physical Exam: Filed Vitals:   12/25/15 2043 12/25/15 2209 12/26/15 0548 12/26/15 1400  BP:  112/51 121/53 113/49  Pulse: 80 64 104 76  Temp:  100.2 F (37.9 C) 98.6 F (37 C) 98 F (36.7 C)  TempSrc:  Oral Oral Oral  Resp: 16 20 20 20   Height:      Weight:      SpO2: 96% 97% 100% 100%   Blood pressure 113/49, pulse 76, temperature 98 F (36.7 C), temperature source Oral, resp. rate 20, height 5\' 2"  (1.575 m), weight 52.164 kg (115 lb), SpO2 100 %.  GEN:  Pleasant  patient lying in the stretcher in no acute distress; cooperative with exam. PSYCH:  alert and oriented x4; does not appear anxious or depressed; affect is appropriate. HEENT: Mucous membranes pink and anicteric; PERRLA; EOM intact; no cervical lymphadenopathy nor thyromegaly or carotid bruit; no JVD; There were no stridor. Neck is very  supple. Breasts:: Not examined CHEST WALL: No tenderness CHEST: Normal respiration, clear to auscultation bilaterally.  HEART: Regular rate and rhythm.  There are no murmur, rub, or gallops.   BACK: No kyphosis or scoliosis; no CVA tenderness ABDOMEN: soft and non-tender; no masses, no organomegaly, normal abdominal bowel sounds; no pannus; no intertriginous candida. There is no rebound and no distention. Rectal Exam: Not done EXTREMITIES: No bone or joint deformity; age-appropriate arthropathy of the hands and knees; no edema; no ulcerations.  There is no calf tenderness. Genitalia: not examined PULSES: 2+ and symmetric SKIN: Normal hydration no rash or ulceration CNS: Cranial nerves 2-12 grossly intact no focal lateralizing neurologic deficit.  Speech is fluent; uvula elevated with phonation, facial symmetry and tongue midline. DTR are normal bilaterally, cerebella exam is intact, barbinski is negative and strengths are equaled bilaterally.  No sensory loss.   Labs on Admission:  Basic Metabolic Panel:  Recent Labs Lab 12/24/15 1927 12/25/15 0608 12/26/15 0559  NA 139 139 139  K 4.2 4.0 3.4*  CL 103 109 112*  CO2 21* 21* 21*  GLUCOSE 159* 104* 102*  BUN 58* 55* 45*  CREATININE 3.10* 2.64* 2.32*  CALCIUM 8.9 8.1* 7.9*   Liver Function Tests:  Recent Labs Lab 12/24/15 1927 12/25/15 0608  AST 42* 33  ALT 18 15  ALKPHOS 69 55  BILITOT 0.9 0.7  PROT 6.7 5.3*  ALBUMIN 2.7* 2.1*    Recent Labs Lab 12/24/15 1927  LIPASE 35   No results for input(s): AMMONIA in the last 168 hours. CBC:  Recent Labs Lab 12/24/15 1927 12/25/15 0608 12/26/15 0559  WBC 24.0* 22.0* 20.0*  NEUTROABS 20.3*  --   --   HGB 9.2* 7.8* 7.8*  HCT 29.5* 25.0* 25.2*  MCV 87.5 87.1 87.2  PLT 385 324 320   CBG:  Recent Labs Lab 12/25/15 1627 12/25/15 2216 12/26/15 0719 12/26/15 1143 12/26/15 1630  GLUCAP 155* 156* 82 108* 164*     Radiological Exams on Admission: Ct Abdomen  Pelvis Wo Contrast  12/24/2015  CLINICAL DATA:  82 year old female with abdominal pain, diarrhea and fever for 3 days. Patient with chronic renal failure. EXAM: CT ABDOMEN AND PELVIS WITHOUT CONTRAST TECHNIQUE: Multidetector CT imaging of the abdomen and pelvis was performed following the standard protocol without IV contrast. COMPARISON:  02/01/2010 and 10/25/2010 ultrasound. FINDINGS: Please note that parenchymal abnormalities may be missed without intravenous contrast. Lower chest: A trace right pleural effusion is noted. Cardiomegaly noted. Hepatobiliary: A 1.1 x 1.4 cm fat containing  lesion within the right liver is unchanged and compatible with a benign fatty lesion. The patient is status post cholecystectomy. There is no evidence of intrahepatic biliary dilatation. Pancreas: Unremarkable Spleen: Unremarkable Adrenals/Urinary Tract: Mild bilateral renal cortical atrophy noted. A probable cysts within the left lower pole identified. The adrenal glands and bladder are unremarkable. Stomach/Bowel: There is circumferential wall thickening of the descending colon compatible with colitis. There is equivocal wall thickening of portions of the transverse colon. There is no evidence of bowel obstruction, pneumatosis, pneumoperitoneum or focal collection/abscess. Vascular/Lymphatic: Heavy atherosclerotic calcifications of the aorta, mesenteric arteries and origins are the renal arteries noted. There is no evidence of aortic aneurysm. No enlarged lymph nodes are identified. Reproductive: Patient is status post hysterectomy. There is no evidence of adnexal mass. No free fluid identified. Other: No acute or suspicious abnormality. Musculoskeletal: No acute or suspicious abnormalities are identified. Moderate degenerative changes of the lumbar spine noted. IMPRESSION: Colitis involving the descending colon with equivocal involvement of the transverse colon. Differential includes infectious, inflammatory or ischemic colitis  given heavy atherosclerotic calcification of the mesenteric arteries. No evidence of bowel obstruction, pneumoperitoneum, pneumatosis or abscess. Trace right pleural effusion. Cardiomegaly. Electronically Signed   By: Margarette Canada M.D.   On: 12/24/2015 20:29   Assessment/Plan Present on Admission:  . Colitis . Diabetes mellitus type 2 with complications ([place]) . Anemia in CKD (chronic kidney disease) . CKD (chronic kidney disease), stage IV ([place]) . Acute on chronic kidney failure (HCC)  PLAN:Sepsis -pt with fever 101.4, WBC 24K -Secondary to colitis -Continue intravenous fluids -BC negative thus far.   Colitis -infectious vs ischemic -stool pathogen panel by PCR, still hadn't been collected.  -continue cipro and flagyl -continue IVF -clear liquids for now and advance as tolerated -lactic acid 1.45 -pain control  Diabetes mellitus type 2 -12/04/2015 hemoglobin A1c 7.7 -Hold the Lantus -NovoLog sliding scale -Hold Amaryl and tradjenta  Paroxysmal atrial fibrillation -CHADSVASc = 5 -pt refuses anticoagulation -continue ASA/plavix -Rate controlled  Acute on chronic renal failure (CKD4) -Baseline creatinine 2.4-2.6.  This has improved.  -Continue intravenous fluids judiciously -Hold torsemide and lisinopril  Coronary artery disease -No anginal symptoms -Continue aspirin and Plavix, statin  Chronic diastolic CHF -clinically compensated  Anemia CKD/chronic disease -Baseline Hgb 8-9  Hyperlipidemia -restart statin when able to tolerate po better  Other plans as per orders. Code Status: FULL Haskel Khan, MD.  FACP [facility] Pager [phone] 7pm to 7am.  12/26/2015, 4:44 PM

## 2015-12-27 LAB — COMPREHENSIVE METABOLIC PANEL
ALT: 15 U/L (ref 14–54)
AST: 30 U/L (ref 15–41)
Albumin: 2 g/dL — ABNORMAL LOW (ref 3.5–5.0)
Alkaline Phosphatase: 60 U/L (ref 38–126)
Anion gap: 8 (ref 5–15)
BUN: 41 mg/dL — ABNORMAL HIGH (ref 6–20)
CHLORIDE: 110 mmol/L (ref 101–111)
CO2: 19 mmol/L — AB (ref 22–32)
CREATININE: 2.45 mg/dL — AB (ref 0.44–1.00)
Calcium: 8.1 mg/dL — ABNORMAL LOW (ref 8.9–10.3)
GFR, EST AFRICAN AMERICAN: 20 mL/min — AB (ref >60)
GFR, EST NON AFRICAN AMERICAN: 17 mL/min — AB (ref >60)
Glucose, Bld: 96 mg/dL (ref 65–99)
POTASSIUM: 3.8 mmol/L (ref 3.5–5.1)
SODIUM: 137 mmol/L (ref 135–145)
Total Bilirubin: 0.6 mg/dL (ref 0.3–1.2)
Total Protein: 5.2 g/dL — ABNORMAL LOW (ref 6.5–8.1)

## 2015-12-27 LAB — GLUCOSE, CAPILLARY
GLUCOSE-CAPILLARY: 54 mg/dL — AB (ref 65–99)
GLUCOSE-CAPILLARY: 132 mg/dL — AB (ref 65–99)
GLUCOSE-CAPILLARY: 167 mg/dL — AB (ref 65–99)
GLUCOSE-CAPILLARY: 183 mg/dL — AB (ref 65–99)
GLUCOSE-CAPILLARY: 200 mg/dL — AB (ref 65–99)

## 2015-12-27 LAB — CBC WITH DIFFERENTIAL/PLATELET
BASOS PCT: 0 %
Basophils Absolute: 0 10*3/uL (ref 0.0–0.1)
EOS PCT: 1 %
Eosinophils Absolute: 0.2 10*3/uL (ref 0.0–0.7)
HCT: 26.6 % — ABNORMAL LOW (ref 36.0–46.0)
HEMOGLOBIN: 8.2 g/dL — AB (ref 12.0–15.0)
LYMPHS ABS: 0.8 10*3/uL (ref 0.7–4.0)
Lymphocytes Relative: 4 %
MCH: 26.9 pg (ref 26.0–34.0)
MCHC: 30.8 g/dL (ref 30.0–36.0)
MCV: 87.2 fL (ref 78.0–100.0)
MONOS PCT: 11 %
Monocytes Absolute: 2.1 10*3/uL — ABNORMAL HIGH (ref 0.1–1.0)
NEUTROS ABS: 16.1 10*3/uL — AB (ref 1.7–7.7)
Neutrophils Relative %: 84 %
Platelets: 361 10*3/uL (ref 150–400)
RBC: 3.05 MIL/uL — ABNORMAL LOW (ref 3.87–5.11)
RDW: 17 % — ABNORMAL HIGH (ref 11.5–15.5)
WBC: 19.2 10*3/uL — ABNORMAL HIGH (ref 4.0–10.5)

## 2015-12-27 MED ORDER — LISINOPRIL 10 MG PO TABS
20.0000 mg | ORAL_TABLET | Freq: Every day | ORAL | Status: DC
  Administered 2015-12-27 – 2015-12-28 (×2): 20 mg via ORAL
  Filled 2015-12-27 (×2): qty 2

## 2015-12-27 NOTE — Progress Notes (Signed)
[facility] PROGRESS NOTE  Robin [name] [id number] DOB: 08/01/1928    PCP:   Chevis Pretty, FNP   HPI:  82 year old female with a history of diabetes mellitus type 2, coronary artery disease, paroxysmal atrial fibrillation, hypertension, and diastolic CHF presents with 3 day history of abdominal pain and diarrhea. The patient states that she ate at a restaurant on 12/20/2015. Her daughter. At the same restaurant but did not have any illness. The patient denies any recent antibiotics or travel or unusual or undercooked foods. She has never had an endoscopy/colonoscopy. She states that she had between 5-10 loose bowel movements over the past 2-3 days. She denies any hematochezia, melena, vomiting, hematemesis, dysuria, hematuria. In the ED, the patient was found to have WBC 24.0, serum creatinine 3.10. CT of the abdomen and pelvis showed circumferential thickening in the descending colon. She was started on intravenous fluids and antibiotics. Since admission, she is feeling better, less abdominal pain. She still has diarrhea, but her stool is more formed.  WBC has improved to 19K and stool studies are still pending.    Rewiew of Systems:  Constitutional: Negative for malaise, fever and chills. No significant weight loss or weight gain Eyes: Negative for eye pain, redness and discharge, diplopia, visual changes, or flashes of light. ENMT: Negative for ear pain, hoarseness, nasal congestion, sinus pressure and sore throat. No headaches; tinnitus, drooling, or problem swallowing. Cardiovascular: Negative for chest pain, palpitations, diaphoresis, dyspnea and peripheral edema. ; No orthopnea, PND Respiratory: Negative for cough, hemoptysis, wheezing and stridor. No pleuritic chestpain. Gastrointestinal: Negative for nausea, vomiting, diarrhea, constipation, abdominal pain, melena, blood in stool, hematemesis, jaundice and rectal bleeding.    Genitourinary: Negative for frequency,  dysuria, incontinence,flank pain and hematuria; Musculoskeletal: Negative for back pain and neck pain. Negative for swelling and trauma.;  Skin: . Negative for pruritus, rash, abrasions, bruising and skin lesion.; ulcerations Neuro: Negative for headache, lightheadedness and neck stiffness. Negative for weakness, altered level of consciousness , altered mental status, extremity weakness, burning feet, involuntary movement, seizure and syncope.  Psych: negative for anxiety, depression, insomnia, tearfulness, panic attacks, hallucinations, paranoia, suicidal or homicidal ideation    Past Medical History  Diagnosis Date  . Chronic renal insufficiency   . Hypertension   . Hypokalemia   . DM (diabetes mellitus) ([place])   . Myocardial infarction [facility])     NSTEMI/DES LAD, 02/2010  . Shortness of breath   . Cancer ([place])     skin cancer  . Chronic diastolic heart failure (HCC)     EF 60-65%, echo, 02/2012  . Valvular heart disease     Moderate MR; severe TR, echo, 02/2012  . Pulmonary hypertension (HCC)     Severe (RVSP 63 mmHg), echo, 02/2012  . CAD (coronary artery disease)   . Carotid artery disease (HCC)     0000000 RICA; A999333 LICA, [id number]  . HLD (hyperlipidemia)   . Atrial fibrillation ([place])   . Peripheral edema     Past Surgical History  Procedure Laterality Date  . Total abdominal hysterectomy    . Cholecystectomy    . Cardiac catheterization      with stent 2011  . Coronary stent placement  2011  . Cataract extraction w/phaco  08/29/2011    Procedure: CATARACT EXTRACTION PHACO AND INTRAOCULAR LENS PLACEMENT (IOC);  Surgeon: Tonny Branch;  Location: [facility];  Service: Ophthalmology;  Laterality: Right;  CDE: 10.25  . Eye surgery  Sept. 2012    right KPE w/  IOL  . Cataract extraction w/phaco  09/08/2011    Procedure: CATARACT EXTRACTION PHACO AND INTRAOCULAR LENS PLACEMENT (IOC);  Surgeon: Tonny Branch;  Location: [facility];  Service: Ophthalmology;  Laterality: Left;  CDE: 10.77  . Skin  cancer excision      multiple    Medications:  HOME MEDS: Prior to Admission medications   Medication Sig Start Date End Date Taking? Authorizing Provider  aspirin EC 81 MG tablet Take 1 tablet (81 mg total) by mouth daily. 07/22/14  Yes Herminio Commons, MD  atorvastatin (LIPITOR) 40 MG tablet TAKE ONE (1) TABLET EACH DAY 12/04/15  Yes Mary-Margaret Hassell Done, FNP  cholecalciferol (VITAMIN D) 1000 UNITS tablet Take 1,000 Units by mouth daily.   Yes Historical Provider, MD  clopidogrel (PLAVIX) 75 MG tablet TAKE ONE (1) TABLET EACH DAY 12/04/15  Yes Mary-Margaret Hassell Done, FNP  COLCRYS 0.6 MG tablet Take 0.5 tablets (0.3 mg total) by mouth daily. Patient taking differently: Take 0.3 mg by mouth every other day.  05/18/15  Yes Claretta Fraise, MD  feeding supplement, GLUCERNA SHAKE, (GLUCERNA SHAKE) LIQD Take 237 mLs by mouth 2 (two) times daily between meals. 08/23/15  Yes Debbe Odea, MD  fenofibrate micronized (LOFIBRA) 134 MG capsule Take 1 capsule (134 mg total) by mouth daily before breakfast. 10/16/15  Yes Mary-Margaret Hassell Done, FNP  glimepiride (AMARYL) 4 MG tablet Take 0.5 tablets (2 mg total) by mouth daily. 12/04/15  Yes Mary-Margaret Hassell Done, FNP  LANTUS SOLOSTAR 100 UNIT/ML Solostar Pen Inject 10 Units into the skin daily at 10 pm. 12/04/15  Yes Mary-Margaret Hassell Done, FNP  lisinopril (PRINIVIL,ZESTRIL) 20 MG tablet TAKE ONE TABLET BY MOUTH TWICE DAILY 08/31/15  Yes Mary-Margaret Hassell Done, FNP  omeprazole (PRILOSEC) 40 MG capsule TAKE ONE (1) CAPSULE EACH DAY 11/12/15  Yes Mary-Margaret Hassell Done, FNP  potassium chloride (K-DUR) 10 MEQ tablet Take 1 tablet (10 mEq total) by mouth 2 (two) times daily. 12/04/15  Yes Mary-Margaret Hassell Done, FNP  torsemide (DEMADEX) 20 MG tablet Take 1 tablet (20 mg total) by mouth 2 (two) times daily as needed. Swelling; Some days, daughter gives her 2 tablets every other day 12/04/15  Yes Mary-Margaret Hassell Done, FNP  TRADJENTA 5 MG TABS tablet TAKE ONE (1) TABLET EACH  DAY 10/09/15  Yes Mary-Margaret Hassell Done, FNP  metoCLOPramide (REGLAN) 10 MG tablet Take 5 mg by mouth daily as needed for nausea or vomiting. Reported on 12/04/2015 08/07/15   Historical Provider, MD  nitroGLYCERIN (NITROSTAT) 0.4 MG SL tablet Place 1 tablet (0.4 mg total) under the tongue every 5 (five) minutes x 3 doses as needed. Patient taking differently: Place 0.4 mg under the tongue every 5 (five) minutes x 3 doses as needed for chest pain.  07/18/14   Herminio Commons, MD  ondansetron (ZOFRAN) 4 MG tablet Take 1 tablet (4 mg total) by mouth every 8 (eight) hours as needed for nausea or vomiting. 08/23/15   Debbe Odea, MD  UNIFINE PENTIPS 32G X 4 MM MISC EVERY DAY 02/23/15   Mary-Margaret Hassell Done, FNP     Allergies:  No Known Allergies  Social History:   reports that she has never smoked. She has never used smokeless tobacco. She reports that she does not drink alcohol or use illicit drugs.  Family History: Family History  Problem Relation Age of Onset  . Coronary artery disease Neg Hx   . Anesthesia problems Neg Hx   . Hypotension Neg Hx   . Malignant hyperthermia Neg Hx   . Pseudochol deficiency Neg  Hx   . CAD Mother   . Heart attack Father   . Cancer Brother     colon     Physical Exam: Filed Vitals:   12/26/15 2108 12/26/15 2137 12/27/15 0442 12/27/15 1420  BP:  130/36 120/46 146/41  Pulse: 70 70 69 72  Temp:  98.7 F (37.1 C) 99.7 F (37.6 C) 99.5 F (37.5 C)  TempSrc:  Oral Oral Oral  Resp: 16 16 16 16   Height:      Weight:      SpO2: 98% 100% 99% 100%   Blood pressure 146/41, pulse 72, temperature 99.5 F (37.5 C), temperature source Oral, resp. rate 16, height 5\' 2"  (1.575 m), weight 52.164 kg (115 lb), SpO2 100 %.  GEN:  Pleasant  patient lying in the stretcher in no acute distress; cooperative with exam. PSYCH:  alert and oriented x4; does not appear anxious or depressed; affect is appropriate. HEENT: Mucous membranes pink and anicteric; PERRLA; EOM  intact; no cervical lymphadenopathy nor thyromegaly or carotid bruit; no JVD; There were no stridor. Neck is very supple. Breasts:: Not examined CHEST WALL: No tenderness CHEST: Normal respiration, clear to auscultation bilaterally.  HEART: Regular rate and rhythm.  There are no murmur, rub, or gallops.   BACK: No kyphosis or scoliosis; no CVA tenderness ABDOMEN: soft and non-tender; no masses, no organomegaly, normal abdominal bowel sounds; no pannus; no intertriginous candida. There is no rebound and no distention. Rectal Exam: Not done EXTREMITIES: No bone or joint deformity; age-appropriate arthropathy of the hands and knees; no edema; no ulcerations.  There is no calf tenderness. Genitalia: not examined PULSES: 2+ and symmetric SKIN: Normal hydration no rash or ulceration CNS: Cranial nerves 2-12 grossly intact no focal lateralizing neurologic deficit.  Speech is fluent; uvula elevated with phonation, facial symmetry and tongue midline. DTR are normal bilaterally, cerebella exam is intact, barbinski is negative and strengths are equaled bilaterally.  No sensory loss.   Labs on Admission:  Basic Metabolic Panel:  Recent Labs Lab 12/24/15 1927 12/25/15 0608 12/26/15 0559 12/27/15 0825  NA 139 139 139 137  K 4.2 4.0 3.4* 3.8  CL 103 109 112* 110  CO2 21* 21* 21* 19*  GLUCOSE 159* 104* 102* 96  BUN 58* 55* 45* 41*  CREATININE 3.10* 2.64* 2.32* 2.45*  CALCIUM 8.9 8.1* 7.9* 8.1*   Liver Function Tests:  Recent Labs Lab 12/24/15 1927 12/25/15 0608 12/27/15 0825  AST 42* 33 30  ALT 18 15 15   ALKPHOS 69 55 60  BILITOT 0.9 0.7 0.6  PROT 6.7 5.3* 5.2*  ALBUMIN 2.7* 2.1* 2.0*    Recent Labs Lab 12/24/15 1927  LIPASE 35   No results for input(s): AMMONIA in the last 168 hours. CBC:  Recent Labs Lab 12/24/15 1927 12/25/15 0608 12/26/15 0559 12/27/15 0825  WBC 24.0* 22.0* 20.0* 19.2*  NEUTROABS 20.3*  --   --  16.1*  HGB 9.2* 7.8* 7.8* 8.2*  HCT 29.5* 25.0* 25.2*  26.6*  MCV 87.5 87.1 87.2 87.2  PLT 385 324 320 361   CBG:  Recent Labs Lab 12/26/15 1630 12/26/15 2043 12/27/15 0759 12/27/15 0914 12/27/15 1119  GLUCAP 164* 214* 54* 132* 167*    Assessment/Plan Present on Admission:  . Colitis . Diabetes mellitus type 2 with complications ([place]) . Anemia in CKD (chronic kidney disease) . CKD (chronic kidney disease), stage IV ([place]) . Acute on chronic kidney failure (HCC)   PLAN:Sepsis -pt with fever 101.4, WBC 24K down  to 19K -Secondary to colitis -Continue intravenous fluids -BC negative thus far.   Colitis -infectious vs ischemic -stool studies pending.  -continue cipro and flagyl -continue IVF -will advance her diet.  -lactic acid 1.45 -pain control  Diabetes mellitus type 2 -12/04/2015 hemoglobin A1c 7.7 -Hold the Lantus -NovoLog sliding scale -Hold Amaryl and tradjenta  Paroxysmal atrial fibrillation -CHADSVASc = 5 -pt refuses anticoagulation -continue ASA/plavix -Rate controlled  Acute on chronic renal failure (CKD4) -Baseline creatinine 2.4-2.6. This has improved.  -Continue intravenous fluids judiciously -Hold torsemide, but will resume lisinopril.   Coronary artery disease -No anginal symptoms -Continue aspirin and Plavix, statin  Chronic diastolic CHF -clinically compensated  Anemia CKD/chronic disease -Baseline Hgb 8-9  Hyperlipidemia -restart statin when able to tolerate po better  Other plans as per orders. Code Status: FULL Haskel Khan, MD.  FACP [facility] Pager [phone] 7pm to 7am.  12/27/2015, 2:26 PM

## 2015-12-27 NOTE — Progress Notes (Signed)
Pt's CBG 54 but asymptomatic. Pt offered 4 ozs regular soda and breakfast. Pt CBG rechecked and is now 132.

## 2015-12-28 LAB — CBC WITH DIFFERENTIAL/PLATELET
BASOS PCT: 1 %
Basophils Absolute: 0.1 10*3/uL (ref 0.0–0.1)
EOS ABS: 0.3 10*3/uL (ref 0.0–0.7)
Eosinophils Relative: 2 %
HEMATOCRIT: 24.6 % — AB (ref 36.0–46.0)
HEMOGLOBIN: 7.7 g/dL — AB (ref 12.0–15.0)
LYMPHS ABS: 1.2 10*3/uL (ref 0.7–4.0)
Lymphocytes Relative: 8 %
MCH: 27 pg (ref 26.0–34.0)
MCHC: 31.3 g/dL (ref 30.0–36.0)
MCV: 86.3 fL (ref 78.0–100.0)
Monocytes Absolute: 2.3 10*3/uL — ABNORMAL HIGH (ref 0.1–1.0)
Monocytes Relative: 15 %
NEUTROS ABS: 11.1 10*3/uL — AB (ref 1.7–7.7)
NEUTROS PCT: 75 %
Platelets: 327 10*3/uL (ref 150–400)
RBC: 2.85 MIL/uL — AB (ref 3.87–5.11)
RDW: 17.1 % — ABNORMAL HIGH (ref 11.5–15.5)
WBC: 14.9 10*3/uL — AB (ref 4.0–10.5)

## 2015-12-28 LAB — BASIC METABOLIC PANEL
ANION GAP: 8 (ref 5–15)
BUN: 44 mg/dL — AB (ref 6–20)
CHLORIDE: 109 mmol/L (ref 101–111)
CO2: 18 mmol/L — AB (ref 22–32)
Calcium: 7.9 mg/dL — ABNORMAL LOW (ref 8.9–10.3)
Creatinine, Ser: 2.65 mg/dL — ABNORMAL HIGH (ref 0.44–1.00)
GFR calc Af Amer: 18 mL/min — ABNORMAL LOW (ref >60)
GFR calc non Af Amer: 15 mL/min — ABNORMAL LOW (ref >60)
GLUCOSE: 173 mg/dL — AB (ref 65–99)
POTASSIUM: 4.4 mmol/L (ref 3.5–5.1)
Sodium: 135 mmol/L (ref 135–145)

## 2015-12-28 LAB — GLUCOSE, CAPILLARY
GLUCOSE-CAPILLARY: 60 mg/dL — AB (ref 65–99)
GLUCOSE-CAPILLARY: 123 mg/dL — AB (ref 65–99)
GLUCOSE-CAPILLARY: 153 mg/dL — AB (ref 65–99)
Glucose-Capillary: 92 mg/dL (ref 65–99)
Glucose-Capillary: 57 mg/dL — ABNORMAL LOW (ref 65–99)
Glucose-Capillary: 133 mg/dL — ABNORMAL HIGH (ref 65–99)

## 2015-12-28 MED ORDER — SODIUM CHLORIDE 0.9 % IJ SOLN
3.0000 mL | INTRAMUSCULAR | Status: DC | PRN
  Administered 2015-12-29: 3 mL via INTRAVENOUS
  Filled 2015-12-28: qty 3

## 2015-12-28 MED ORDER — HYDRALAZINE HCL 25 MG PO TABS
25.0000 mg | ORAL_TABLET | Freq: Three times a day (TID) | ORAL | Status: DC
  Administered 2015-12-28 – 2015-12-29 (×3): 25 mg via ORAL
  Filled 2015-12-28 (×3): qty 1

## 2015-12-28 MED ORDER — SODIUM CHLORIDE 0.9 % IJ SOLN
3.0000 mL | Freq: Two times a day (BID) | INTRAMUSCULAR | Status: DC
  Administered 2015-12-28: 3 mL via INTRAVENOUS

## 2015-12-28 MED ORDER — SODIUM CHLORIDE 0.9 % IV SOLN
250.0000 mL | INTRAVENOUS | Status: DC | PRN
  Administered 2015-12-29: 250 mL via INTRAVENOUS

## 2015-12-28 MED ORDER — COLCHICINE 0.6 MG PO TABS
0.6000 mg | ORAL_TABLET | Freq: Two times a day (BID) | ORAL | Status: DC
  Administered 2015-12-28 – 2015-12-29 (×3): 0.6 mg via ORAL
  Filled 2015-12-28 (×3): qty 1

## 2015-12-28 NOTE — Progress Notes (Signed)
Inpatient Diabetes Program Recommendations  AACE/ADA: New Consensus Statement on Inpatient Glycemic Control (2015)  Target Ranges:  Prepandial:   less than 140 mg/dL      Peak postprandial:   less than 180 mg/dL (1-2 hours)      Critically ill patients:  140 - 180 mg/dL   Review of Glycemic Control  Diabetes history: DM 2  Outpatient Diabetes medications: Amaryl 2 mg bid, Tradjenta 5 mg, Lantus 10 units at HS Current orders for Inpatient glycemic control: Sensitive correction tidwc  Inpatient Diabetes Program Recommendations:    Patient having some fasting hypoglycemia. May want to consider using a milder custom correction scale. Although the fasting glucose is lower than that at the HS prior to the next am, the novolog may not be cleared by the kidneys for hours following. Recommend during this period using a custom correction scale that begins at 200 mg/dL with 1 unit, 2 units at 250 mg/dL. And increase by 1 unit for every 50 mg/dl rise in glucose.   Thank you Rosita Kea, RN, MSN, CDE  Diabetes Inpatient Program Office: [phone] Pager: [phone] 8:00 am to 5:00 pm

## 2015-12-28 NOTE — Progress Notes (Signed)
[facility] PROGRESS NOTE  Robin [name] [id number] DOB: 12-08-1927    PCP:   Chevis Pretty, FNP   HPI:  82 year old female with a history of diabetes mellitus type 2, coronary artery disease, paroxysmal atrial fibrillation, hypertension, and diastolic CHF presents with 3 day history of abdominal pain and diarrhea. The patient states that she ate at a restaurant on 12/20/2015. Her daughter. At the same restaurant but did not have any illness. The patient denies any recent antibiotics or travel or unusual or undercooked foods. She has never had an endoscopy/colonoscopy. She states that she had between 5-10 loose bowel movements over the past 2-3 days. She denies any hematochezia, melena, vomiting, hematemesis, dysuria, hematuria. In the ED, the patient was found to have WBC 24.0, serum creatinine 3.10. CT of the abdomen and pelvis showed circumferential thickening in the descending colon. She was started on intravenous fluids and antibiotics. Since admission, she is feeling better, less abdominal pain. She still has diarrhea, but her stool is more formed. WBC has improved to 19K and stool studies are still pending. She has a gout flare on her left ankle.  She has been on Colchicine at home which was held in the hospital.    Rewiew of Systems:  Constitutional: Negative for malaise, fever and chills. No significant weight loss or weight gain Eyes: Negative for eye pain, redness and discharge, diplopia, visual changes, or flashes of light. ENMT: Negative for ear pain, hoarseness, nasal congestion, sinus pressure and sore throat. No headaches; tinnitus, drooling, or problem swallowing. Cardiovascular: Negative for chest pain, palpitations, diaphoresis, dyspnea and peripheral edema. ; No orthopnea, PND Respiratory: Negative for cough, hemoptysis, wheezing and stridor. No pleuritic chestpain. Gastrointestinal: Negative for nausea, vomiting, diarrhea, constipation, abdominal pain, melena,  blood in stool, hematemesis, jaundice and rectal bleeding.    Genitourinary: Negative for frequency, dysuria, incontinence,flank pain and hematuria; Musculoskeletal: Negative for back pain and neck pain. Negative for swelling and trauma.;  Skin: . Negative for pruritus, rash, abrasions, bruising and skin lesion.; ulcerations Neuro: Negative for headache, lightheadedness and neck stiffness. Negative for weakness, altered level of consciousness , altered mental status, extremity weakness, burning feet, involuntary movement, seizure and syncope.  Psych: negative for anxiety, depression, insomnia, tearfulness, panic attacks, hallucinations, paranoia, suicidal or homicidal ideation   Past Medical History  Diagnosis Date  . Chronic renal insufficiency   . Hypertension   . Hypokalemia   . DM (diabetes mellitus) ([place])   . Myocardial infarction [facility])     NSTEMI/DES LAD, 02/2010  . Shortness of breath   . Cancer ([place])     skin cancer  . Chronic diastolic heart failure (HCC)     EF 60-65%, echo, 02/2012  . Valvular heart disease     Moderate MR; severe TR, echo, 02/2012  . Pulmonary hypertension (HCC)     Severe (RVSP 63 mmHg), echo, 02/2012  . CAD (coronary artery disease)   . Carotid artery disease (HCC)     0000000 RICA; A999333 LICA, [id number]  . HLD (hyperlipidemia)   . Atrial fibrillation ([place])   . Peripheral edema     Past Surgical History  Procedure Laterality Date  . Total abdominal hysterectomy    . Cholecystectomy    . Cardiac catheterization      with stent 2011  . Coronary stent placement  2011  . Cataract extraction w/phaco  08/29/2011    Procedure: CATARACT EXTRACTION PHACO AND INTRAOCULAR LENS PLACEMENT (IOC);  Surgeon: Tonny Branch;  Location: [facility];  Service: Ophthalmology;  Laterality: Right;  CDE: 10.25  . Eye surgery  Sept. 2012    right KPE w/ IOL  . Cataract extraction w/phaco  09/08/2011    Procedure: CATARACT EXTRACTION PHACO AND INTRAOCULAR LENS PLACEMENT (IOC);  Surgeon:  Tonny Branch;  Location: [facility];  Service: Ophthalmology;  Laterality: Left;  CDE: 10.77  . Skin cancer excision      multiple    Medications:  HOME MEDS: Prior to Admission medications   Medication Sig Start Date End Date Taking? Authorizing Provider  aspirin EC 81 MG tablet Take 1 tablet (81 mg total) by mouth daily. 07/22/14  Yes Herminio Commons, MD  atorvastatin (LIPITOR) 40 MG tablet TAKE ONE (1) TABLET EACH DAY 12/04/15  Yes Mary-Margaret Hassell Done, FNP  cholecalciferol (VITAMIN D) 1000 UNITS tablet Take 1,000 Units by mouth daily.   Yes Historical Provider, MD  clopidogrel (PLAVIX) 75 MG tablet TAKE ONE (1) TABLET EACH DAY 12/04/15  Yes Mary-Margaret Hassell Done, FNP  COLCRYS 0.6 MG tablet Take 0.5 tablets (0.3 mg total) by mouth daily. Patient taking differently: Take 0.3 mg by mouth every other day.  05/18/15  Yes Claretta Fraise, MD  feeding supplement, GLUCERNA SHAKE, (GLUCERNA SHAKE) LIQD Take 237 mLs by mouth 2 (two) times daily between meals. 08/23/15  Yes Debbe Odea, MD  fenofibrate micronized (LOFIBRA) 134 MG capsule Take 1 capsule (134 mg total) by mouth daily before breakfast. 10/16/15  Yes Mary-Margaret Hassell Done, FNP  glimepiride (AMARYL) 4 MG tablet Take 0.5 tablets (2 mg total) by mouth daily. 12/04/15  Yes Mary-Margaret Hassell Done, FNP  LANTUS SOLOSTAR 100 UNIT/ML Solostar Pen Inject 10 Units into the skin daily at 10 pm. 12/04/15  Yes Mary-Margaret Hassell Done, FNP  lisinopril (PRINIVIL,ZESTRIL) 20 MG tablet TAKE ONE TABLET BY MOUTH TWICE DAILY 08/31/15  Yes Mary-Margaret Hassell Done, FNP  omeprazole (PRILOSEC) 40 MG capsule TAKE ONE (1) CAPSULE EACH DAY 11/12/15  Yes Mary-Margaret Hassell Done, FNP  potassium chloride (K-DUR) 10 MEQ tablet Take 1 tablet (10 mEq total) by mouth 2 (two) times daily. 12/04/15  Yes Mary-Margaret Hassell Done, FNP  torsemide (DEMADEX) 20 MG tablet Take 1 tablet (20 mg total) by mouth 2 (two) times daily as needed. Swelling; Some days, daughter gives her 2 tablets every other day  12/04/15  Yes Mary-Margaret Hassell Done, FNP  TRADJENTA 5 MG TABS tablet TAKE ONE (1) TABLET EACH DAY 10/09/15  Yes Mary-Margaret Hassell Done, FNP  metoCLOPramide (REGLAN) 10 MG tablet Take 5 mg by mouth daily as needed for nausea or vomiting. Reported on 12/04/2015 08/07/15   Historical Provider, MD  nitroGLYCERIN (NITROSTAT) 0.4 MG SL tablet Place 1 tablet (0.4 mg total) under the tongue every 5 (five) minutes x 3 doses as needed. Patient taking differently: Place 0.4 mg under the tongue every 5 (five) minutes x 3 doses as needed for chest pain.  07/18/14   Herminio Commons, MD  ondansetron (ZOFRAN) 4 MG tablet Take 1 tablet (4 mg total) by mouth every 8 (eight) hours as needed for nausea or vomiting. 08/23/15   Debbe Odea, MD  UNIFINE PENTIPS 32G X 4 MM MISC EVERY DAY 02/23/15   Mary-Margaret Hassell Done, FNP     Allergies:  No Known Allergies  Social History:   reports that she has never smoked. She has never used smokeless tobacco. She reports that she does not drink alcohol or use illicit drugs.  Family History: Family History  Problem Relation Age of Onset  . Coronary artery disease Neg Hx   . Anesthesia problems  Neg Hx   . Hypotension Neg Hx   . Malignant hyperthermia Neg Hx   . Pseudochol deficiency Neg Hx   . CAD Mother   . Heart attack Father   . Cancer Brother     colon     Physical Exam: Filed Vitals:   12/27/15 2115 12/27/15 2131 12/28/15 0630 12/28/15 1034  BP:  118/38 141/46 149/48  Pulse:   70   Temp:  101.1 F (38.4 C) 99.2 F (37.3 C)   TempSrc:  Oral Oral   Resp:   16   Height:      Weight:      SpO2: 98% 100% 97%    Blood pressure 149/48, pulse 70, temperature 99.2 F (37.3 C), temperature source Oral, resp. rate 16, height 5\' 2"  (1.575 m), weight 52.164 kg (115 lb), SpO2 97 %.  GEN:  Pleasant patient lying in the stretcher in no acute distress; cooperative with exam. PSYCH:  alert and oriented x4; does not appear anxious or depressed; affect is  appropriate. HEENT: Mucous membranes pink and anicteric; PERRLA; EOM intact; no cervical lymphadenopathy nor thyromegaly or carotid bruit; no JVD; There were no stridor. Neck is very supple. Breasts:: Not examined CHEST WALL: No tenderness CHEST: Normal respiration, clear to auscultation bilaterally.  HEART: Regular rate and rhythm.  There are no murmur, rub, or gallops.   BACK: No kyphosis or scoliosis; no CVA tenderness ABDOMEN: soft and non-tender; no masses, no organomegaly, normal abdominal bowel sounds; no pannus; no intertriginous candida. There is no rebound and no distention. Rectal Exam: Not done EXTREMITIES: No bone or joint deformity; age-appropriate arthropathy of the hands and knees; no edema; no ulcerations.  There is no calf tenderness. Genitalia: not examined PULSES: 2+ and symmetric SKIN: Normal hydration no rash or ulceration CNS: Cranial nerves 2-12 grossly intact no focal lateralizing neurologic deficit.  Speech is fluent; uvula elevated with phonation, facial symmetry and tongue midline. DTR are normal bilaterally, cerebella exam is intact, barbinski is negative and strengths are equaled bilaterally.  No sensory loss.   Labs on Admission:  Basic Metabolic Panel:  Recent Labs Lab 12/24/15 1927 12/25/15 0608 12/26/15 0559 12/27/15 0825 12/28/15 0850  NA 139 139 139 137 135  K 4.2 4.0 3.4* 3.8 4.4  CL 103 109 112* 110 109  CO2 21* 21* 21* 19* 18*  GLUCOSE 159* 104* 102* 96 173*  BUN 58* 55* 45* 41* 44*  CREATININE 3.10* 2.64* 2.32* 2.45* 2.65*  CALCIUM 8.9 8.1* 7.9* 8.1* 7.9*   Liver Function Tests:  Recent Labs Lab 12/24/15 1927 12/25/15 0608 12/27/15 0825  AST 42* 33 30  ALT 18 15 15   ALKPHOS 69 55 60  BILITOT 0.9 0.7 0.6  PROT 6.7 5.3* 5.2*  ALBUMIN 2.7* 2.1* 2.0*    Recent Labs Lab 12/24/15 1927  LIPASE 35   CBC:  Recent Labs Lab 12/24/15 1927 12/25/15 0608 12/26/15 0559 12/27/15 0825 12/28/15 0850  WBC 24.0* 22.0* 20.0* 19.2*  14.9*  NEUTROABS 20.3*  --   --  16.1* 11.1*  HGB 9.2* 7.8* 7.8* 8.2* 7.7*  HCT 29.5* 25.0* 25.2* 26.6* 24.6*  MCV 87.5 87.1 87.2 87.2 86.3  PLT 385 324 320 361 327   Cardiac Enzymes:  CBG:  Recent Labs Lab 12/27/15 1119 12/27/15 1624 12/27/15 2129 12/28/15 0726 12/28/15 0832  GLUCAP 167* 183* 200* 60* 123*   Assessment/Plan Present on Admission:  . Colitis . Diabetes mellitus type 2 with complications ([place]) . Anemia in CKD (chronic  kidney disease) . CKD (chronic kidney disease), stage IV ([place]) . Acute on chronic kidney failure (HCC)   PLAN:  Sepsis -pt with fever 101.4, WBC 24K down to 19K -Secondary to colitis -Continue intravenous fluids -BC negative thus far.   Colitis -infectious vs ischemic -stool studies pending.  -continue cipro and flagyl -continue IVF -will advance her diet.  -lactic acid 1.45 -pain control  Diabetes mellitus type 2 -12/04/2015 hemoglobin A1c 7.7 -Hold the Lantus -NovoLog sliding scale -Hold Amaryl and tradjenta  Paroxysmal atrial fibrillation -CHADSVASc = 5 -pt refuses anticoagulation -continue ASA/plavix -Rate controlled  Acute on chronic renal failure (CKD4) -Baseline creatinine 2.4-2.6. This has improved.  -Continue intravenous fluids judiciously -Will hold Lisinopril.  Gout:  Will give colchicine BID.  Coronary artery disease -No anginal symptoms -Continue aspirin and Plavix, statin  Chronic diastolic CHF -clinically compensated  Anemia CKD/chronic disease -Baseline Hgb 8-9  Hyperlipidemia -restart statin when able to tolerate po better  Other plans as per orders. Code Status: FULL Haskel Khan, MD.  FACP [facility] Pager [phone] 7pm to 7am.  12/28/2015, 11:25 AM

## 2015-12-29 ENCOUNTER — Inpatient Hospital Stay (HOSPITAL_COMMUNITY): Payer: Medicare Other

## 2015-12-29 ENCOUNTER — Inpatient Hospital Stay (HOSPITAL_COMMUNITY)
Admission: EM | Admit: 2015-12-29 | Discharge: 2016-01-04 | Disposition: A | Discharge status: Skilled Nursing Facility | Payer: Medicare Other | Source: Home / Self Care | Attending: Internal Medicine | Admitting: Internal Medicine

## 2015-12-29 ENCOUNTER — Emergency Department (HOSPITAL_COMMUNITY): Payer: Medicare Other

## 2015-12-29 ENCOUNTER — Encounter (HOSPITAL_COMMUNITY): Payer: Self-pay | Admitting: Emergency Medicine

## 2015-12-29 DIAGNOSIS — E118 Type 2 diabetes mellitus with unspecified complications: Secondary | ICD-10-CM

## 2015-12-29 DIAGNOSIS — E875 Hyperkalemia: Secondary | ICD-10-CM | POA: Diagnosis present

## 2015-12-29 DIAGNOSIS — D631 Anemia in chronic kidney disease: Secondary | ICD-10-CM | POA: Diagnosis present

## 2015-12-29 DIAGNOSIS — I10 Essential (primary) hypertension: Secondary | ICD-10-CM | POA: Diagnosis present

## 2015-12-29 DIAGNOSIS — R9431 Abnormal electrocardiogram [ECG] [EKG]: Secondary | ICD-10-CM | POA: Diagnosis present

## 2015-12-29 DIAGNOSIS — I4891 Unspecified atrial fibrillation: Secondary | ICD-10-CM | POA: Diagnosis present

## 2015-12-29 DIAGNOSIS — K529 Noninfective gastroenteritis and colitis, unspecified: Secondary | ICD-10-CM

## 2015-12-29 DIAGNOSIS — R600 Localized edema: Secondary | ICD-10-CM

## 2015-12-29 DIAGNOSIS — M109 Gout, unspecified: Secondary | ICD-10-CM | POA: Insufficient documentation

## 2015-12-29 DIAGNOSIS — R5381 Other malaise: Secondary | ICD-10-CM | POA: Diagnosis present

## 2015-12-29 DIAGNOSIS — I5032 Chronic diastolic (congestive) heart failure: Secondary | ICD-10-CM

## 2015-12-29 DIAGNOSIS — N179 Acute kidney failure, unspecified: Secondary | ICD-10-CM | POA: Diagnosis present

## 2015-12-29 DIAGNOSIS — I2510 Atherosclerotic heart disease of native coronary artery without angina pectoris: Secondary | ICD-10-CM | POA: Diagnosis present

## 2015-12-29 DIAGNOSIS — E860 Dehydration: Secondary | ICD-10-CM | POA: Diagnosis present

## 2015-12-29 DIAGNOSIS — M1009 Idiopathic gout, multiple sites: Secondary | ICD-10-CM

## 2015-12-29 DIAGNOSIS — Z794 Long term (current) use of insulin: Secondary | ICD-10-CM

## 2015-12-29 DIAGNOSIS — M25579 Pain in unspecified ankle and joints of unspecified foot: Secondary | ICD-10-CM

## 2015-12-29 DIAGNOSIS — N189 Chronic kidney disease, unspecified: Secondary | ICD-10-CM

## 2015-12-29 DIAGNOSIS — R140 Abdominal distension (gaseous): Secondary | ICD-10-CM | POA: Insufficient documentation

## 2015-12-29 LAB — URINALYSIS, ROUTINE W REFLEX MICROSCOPIC
Glucose, UA: NEGATIVE mg/dL
Hgb urine dipstick: NEGATIVE
Ketones, ur: 15 mg/dL — AB
NITRITE: POSITIVE — AB
Protein, ur: NEGATIVE mg/dL
SPECIFIC GRAVITY, URINE: 1.017 (ref 1.005–1.030)
pH: 5 (ref 5.0–8.0)

## 2015-12-29 LAB — CULTURE, BLOOD (ROUTINE X 2)
Culture: NO GROWTH
Culture: NO GROWTH

## 2015-12-29 LAB — BASIC METABOLIC PANEL
Anion gap: 11 (ref 5–15)
BUN: 52 mg/dL — ABNORMAL HIGH (ref 6–20)
CHLORIDE: 106 mmol/L (ref 101–111)
CO2: 16 mmol/L — AB (ref 22–32)
CREATININE: 2.9 mg/dL — AB (ref 0.44–1.00)
Calcium: 8.8 mg/dL — ABNORMAL LOW (ref 8.9–10.3)
GFR calc non Af Amer: 14 mL/min — ABNORMAL LOW (ref >60)
GFR, EST AFRICAN AMERICAN: 16 mL/min — AB (ref >60)
Glucose, Bld: 186 mg/dL — ABNORMAL HIGH (ref 65–99)
POTASSIUM: 5.5 mmol/L — AB (ref 3.5–5.1)
Sodium: 133 mmol/L — ABNORMAL LOW (ref 135–145)

## 2015-12-29 LAB — CBC
HEMATOCRIT: 27.7 % — AB (ref 36.0–46.0)
HEMOGLOBIN: 8.3 g/dL — AB (ref 12.0–15.0)
MCH: 25.4 pg — AB (ref 26.0–34.0)
MCHC: 30 g/dL (ref 30.0–36.0)
MCV: 84.7 fL (ref 78.0–100.0)
PLATELETS: 419 10*3/uL — AB (ref 150–400)
RBC: 3.27 MIL/uL — AB (ref 3.87–5.11)
RDW: 17.1 % — ABNORMAL HIGH (ref 11.5–15.5)
WBC: 16.6 10*3/uL — ABNORMAL HIGH (ref 4.0–10.5)

## 2015-12-29 LAB — GLUCOSE, CAPILLARY
GLUCOSE-CAPILLARY: 154 mg/dL — AB (ref 65–99)
Glucose-Capillary: 123 mg/dL — ABNORMAL HIGH (ref 65–99)
Glucose-Capillary: 169 mg/dL — ABNORMAL HIGH (ref 65–99)

## 2015-12-29 LAB — URINE MICROSCOPIC-ADD ON

## 2015-12-29 LAB — FECAL LACTOFERRIN, QUANT: Fecal Lactoferrin: POSITIVE

## 2015-12-29 LAB — LACTIC ACID, PLASMA: LACTIC ACID, VENOUS: 1.5 mmol/L (ref 0.5–2.0)

## 2015-12-29 MED ORDER — HYDROMORPHONE HCL 1 MG/ML IJ SOLN
1.0000 mg | Freq: Once | INTRAMUSCULAR | Status: AC
  Administered 2015-12-29: 1 mg via INTRAVENOUS
  Filled 2015-12-29: qty 1

## 2015-12-29 MED ORDER — SODIUM CHLORIDE 0.9 % IV SOLN
INTRAVENOUS | Status: DC
  Administered 2015-12-29: 23:00:00 via INTRAVENOUS

## 2015-12-29 MED ORDER — CIPROFLOXACIN HCL 500 MG PO TABS: 500.0000 mg | ORAL_TABLET | Freq: Two times a day (BID) | ORAL | Status: DC

## 2015-12-29 MED ORDER — ATORVASTATIN CALCIUM 40 MG PO TABS
40.0000 mg | ORAL_TABLET | Freq: Every day | ORAL | Status: DC
  Administered 2015-12-30 – 2016-01-03 (×5): 40 mg via ORAL
  Filled 2015-12-29 (×5): qty 1

## 2015-12-29 MED ORDER — INSULIN ASPART 100 UNIT/ML ~~LOC~~ SOLN
0.0000 [IU] | SUBCUTANEOUS | Status: DC
  Administered 2015-12-30: 2 [IU] via SUBCUTANEOUS

## 2015-12-29 MED ORDER — PREDNISONE 20 MG PO TABS
60.0000 mg | ORAL_TABLET | Freq: Once | ORAL | Status: AC
  Administered 2015-12-29: 60 mg via ORAL
  Filled 2015-12-29: qty 3

## 2015-12-29 MED ORDER — COLCHICINE 0.6 MG PO TABS
0.6000 mg | ORAL_TABLET | Freq: Two times a day (BID) | ORAL | Status: DC
  Administered 2015-12-29 – 2015-12-30 (×2): 0.6 mg via ORAL
  Filled 2015-12-29 (×2): qty 1

## 2015-12-29 MED ORDER — ASPIRIN EC 81 MG PO TBEC
81.0000 mg | DELAYED_RELEASE_TABLET | Freq: Every day | ORAL | Status: DC
  Administered 2015-12-30 – 2016-01-04 (×7): 81 mg via ORAL
  Filled 2015-12-29 (×6): qty 1

## 2015-12-29 MED ORDER — SODIUM CHLORIDE 0.9 % IV BOLUS (SEPSIS)
500.0000 mL | Freq: Once | INTRAVENOUS | Status: AC
  Administered 2015-12-29: 500 mL via INTRAVENOUS

## 2015-12-29 MED ORDER — METRONIDAZOLE IN NACL 5-0.79 MG/ML-% IV SOLN
500.0000 mg | Freq: Three times a day (TID) | INTRAVENOUS | Status: DC
  Administered 2015-12-29 – 2015-12-30 (×2): 500 mg via INTRAVENOUS
  Filled 2015-12-29 (×2): qty 100

## 2015-12-29 MED ORDER — ACETAMINOPHEN 650 MG RE SUPP: 650.0000 mg | Freq: Four times a day (QID) | RECTAL | Status: DC | PRN

## 2015-12-29 MED ORDER — ONDANSETRON HCL 4 MG PO TABS
4.0000 mg | ORAL_TABLET | Freq: Four times a day (QID) | ORAL | Status: DC | PRN
  Administered 2015-12-29: 4 mg via ORAL
  Filled 2015-12-29: qty 1

## 2015-12-29 MED ORDER — CIPROFLOXACIN HCL 250 MG PO TABS
500.0000 mg | ORAL_TABLET | Freq: Two times a day (BID) | ORAL | Status: DC
  Administered 2015-12-29: 500 mg via ORAL
  Filled 2015-12-29: qty 2

## 2015-12-29 MED ORDER — METRONIDAZOLE 500 MG PO TABS: 500.0000 mg | ORAL_TABLET | Freq: Three times a day (TID) | ORAL | Status: DC

## 2015-12-29 MED ORDER — INSULIN GLARGINE 100 UNIT/ML ~~LOC~~ SOLN
10.0000 [IU] | Freq: Every day | SUBCUTANEOUS | Status: DC
  Administered 2015-12-29 – 2016-01-01 (×4): 10 [IU] via SUBCUTANEOUS
  Filled 2015-12-29 (×5): qty 0.1

## 2015-12-29 MED ORDER — HYDRALAZINE HCL 25 MG PO TABS: 25.0000 mg | ORAL_TABLET | Freq: Three times a day (TID) | ORAL | Status: DC

## 2015-12-29 MED ORDER — CLOPIDOGREL BISULFATE 75 MG PO TABS
75.0000 mg | ORAL_TABLET | Freq: Every day | ORAL | Status: DC
Start: 2015-12-30 — End: 2016-01-04
  Administered 2015-12-30 – 2016-01-04 (×6): 75 mg via ORAL
  Filled 2015-12-29 (×6): qty 1

## 2015-12-29 MED ORDER — PANTOPRAZOLE SODIUM 40 MG PO TBEC
40.0000 mg | DELAYED_RELEASE_TABLET | Freq: Every day | ORAL | Status: DC
Start: 2015-12-30 — End: 2016-01-04
  Administered 2015-12-30 – 2016-01-04 (×6): 40 mg via ORAL
  Filled 2015-12-29 (×6): qty 1

## 2015-12-29 MED ORDER — SODIUM CHLORIDE 0.9% FLUSH
3.0000 mL | Freq: Two times a day (BID) | INTRAVENOUS | Status: DC
  Administered 2015-12-29 – 2016-01-04 (×10): 3 mL via INTRAVENOUS

## 2015-12-29 MED ORDER — ONDANSETRON HCL 4 MG/2ML IJ SOLN
4.0000 mg | Freq: Once | INTRAMUSCULAR | Status: AC
Start: 2015-12-29 — End: 2015-12-29
  Administered 2015-12-29: 4 mg via INTRAVENOUS
  Filled 2015-12-29: qty 2

## 2015-12-29 MED ORDER — ENOXAPARIN SODIUM 30 MG/0.3ML ~~LOC~~ SOLN: 30.0000 mg | SUBCUTANEOUS | Status: DC

## 2015-12-29 MED ORDER — ACETAMINOPHEN 325 MG PO TABS: 650.0000 mg | ORAL_TABLET | Freq: Four times a day (QID) | ORAL | Status: DC | PRN

## 2015-12-29 MED ORDER — HYDROCODONE-ACETAMINOPHEN 5-325 MG PO TABS
1.0000 | ORAL_TABLET | ORAL | Status: DC | PRN
  Administered 2015-12-29 – 2015-12-30 (×3): 2 via ORAL
  Filled 2015-12-29 (×3): qty 2

## 2015-12-29 MED ORDER — ONDANSETRON HCL 4 MG/2ML IJ SOLN: 4.0000 mg | Freq: Four times a day (QID) | INTRAMUSCULAR | Status: DC | PRN

## 2015-12-29 NOTE — H&P (Signed)
PCP:  Chevis Pretty, FNP    Referring provider Devona Konig PA   Chief Complaint:  Too weak to get up  HPI: Robin [name] is a 82 y.o. female   has a past medical history of Chronic renal insufficiency; Hypertension; Hypokalemia; DM (diabetes mellitus) ([place]); Myocardial infarction ([employer]); Shortness of breath; Cancer ([place]); Chronic diastolic heart failure ([place]); Valvular heart disease; Pulmonary hypertension ([place]); CAD (coronary artery disease); Carotid artery disease ([place]); HLD (hyperlipidemia); Atrial fibrillation ([place]); and Peripheral edema.   Presented with difficulty with ambulation Patient was recently admitted to [facility] from 19-24 of January for colitis she was admitted with profuse diarrhea fevers and abdominal pain. She was treated with IV Flagyl and Cipro for to have infectious colitis although at [place]. DIFFICILE COLITIS WAS SUSPECTED unfortunately sample did not make it to the lab. She improved on antibiotics and was discharged today. During her stay she developed gout of the left ankle her colchicine was restarted and she improved very soon thereafter. Per notes patient was anxious to be discharged home and was discharged this morning with her family. She had PT evaluation at the time of discharge and recommended no follow-up. Mobile to home patient stated she could not walk due to bilateral lower extremity pain. Family reports she still having diarrhea. Had a Bm on arrival to the house and was too weak for family to get her in the house and to have her be cleaned up. She has abdominal distention and nausea with vomiting.  She is Anemic denies nay blood in stool no melena.   Called 911 and was taken to [facility] ER.  In ER: Was noted to have elevated potassium up to 5.5 and creatinine of 2.9 from baseline of 2.4 white blood cell count has been trending down from 19 2 days ago to 16.6.  Significant past history including diabetes mellitus type 2 for which she is  controlled with Lantus, history of atrial fibrillation not on anticoagulation  She also has history of diastolic heart failure but that has been well controlled she has prescription for Zaroxolyn but does not need to use it   Hospitalist was called for admission for hyperkalemia and debility due to bilateral lower extremity pain  Review of Systems:    Pertinent positives include: Bilateral foot pain   Constitutional:  No weight loss, night sweats, Fevers, chills, fatigue, weight loss  HEENT:  No headaches, Difficulty swallowing,Tooth/dental problems,Sore throat,  No sneezing, itching, ear ache, nasal congestion, post nasal drip,  Cardio-vascular:  No chest pain, Orthopnea, PND, anasarca, dizziness, palpitations.no Bilateral lower extremity swelling  GI:  No heartburn, indigestion, abdominal pain, nausea, vomiting, diarrhea, change in bowel habits, loss of appetite, melena, blood in stool, hematemesis Resp:  no shortness of breath at rest. No dyspnea on exertion, No excess mucus, no productive cough, No non-productive cough, No coughing up of blood.No change in color of mucus.No wheezing. Skin:  no rash or lesions. No jaundice GU:  no dysuria, change in color of urine, no urgency or frequency. No straining to urinate.  No flank pain.  Musculoskeletal:  No joint pain or no joint swelling. No decreased range of motion. No back pain.  Psych:  No change in mood or affect. No depression or anxiety. No memory loss.  Neuro: no localizing neurological complaints, no tingling, no weakness, no double vision, no gait abnormality, no slurred speech, no confusion  Otherwise ROS are negative except for above, 10 systems were reviewed  Past  Medical History: Past Medical History  Diagnosis Date  . Chronic renal insufficiency   . Hypertension   . Hypokalemia   . DM (diabetes mellitus) ([place])   . Myocardial infarction [facility])     NSTEMI/DES LAD, 02/2010  . Shortness of breath   . Cancer ([place])      skin cancer  . Chronic diastolic heart failure (HCC)     EF 60-65%, echo, 02/2012  . Valvular heart disease     Moderate MR; severe TR, echo, 02/2012  . Pulmonary hypertension (HCC)     Severe (RVSP 63 mmHg), echo, 02/2012  . CAD (coronary artery disease)   . Carotid artery disease (HCC)     0000000 RICA; [id number] LICA, [id number]  . HLD (hyperlipidemia)   . Atrial fibrillation ([place])   . Peripheral edema    Past Surgical History  Procedure Laterality Date  . Total abdominal hysterectomy    . Cholecystectomy    . Cardiac catheterization      with stent 2011  . Coronary stent placement  2011  . Cataract extraction w/phaco  08/29/2011    Procedure: CATARACT EXTRACTION PHACO AND INTRAOCULAR LENS PLACEMENT (IOC);  Surgeon: Tonny Branch;  Location: [facility];  Service: Ophthalmology;  Laterality: Right;  CDE: 10.25  . Eye surgery  Sept. 2012    right KPE w/ IOL  . Cataract extraction w/phaco  09/08/2011    Procedure: CATARACT EXTRACTION PHACO AND INTRAOCULAR LENS PLACEMENT (IOC);  Surgeon: Tonny Branch;  Location: [facility];  Service: Ophthalmology;  Laterality: Left;  CDE: 10.77  . Skin cancer excision      multiple     Medications: Prior to Admission medications   Medication Sig Start Date End Date Taking? Authorizing Provider  aspirin EC 81 MG tablet Take 1 tablet (81 mg total) by mouth daily. 07/22/14  Yes Herminio Commons, MD  atorvastatin (LIPITOR) 40 MG tablet TAKE ONE (1) TABLET EACH DAY Patient taking differently: Take 40 mg by mouth daily at 6 PM. TAKE ONE (1) TABLET EACH DAY 12/04/15  Yes Mary-Margaret Hassell Done, FNP  cholecalciferol (VITAMIN D) 1000 UNITS tablet Take 1,000 Units by mouth daily.   Yes Historical Provider, MD  ciprofloxacin (CIPRO) 500 MG tablet Take 1 tablet (500 mg total) by mouth 2 (two) times daily. 12/29/15  Yes Orvan Falconer, MD  clopidogrel (PLAVIX) 75 MG tablet TAKE ONE (1) TABLET EACH DAY 12/04/15  Yes Mary-Margaret Hassell Done, FNP  COLCRYS 0.6 MG tablet Take 0.5 tablets (0.3 mg  total) by mouth daily. Patient taking differently: Take 0.3 mg by mouth every other day.  05/18/15  Yes Claretta Fraise, MD  feeding supplement, GLUCERNA SHAKE, (GLUCERNA SHAKE) LIQD Take 237 mLs by mouth 2 (two) times daily between meals. Patient taking differently: Take 237 mLs by mouth daily as needed (nutrional supplement).  08/23/15  Yes Debbe Odea, MD  fenofibrate micronized (LOFIBRA) 134 MG capsule Take 1 capsule (134 mg total) by mouth daily before breakfast. Patient taking differently: Take 134 mg by mouth at bedtime.  10/16/15  Yes Mary-Margaret Hassell Done, FNP  glimepiride (AMARYL) 4 MG tablet Take 0.5 tablets (2 mg total) by mouth daily. 12/04/15  Yes Mary-Margaret Hassell Done, FNP  hydrALAZINE (APRESOLINE) 25 MG tablet Take 1 tablet (25 mg total) by mouth every 8 (eight) hours. 12/29/15  Yes Orvan Falconer, MD  LANTUS SOLOSTAR 100 UNIT/ML Solostar Pen Inject 10 Units into the skin daily at 10 pm. 12/04/15  Yes Mary-Margaret Hassell Done, FNP  lisinopril (PRINIVIL,ZESTRIL) 20 MG tablet Take 20  mg by mouth 2 (two) times daily.   Yes Historical Provider, MD  metoCLOPramide (REGLAN) 10 MG tablet Take 5 mg by mouth daily as needed for nausea or vomiting. Reported on 12/04/2015 08/07/15  Yes Historical Provider, MD  metroNIDAZOLE (FLAGYL) 500 MG tablet Take 1 tablet (500 mg total) by mouth every 8 (eight) hours. 12/29/15  Yes Orvan Falconer, MD  nitroGLYCERIN (NITROSTAT) 0.4 MG SL tablet Place 1 tablet (0.4 mg total) under the tongue every 5 (five) minutes x 3 doses as needed. Patient taking differently: Place 0.4 mg under the tongue every 5 (five) minutes x 3 doses as needed for chest pain.  07/18/14  Yes Herminio Commons, MD  omeprazole (PRILOSEC) 40 MG capsule TAKE ONE (1) CAPSULE EACH DAY 11/12/15  Yes Mary-Margaret Hassell Done, FNP  potassium chloride (K-DUR) 10 MEQ tablet Take 1 tablet (10 mEq total) by mouth 2 (two) times daily. 12/04/15  Yes Mary-Margaret Hassell Done, FNP  torsemide (DEMADEX) 20 MG tablet Take 1 tablet (20 mg  total) by mouth 2 (two) times daily as needed. Swelling; Some days, daughter gives her 2 tablets every other day Patient taking differently: Take 20 mg by mouth 2 (two) times daily. Swelling; Some days, daughter gives her 2 tablets every other day 12/04/15  Yes Mary-Margaret Hassell Done, FNP  TRADJENTA 5 MG TABS tablet TAKE ONE (1) TABLET EACH DAY Patient taking differently: Take 5 mg by mouth daily at bedtime 10/09/15  Yes Mary-Margaret Hassell Done, FNP  UNIFINE PENTIPS 32G X 4 MM MISC EVERY DAY 02/23/15   Mary-Margaret Hassell Done, FNP    Allergies:  No Known Allergies  Social History:  Ambulatory   independently  Since discharge unable to bear weight on feet bilateraly Lives at home  With family   reports that she has never smoked. She has never used smokeless tobacco. She reports that she does not drink alcohol or use illicit drugs.     Family History: family history includes CAD in her mother; Cancer in her brother; Heart attack in her father. There is no history of Coronary artery disease, Anesthesia problems, Hypotension, Malignant hyperthermia, or Pseudochol deficiency.    Physical Exam: Patient Vitals for the past 24 hrs:  BP Temp Temp src Pulse Resp SpO2 Height Weight  12/29/15 1915 142/64 mmHg - - 70 - 98 % - -  12/29/15 1900 - - - - 16 - - -  12/29/15 1845 (!) 125/49 mmHg - - (!) 59 - 97 % - -  12/29/15 1837 (!) 129/51 mmHg - - 63 16 100 % - -  12/29/15 1815 137/74 mmHg - - 73 - 100 % - -  12/29/15 1730 (!) 124/47 mmHg - - 73 - 97 % - -  12/29/15 1715 (!) 142/42 mmHg - - 76 - 100 % - -  12/29/15 1630 (!) 139/46 mmHg - - 75 - 99 % - -  12/29/15 1615 111/55 mmHg - - 76 - 100 % - -  12/29/15 1545 (!) 168/46 mmHg - - 76 - 100 % - -  12/29/15 1533 184/60 mmHg 97.7 F (36.5 C) Oral 81 16 100 % 5\' 3"  (1.6 m) 52.164 kg (115 lb)    1. General:  in No Acute distress 2. Psychological: Alert and   Oriented 3. Head/ENT:    Dry Mucous Membranes                          Head Non traumatic, neck  supple  Poor Dentition 4. SKIN:  decreased Skin turgor,  Skin clean Dry and intact no rash 5. Heart: Regular rate and rhythm no Murmur, Rub or gallop 6. Lungs: Clear to auscultation bilaterally, no wheezes or crackles   7. Abdomen:  non-tender,   distended 8. Lower extremities: no clubbing, cyanosis, or edema 9. Neurologically Grossly intact, moving all 4 extremities equally 10. MSK: Normal range of motion  body mass index is 20.38 kg/(m^2).   Labs on Admission:   Results for orders placed or performed during the hospital encounter of 12/29/15 (from the past 24 hour(s))  Basic metabolic panel     Status: Abnormal   Collection Time: 12/29/15  5:30 PM  Result Value Ref Range   Sodium 133 (L) 135 - 145 mmol/L   Potassium 5.5 (H) 3.5 - 5.1 mmol/L   Chloride 106 101 - 111 mmol/L   CO2 16 (L) 22 - 32 mmol/L   Glucose, Bld 186 (H) 65 - 99 mg/dL   BUN 52 (H) 6 - 20 mg/dL   Creatinine, Ser 2.90 (H) 0.44 - 1.00 mg/dL   Calcium 8.8 (L) 8.9 - 10.3 mg/dL   GFR calc non Af Amer 14 (L) >60 mL/min   GFR calc Af Amer 16 (L) >60 mL/min   Anion gap 11 5 - 15  CBC     Status: Abnormal   Collection Time: 12/29/15  5:30 PM  Result Value Ref Range   WBC 16.6 (H) 4.0 - 10.5 K/uL   RBC 3.27 (L) 3.87 - 5.11 MIL/uL   Hemoglobin 8.3 (L) 12.0 - 15.0 g/dL   HCT 27.7 (L) 36.0 - 46.0 %   MCV 84.7 78.0 - 100.0 fL   MCH 25.4 (L) 26.0 - 34.0 pg   MCHC 30.0 30.0 - 36.0 g/dL   RDW 17.1 (H) 11.5 - 15.5 %   Platelets 419 (H) 150 - 400 K/uL    UA ordred  Lab Results  Component Value Date   HGBA1C 6.9* 12/24/2015    Estimated Creatinine Clearance: 11.7 mL/min (by C-G formula based on Cr of 2.9).  BNP (last 3 results) No results for input(s): PROBNP in the last 8760 hours.  Other results:  I have pearsonaly reviewed this: ECG REPORT  Rate:78  Rhythm: a.fib  ST&T Change: no evidence of ischemia QTC 623   Filed Weights   12/29/15 1533  Weight: 52.164 kg (115 lb)      Cultures:    Component Value Date/Time   SDES STOOL 12/26/2015 1417   SPECREQUEST NONE 12/26/2015 1417   CULT NO GROWTH 5 DAYS 12/24/2015 2105   REPTSTATUS 12/29/2015 FINAL 12/26/2015 1417     Radiological Exams on Admission: No results found.  Chart has been reviewed  Family  at  Bedside  plan of care was discussed with  Daughter Robin [name] [id number] home  [phone]  Assessment/Plan  82 year old female with history of chronic kidney disease, diabetes, coronary artery disease is just recently admitted for colitis was treated with Flagyl and Cipro while hospitalized develop was thought to be gout and was treated with colchicine discharged to home unable to tolerate weightbearing secondary to pain and was brought back was found to have worsening renal function  and hyperkalemia  Present on Admission:  . Acute on chronic kidney failure ([place]) - possibly secondary to decreased by mouth intake. We'll hold for tonight torsemide and give gentle fluids, stop lisinopril  . Anemia in CKD (chronic kidney disease) - an anemia panel and Hemoccult stool  .  Atrial fibrillation ([place]) - patient on Plavix not on anticoagulation will need to clarify this any evidence of blood loss and defers any contraindication for anticoagulation. CHA2D Vasc score 7 . Chronic diastolic heart failure (HCC) - despite trace edema of the feet bilaterally which could be secondary to gout overall appears to be fluid down with increasing renal insufficiency we'll hold torsemide for now will need to restart still with fluid overload  . Colitis - we'll test for C. difficile. Given prolongation of QTC hold off on Cipro continue Flagyl for now and reassess . CORONARY ATHEROSCLEROSIS NATIVE CORONARY ARTERY is stable continue home medications  . Diabetes mellitus type 2 with complications ([place]) - hold by mouth medications continue sliding scale insulin  . Essential hypertension continue home medications hold the Cipro   . Hyperkalemia stop lisinopril and Potassium administer gentle fluids and recheck potassium no EKG changes noted  . Debility will need PT OT evaluation benefit from rehabilitation placement  Gout  - we'll restart colchicine as assessing to have helped her holding off on prednisone for now given colitis  . Dehydration hold torsemide give gentle fluids  . Prolonged QT interval PD EKG in the morning hold Cipro   Prophylaxis:  SCD  CODE STATUS:  FULL CODE  as per patient    Disposition:  likely will need placement for rehabilitation                      Other plan as per orders.  I have spent a total of 65min on this admission  Maleko Greulich 12/29/2015, 10:36 PM    [facility]  Pager [phone]   after 2 AM please page floor coverage PA If 7AM-7PM, please contact the day team taking care of the patient  [email or web address]  Password [email or web address]

## 2015-12-29 NOTE — ED Notes (Signed)
RN attempt to call report to floor; RN to call back  

## 2015-12-29 NOTE — Care Management Note (Signed)
Case Management Note  Patient Details  Name: Robin [name] MRN: [id number] Date of Birth: 04-21-1928  Expected Discharge Date:  12/26/15               Expected Discharge Plan:  Home/Self Care  In-House Referral:  NA  Discharge planning Services  CM Consult  Post Acute Care Choice:  NA Choice offered to:  NA  DME Arranged:    DME Agency:     HH Arranged:    Kay Agency:     Status of Service:  Completed, signed off  Medicare Important Message Given:  Yes Date Medicare IM Given:    Medicare IM give by:    Date Additional Medicare IM Given:    Additional Medicare Important Message give by:     If discussed at [place] of Stay Meetings, dates discussed:  12/29/2015  Additional Comments: Pt discharging home today with self care. PT has evaluated pt and recommends no f/u. No CM needs at the time of DC.   Sherald Barge, RN 12/29/2015, 11:09 AM

## 2015-12-29 NOTE — Progress Notes (Signed)
Inpatient Diabetes Program Recommendations  AACE/ADA: New Consensus Statement on Inpatient Glycemic Control (2015)  Target Ranges:  Prepandial:   less than 140 mg/dL      Peak postprandial:   less than 180 mg/dL (1-2 hours)      Critically ill patients:  140 - 180 mg/dL  Results for Robin, [name] (MRN [id number]) as of 12/29/2015 10:17  Ref. Range 12/28/2015 07:26 12/28/2015 08:32 12/28/2015 11:40 12/28/2015 16:27 12/28/2015 21:03 12/28/2015 22:13 12/29/2015 07:28  Glucose-Capillary Latest Ref Range: 65-99 mg/dL 60 (L) 123 (H) 153 (H) 92 57 (L) 133 (H) 123 (H)   Review of Glycemic Control  Diabetes history: DM2 Outpatient Diabetes medications: Amaryl 2 mg daily, Tradjenta 5 mg daily, Lantus 10 units QHS Current orders for Inpatient glycemic control: Novolog 0-9 units TID with meals  Inpatient Diabetes Program Recommendations: Correction (SSI): If patient is not discharged today, please consider ordering Novolog 0-5 units TID with meals (recommend creating custom correction scale that begins at 200 mg/dL with 200-249 mg/dl =1 unit, 250-300 = 2 units, 301-350 = 3 units, 351-400 = 4 units,  greater than 400 = 5 units and call MD).  Note: Called patient over the phone to discuss diabetes and outpatient regimen for diabetes control.  Patient reports that she is followed by Ronnald Collum, FNP for diabetes management and currently she takes "pills and insulin" as an outpatient for diabetes control.  Patient states that her daughter prepares all of her medications so she is not sure exactly what she takes for DM. In reviewing the chart, noted patient was on Lantus in the past and it was discontinued at time of discharge from the hospital on 08/23/2015. Patient was restarted back on Lantus at a lower dose of 10 units on 12/04/15 according to office note by Ronnald Collum, FNP. Inquired about taking insulin and patient states that she "takes the insulin most of the time, when we don't forget."  Inquired about glycemic  control with the insulin along with oral DM medications and patient states "my sugar runs pretty good, in the 100's mg/dl."  Patient reports that she is going to be discharged today. Encouraged patient to reach out to Ronnald Collum, FNP if she notes that she is having any issues with hypoglycemia. Encouraged patient to check glucose 3-4 times per day and to keep a log of glucose readings which she should take with her to her follow up appointments.  Patient verbalized understanding of information discussed and she states that she has no further questions at this time related to diabetes.   Thanks, Barnie Alderman, RN, MSN, CDE Diabetes Coordinator Inpatient Diabetes Program [phone] (Team Pager) [phone] ([facility] office) [phone] [facility] office) [phone] [facility] office)

## 2015-12-29 NOTE — ED Notes (Signed)
Patient transported to X-ray 

## 2015-12-29 NOTE — ED Notes (Signed)
Pt is vomiting. Dorothea Ogle, [place] informed.

## 2015-12-29 NOTE — Care Management Important Message (Signed)
Important Message  Patient Details  Name: Robin [name] MRN: [id number] Date of Birth: 05/04/1928   Medicare Important Message Given:  Yes    Sherald Barge, RN 12/29/2015, 11:05 AM

## 2015-12-29 NOTE — Discharge Summary (Signed)
Physician Discharge Summary  Robin [name] [id number] DOB: Sep 14, 1928 DOA: 12/24/2015  PCP: Chevis Pretty, FNP  Admit date: 12/24/2015 Discharge date: 12/29/2015  Time spent: 35 minutes  Recommendations for Outpatient Follow-up:  1. Follow up with your PCP in one week.   Discharge Diagnoses:  Principal Problem:   Colitis Active Problems:   CKD (chronic kidney disease), stage IV (HCC)   Diabetes mellitus type 2 with complications ([place])   Anemia in CKD (chronic kidney disease)   Acute on chronic kidney failure ([place])   Discharge Condition: Improved.  Tolerated food.  No diarrhea, No foot pain, and no abdominal pain.   Diet recommendation: carb modified diet.   Filed Weights   12/24/15 1847  Weight: 52.164 kg (115 lb)    History of present illness: patient was admitted for colitis on Dec 24, 2015 by Dr Nicki Guadalajara.  As per his prior H and P:  " Robin [name] is a 82 y.o. female with a past medical history of for diabetes on insulin, coronary artery disease, gout, paroxysmal atrial fibrillation not on anticoagulation, who was in her usual state of health a few days ago when she started having abdominal pain, diarrhea. She's had innumerable number of bowel movements. Most of this has been brown in color. Some of them have been watery. Denies any blood in the stool. Denies any sick contacts. No recent travel. No recent antibiotic use. She ate out at a restaurant on Sunday. However, her daughter also ate there, but she didn't fall sick. She also has abdominal pain in the lower abdomen, left more than right. It is 8 out of 10 in intensity, achy pain without radiation. Some nausea but no vomiting. Has had fever along with chills. Never had a colonoscopy.   Hospital Course: Patient was admitted into the hospital, and she was stared on IV Flagyl and IV cipro.  It was not certain if she has infectious or ischemic colitis.  I think that is was likely to be infectious colitis.  Given her  significant leukocytosis, it was very suspicious that she may have had C diff colitis.  She was placed on contact precaution.  Unfortunately, though ordered was placed, and stool was collected, it somehow never made it to the lab.  In any event, she improved markedly with IV antibiotics, and her leukocytosis improved as well.  She was hypertensive, and Hydralazine was added to her regimen.  Since she had CKD, and her Cr was originally elevated, it was held at this time.  She developed gouty attack on left ankle, and she was given back her colchicine at 0.6mg  BID.  She improved immediately.  She will be discharged on her home dose of 0.3mg  BID.  She will continue with Cipro and Flagyl for another 5 days.  She is anxious to go home and will be discharged today.  She will follow up with her PCP next week.  Thank you so much for allowing me to participate in her care.  Good day.    Discharge Exam: Filed Vitals:   12/28/15 2121 12/29/15 0502  BP: 132/47 119/51  Pulse: 73 72  Temp: 100.5 F (38.1 C) 98.7 F (37.1 C)  Resp: 16 18   Discharge Instructions   Discharge Instructions    Diet - low sodium heart healthy    Complete by:  As directed      Discharge instructions    Complete by:  As directed   Take your medicine as directed until changed  by your PCP.  See your doctor next week.     Increase activity slowly    Complete by:  As directed           Current Discharge Medication List    START taking these medications   Details  hydrALAZINE (APRESOLINE) 25 MG tablet Take 1 tablet (25 mg total) by mouth every 8 (eight) hours. Qty: 90 tablet, Refills: 1      CONTINUE these medications which have NOT CHANGED   Details  aspirin EC 81 MG tablet Take 1 tablet (81 mg total) by mouth daily.    atorvastatin (LIPITOR) 40 MG tablet TAKE ONE (1) TABLET EACH DAY Qty: 30 tablet, Refills: 2   Associated Diagnoses: Hyperlipemia    cholecalciferol (VITAMIN D) 1000 UNITS tablet Take 1,000 Units by  mouth daily.    clopidogrel (PLAVIX) 75 MG tablet TAKE ONE (1) TABLET EACH DAY Qty: 30 tablet, Refills: 5   Associated Diagnoses: Paroxysmal atrial fibrillation (HCC)    COLCRYS 0.6 MG tablet Take 0.5 tablets (0.3 mg total) by mouth daily. Qty: 30 tablet, Refills: 2    feeding supplement, GLUCERNA SHAKE, (GLUCERNA SHAKE) LIQD Take 237 mLs by mouth 2 (two) times daily between meals. Qty: 30 Can, Refills: 0    fenofibrate micronized (LOFIBRA) 134 MG capsule Take 1 capsule (134 mg total) by mouth daily before breakfast. Qty: 30 capsule, Refills: 5   Associated Diagnoses: Hyperlipemia    glimepiride (AMARYL) 4 MG tablet Take 0.5 tablets (2 mg total) by mouth daily. Qty: 30 tablet, Refills: 5   Associated Diagnoses: Type 2 diabetes mellitus with complication, without long-term current use of insulin (HCC)    LANTUS SOLOSTAR 100 UNIT/ML Solostar Pen Inject 10 Units into the skin daily at 10 pm. Qty: 15 mL, Refills: 5   Associated Diagnoses: Type 2 diabetes mellitus with complication, without long-term current use of insulin (HCC)    omeprazole (PRILOSEC) 40 MG capsule TAKE ONE (1) CAPSULE EACH DAY Qty: 30 capsule, Refills: 2    potassium chloride (K-DUR) 10 MEQ tablet Take 1 tablet (10 mEq total) by mouth 2 (two) times daily. Qty: 30 tablet, Refills: 5   Associated Diagnoses: Hypokalemia    torsemide (DEMADEX) 20 MG tablet Take 1 tablet (20 mg total) by mouth 2 (two) times daily as needed. Swelling; Some days, daughter gives her 2 tablets every other day Qty: 30 tablet, Refills: 5   Associated Diagnoses: Peripheral edema    TRADJENTA 5 MG TABS tablet TAKE ONE (1) TABLET EACH DAY Qty: 30 tablet, Refills: 5    metoCLOPramide (REGLAN) 10 MG tablet Take 5 mg by mouth daily as needed for nausea or vomiting. Reported on 12/04/2015    nitroGLYCERIN (NITROSTAT) 0.4 MG SL tablet Place 1 tablet (0.4 mg total) under the tongue every 5 (five) minutes x 3 doses as needed. Qty: 25 tablet,  Refills: 3    UNIFINE PENTIPS 32G X 4 MM MISC EVERY DAY Qty: 100 each, Refills: 2      STOP taking these medications     lisinopril (PRINIVIL,ZESTRIL) 20 MG tablet      ondansetron (ZOFRAN) 4 MG tablet        No Known Allergies    The results of significant diagnostics from this hospitalization (including imaging, microbiology, ancillary and laboratory) are listed below for reference.    Significant Diagnostic Studies: Ct Abdomen Pelvis Wo Contrast  12/24/2015  CLINICAL DATA:  82 year old female with abdominal pain, diarrhea and fever for 3 days. Patient  with chronic renal failure. EXAM: CT ABDOMEN AND PELVIS WITHOUT CONTRAST TECHNIQUE: Multidetector CT imaging of the abdomen and pelvis was performed following the standard protocol without IV contrast. COMPARISON:  02/01/2010 and 10/25/2010 ultrasound. FINDINGS: Please note that parenchymal abnormalities may be missed without intravenous contrast. Lower chest: A trace right pleural effusion is noted. Cardiomegaly noted. Hepatobiliary: A 1.1 x 1.4 cm fat containing lesion within the right liver is unchanged and compatible with a benign fatty lesion. The patient is status post cholecystectomy. There is no evidence of intrahepatic biliary dilatation. Pancreas: Unremarkable Spleen: Unremarkable Adrenals/Urinary Tract: Mild bilateral renal cortical atrophy noted. A probable cysts within the left lower pole identified. The adrenal glands and bladder are unremarkable. Stomach/Bowel: There is circumferential wall thickening of the descending colon compatible with colitis. There is equivocal wall thickening of portions of the transverse colon. There is no evidence of bowel obstruction, pneumatosis, pneumoperitoneum or focal collection/abscess. Vascular/Lymphatic: Heavy atherosclerotic calcifications of the aorta, mesenteric arteries and origins are the renal arteries noted. There is no evidence of aortic aneurysm. No enlarged lymph nodes are identified.  Reproductive: Patient is status post hysterectomy. There is no evidence of adnexal mass. No free fluid identified. Other: No acute or suspicious abnormality. Musculoskeletal: No acute or suspicious abnormalities are identified. Moderate degenerative changes of the lumbar spine noted. IMPRESSION: Colitis involving the descending colon with equivocal involvement of the transverse colon. Differential includes infectious, inflammatory or ischemic colitis given heavy atherosclerotic calcification of the mesenteric arteries. No evidence of bowel obstruction, pneumoperitoneum, pneumatosis or abscess. Trace right pleural effusion. Cardiomegaly. Electronically Signed   By: Margarette Canada M.D.   On: 12/24/2015 20:29    Microbiology: Recent Results (from the past 240 hour(s))  Blood culture (routine x 2)     Status: None   Collection Time: 12/24/15  8:43 PM  Result Value Ref Range Status   Specimen Description BLOOD RIGHT ANTECUBITAL  Final   Special Requests BOTTLES DRAWN AEROBIC AND ANAEROBIC 6CC  Final   Culture NO GROWTH 5 DAYS  Final   Report Status 12/29/2015 FINAL  Final  Blood culture (routine x 2)     Status: None   Collection Time: 12/24/15  9:05 PM  Result Value Ref Range Status   Specimen Description BLOOD RIGHT ANTECUBITAL  Final   Special Requests BOTTLES DRAWN AEROBIC AND ANAEROBIC [facility]  Final   Culture NO GROWTH 5 DAYS  Final   Report Status 12/29/2015 FINAL  Final     Labs: Basic Metabolic Panel:  Recent Labs Lab 12/24/15 1927 12/25/15 0608 12/26/15 0559 12/27/15 0825 12/28/15 0850  NA 139 139 139 137 135  K 4.2 4.0 3.4* 3.8 4.4  CL 103 109 112* 110 109  CO2 21* 21* 21* 19* 18*  GLUCOSE 159* 104* 102* 96 173*  BUN 58* 55* 45* 41* 44*  CREATININE 3.10* 2.64* 2.32* 2.45* 2.65*  CALCIUM 8.9 8.1* 7.9* 8.1* 7.9*   Liver Function Tests:  Recent Labs Lab 12/24/15 1927 12/25/15 0608 12/27/15 0825  AST 42* 33 30  ALT 18 15 15   ALKPHOS 69 55 60  BILITOT 0.9 0.7 0.6  PROT  6.7 5.3* 5.2*  ALBUMIN 2.7* 2.1* 2.0*    Recent Labs Lab 12/24/15 1927  LIPASE 35   CBC:  Recent Labs Lab 12/24/15 1927 12/25/15 0608 12/26/15 0559 12/27/15 0825 12/28/15 0850  WBC 24.0* 22.0* 20.0* 19.2* 14.9*  NEUTROABS 20.3*  --   --  16.1* 11.1*  HGB 9.2* 7.8* 7.8* 8.2* 7.7*  HCT 29.5* 25.0* 25.2* 26.6* 24.6*  MCV 87.5 87.1 87.2 87.2 86.3  PLT 385 324 320 361 327   CBG:  Recent Labs Lab 12/28/15 1140 12/28/15 1627 12/28/15 2103 12/28/15 2213 12/29/15 0728  GLUCAP 153* 92 57* 133* 123*    Signed:  Kmarion Rawl MD.  [facility] 12/29/2015, 10:51 AM

## 2015-12-29 NOTE — ED Notes (Signed)
Pt to get bilateral ankle Xray before moving to floor;

## 2015-12-29 NOTE — Progress Notes (Signed)
Patient states understanding of discharge instructions.  

## 2015-12-29 NOTE — ED Provider Notes (Signed)
  Physical Exam  BP 184/60 mmHg  Pulse 81  Temp(Src) 97.7 F (36.5 C) (Oral)  Resp 16  Ht 5\' 3"  (1.6 m)  Wt 52.164 kg  BMI 20.38 kg/m2  SpO2 100%  Physical Exam  ED Course  Procedures  MDM Waiting for results from CBC, BMET Contact Medicine for admission for pain control once results return   5:11 PM Sign Out received from Virginia Crews, MD 6:50 PM- Spoke with Dr. Verlon Au. Medicine will come see for potential admission  Given NS Bolus 500.  EKG, Repeat Potassium, Cardiac Monitoring ordered  Admit    Shary Decamp, PA-C 12/29/15 2009  Fredia Sorrow, MD 12/29/15 2019

## 2015-12-29 NOTE — ED Provider Notes (Signed)
CSN: [id number]     Arrival date & time 12/29/15  1523 History   First MD Initiated Contact with Patient 12/29/15 1553     Chief Complaint  Patient presents with  . Foot Pain     (Consider location/radiation/quality/duration/timing/severity/associated sxs/prior Treatment) HPI Comments: Robin [name] is a 82 y.o. F with history of gout, CKD4, T2DM, HLD, HFpEF presenting to ED for evaluation of gout flare.  Patient was admitted at [facility] for infectious colitis from 1/19-1/24.  She reported bilateral foot pain on 1/23 and told it was a gout flare.  Pain worsened through this morning, but she was discharged.  She got home a few hours ago and was unable to tolerate pain or move around 2/2 pain.  Her daughter then called EMS to bring her to hospital for further management.  Patient reports that abd pain from colitis has resolved, as have fevers.  She still has intermittent loose stools. She was sent home to finish course of Cipro/Flagyl.  From EMR review, they report CDiff PCR was never run.  Patient was given 4mg  IM Morphine en route, with minimal improvement in pain. Pain is worst in L ankle, but also involves b/l great toes and heels.    Patient is a 82 y.o. female presenting with lower extremity pain.  Foot Pain Associated symptoms include arthralgias and joint swelling. Pertinent negatives include no abdominal pain, chest pain, chills or fever.    Past Medical History  Diagnosis Date  . Chronic renal insufficiency   . Hypertension   . Hypokalemia   . DM (diabetes mellitus) ([place])   . Myocardial infarction [facility])     NSTEMI/DES LAD, 02/2010  . Shortness of breath   . Cancer ([place])     skin cancer  . Chronic diastolic heart failure (HCC)     EF 60-65%, echo, 02/2012  . Valvular heart disease     Moderate MR; severe TR, echo, 02/2012  . Pulmonary hypertension (HCC)     Severe (RVSP 63 mmHg), echo, 02/2012  . CAD (coronary artery disease)   . Carotid artery disease (HCC)     0000000 RICA;  [id number] LICA, [id number]  . HLD (hyperlipidemia)   . Atrial fibrillation ([place])   . Peripheral edema    Past Surgical History  Procedure Laterality Date  . Total abdominal hysterectomy    . Cholecystectomy    . Cardiac catheterization      with stent 2011  . Coronary stent placement  2011  . Cataract extraction w/phaco  08/29/2011    Procedure: CATARACT EXTRACTION PHACO AND INTRAOCULAR LENS PLACEMENT (IOC);  Surgeon: Tonny Branch;  Location: [facility];  Service: Ophthalmology;  Laterality: Right;  CDE: 10.25  . Eye surgery  Sept. 2012    right KPE w/ IOL  . Cataract extraction w/phaco  09/08/2011    Procedure: CATARACT EXTRACTION PHACO AND INTRAOCULAR LENS PLACEMENT (IOC);  Surgeon: Tonny Branch;  Location: [facility];  Service: Ophthalmology;  Laterality: Left;  CDE: 10.77  . Skin cancer excision      multiple   Family History  Problem Relation Age of Onset  . Coronary artery disease Neg Hx   . Anesthesia problems Neg Hx   . Hypotension Neg Hx   . Malignant hyperthermia Neg Hx   . Pseudochol deficiency Neg Hx   . CAD Mother   . Heart attack Father   . Cancer Brother     colon   Social History  Substance Use Topics  . Smoking  status: Never Smoker   . Smokeless tobacco: Never Used  . Alcohol Use: No   OB History    No data available     Review of Systems  Constitutional: Negative for fever and chills.  Respiratory: Negative for shortness of breath.   Cardiovascular: Negative for chest pain and leg swelling.  Gastrointestinal: Positive for diarrhea. Negative for abdominal pain.  Musculoskeletal: Positive for joint swelling and arthralgias.  All other systems reviewed and are negative.     Allergies  Review of patient's allergies indicates no known allergies.  Home Medications   Prior to Admission medications   Medication Sig Start Date End Date Taking? Authorizing Provider  aspirin EC 81 MG tablet Take 1 tablet (81 mg total) by mouth daily. 07/22/14  Yes Herminio Commons, MD   atorvastatin (LIPITOR) 40 MG tablet TAKE ONE (1) TABLET EACH DAY Patient taking differently: Take 40 mg by mouth daily at 6 PM. TAKE ONE (1) TABLET EACH DAY 12/04/15  Yes Mary-Margaret Hassell Done, FNP  cholecalciferol (VITAMIN D) 1000 UNITS tablet Take 1,000 Units by mouth daily.   Yes Historical Provider, MD  ciprofloxacin (CIPRO) 500 MG tablet Take 1 tablet (500 mg total) by mouth 2 (two) times daily. 12/29/15  Yes Orvan Falconer, MD  clopidogrel (PLAVIX) 75 MG tablet TAKE ONE (1) TABLET EACH DAY 12/04/15  Yes Mary-Margaret Hassell Done, FNP  COLCRYS 0.6 MG tablet Take 0.5 tablets (0.3 mg total) by mouth daily. Patient taking differently: Take 0.3 mg by mouth every other day.  05/18/15  Yes Claretta Fraise, MD  feeding supplement, GLUCERNA SHAKE, (GLUCERNA SHAKE) LIQD Take 237 mLs by mouth 2 (two) times daily between meals. Patient taking differently: Take 237 mLs by mouth daily as needed (nutrional supplement).  08/23/15  Yes Debbe Odea, MD  fenofibrate micronized (LOFIBRA) 134 MG capsule Take 1 capsule (134 mg total) by mouth daily before breakfast. Patient taking differently: Take 134 mg by mouth at bedtime.  10/16/15  Yes Mary-Margaret Hassell Done, FNP  glimepiride (AMARYL) 4 MG tablet Take 0.5 tablets (2 mg total) by mouth daily. 12/04/15  Yes Mary-Margaret Hassell Done, FNP  hydrALAZINE (APRESOLINE) 25 MG tablet Take 1 tablet (25 mg total) by mouth every 8 (eight) hours. 12/29/15  Yes Orvan Falconer, MD  LANTUS SOLOSTAR 100 UNIT/ML Solostar Pen Inject 10 Units into the skin daily at 10 pm. 12/04/15  Yes Mary-Margaret Hassell Done, FNP  lisinopril (PRINIVIL,ZESTRIL) 20 MG tablet Take 20 mg by mouth 2 (two) times daily.   Yes Historical Provider, MD  metoCLOPramide (REGLAN) 10 MG tablet Take 5 mg by mouth daily as needed for nausea or vomiting. Reported on 12/04/2015 08/07/15  Yes Historical Provider, MD  metroNIDAZOLE (FLAGYL) 500 MG tablet Take 1 tablet (500 mg total) by mouth every 8 (eight) hours. 12/29/15  Yes Orvan Falconer, MD   nitroGLYCERIN (NITROSTAT) 0.4 MG SL tablet Place 1 tablet (0.4 mg total) under the tongue every 5 (five) minutes x 3 doses as needed. Patient taking differently: Place 0.4 mg under the tongue every 5 (five) minutes x 3 doses as needed for chest pain.  07/18/14  Yes Herminio Commons, MD  omeprazole (PRILOSEC) 40 MG capsule TAKE ONE (1) CAPSULE EACH DAY 11/12/15  Yes Mary-Margaret Hassell Done, FNP  potassium chloride (K-DUR) 10 MEQ tablet Take 1 tablet (10 mEq total) by mouth 2 (two) times daily. 12/04/15  Yes Mary-Margaret Hassell Done, FNP  torsemide (DEMADEX) 20 MG tablet Take 1 tablet (20 mg total) by mouth 2 (two) times daily as needed. Swelling;  Some days, daughter gives her 2 tablets every other day Patient taking differently: Take 20 mg by mouth 2 (two) times daily. Swelling; Some days, daughter gives her 2 tablets every other day 12/04/15  Yes Mary-Margaret Hassell Done, FNP  TRADJENTA 5 MG TABS tablet TAKE ONE (1) TABLET EACH DAY Patient taking differently: Take 5 mg by mouth daily at bedtime 10/09/15  Yes Mary-Margaret Hassell Done, FNP  UNIFINE PENTIPS 32G X 4 MM MISC EVERY DAY 02/23/15   Mary-Margaret Hassell Done, FNP   BP 184/60 mmHg  Pulse 81  Temp(Src) 97.7 F (36.5 C) (Oral)  Resp 16  Ht 5\' 3"  (1.6 m)  Wt 52.164 kg  BMI 20.38 kg/m2  SpO2 100% Physical Exam  Constitutional: She is oriented to person, place, and time. She appears well-developed and well-nourished. No distress.  HENT:  Head: Normocephalic and atraumatic.  Mouth/Throat: Oropharynx is clear and moist. No oropharyngeal exudate.  Eyes: Conjunctivae and EOM are normal. Pupils are equal, round, and reactive to light.  Neck: Neck supple.  Cardiovascular: Normal rate, regular rhythm and normal heart sounds.   No murmur heard. Pulmonary/Chest: Effort normal and breath sounds normal. No respiratory distress. She has no wheezes. She has no rales.  Abdominal: Soft. Bowel sounds are normal. She exhibits no distension. There is no tenderness. There  is no rebound and no guarding.  Musculoskeletal:  Bilateral feet swollen and TTP Erythema over L medial malleolus and bilateral great toes with some confluence over forefoot. Diffusely TTP, but worst over L ankle, b/l heels, and b/l great toes  Lymphadenopathy:    She has no cervical adenopathy.  Neurological: She is alert and oriented to person, place, and time.  Skin: Skin is warm and dry. No rash noted.    ED Course  Procedures (including critical care time) Labs Review Labs Reviewed  BASIC METABOLIC PANEL  CBC    Imaging Review No results found. I have personally reviewed and evaluated these images and lab results as part of my medical decision-making.   EKG Interpretation None      MDM   Final diagnoses:  Acute gout of multiple sites, unspecified cause    82 y.o. F with h/o gout presenting with bilateral foot pain, erythema.  She was just discharged from hospital this AM, but was unable to walk at home due to pain.  She has required 4mg  IM Morphine en route and given another 1mg  Dilaudid now.  She was also given prednisone dose.  Patient will require admission for pain control and because she is unable to ambulate.  Labs pending, but BMET and CBC from yesterday stable.  Spoke with [facility], who will admit after labs result.    Virginia Crews, MD 12/29/15 [place], MD 12/29/15 2020

## 2015-12-29 NOTE — ED Notes (Signed)
Pt sent from home for Bilateral foot pain with redness in left heel, left great toe and right great toe. Pt states hx of gout. Pt just d/c'ed today from [facility] with an admission for colitis and possible c-diff infection. Pt states when she got home she was unable to walk. EMS gave 4mg  morphine IM.

## 2015-12-29 NOTE — ED Notes (Signed)
Lab to add on Potassium level per phone call with RN

## 2015-12-30 ENCOUNTER — Ambulatory Visit (HOSPITAL_COMMUNITY): Payer: Medicare Other

## 2015-12-30 ENCOUNTER — Other Ambulatory Visit: Payer: Self-pay

## 2015-12-30 DIAGNOSIS — N189 Chronic kidney disease, unspecified: Secondary | ICD-10-CM

## 2015-12-30 DIAGNOSIS — R609 Edema, unspecified: Secondary | ICD-10-CM

## 2015-12-30 DIAGNOSIS — E118 Type 2 diabetes mellitus with unspecified complications: Secondary | ICD-10-CM

## 2015-12-30 DIAGNOSIS — E860 Dehydration: Secondary | ICD-10-CM

## 2015-12-30 DIAGNOSIS — D631 Anemia in chronic kidney disease: Secondary | ICD-10-CM

## 2015-12-30 DIAGNOSIS — M7989 Other specified soft tissue disorders: Secondary | ICD-10-CM

## 2015-12-30 DIAGNOSIS — Z794 Long term (current) use of insulin: Secondary | ICD-10-CM

## 2015-12-30 DIAGNOSIS — N179 Acute kidney failure, unspecified: Principal | ICD-10-CM

## 2015-12-30 DIAGNOSIS — I480 Paroxysmal atrial fibrillation: Secondary | ICD-10-CM

## 2015-12-30 LAB — TSH: TSH: 0.901 u[IU]/mL (ref 0.350–4.500)

## 2015-12-30 LAB — GLUCOSE, CAPILLARY
GLUCOSE-CAPILLARY: 174 mg/dL — AB (ref 65–99)
GLUCOSE-CAPILLARY: 236 mg/dL — AB (ref 65–99)
GLUCOSE-CAPILLARY: 211 mg/dL — AB (ref 65–99)
Glucose-Capillary: 163 mg/dL — ABNORMAL HIGH (ref 65–99)
Glucose-Capillary: 207 mg/dL — ABNORMAL HIGH (ref 65–99)

## 2015-12-30 LAB — CBC
HCT: 24.7 % — ABNORMAL LOW (ref 36.0–46.0)
HEMOGLOBIN: 7.6 g/dL — AB (ref 12.0–15.0)
MCH: 26.1 pg (ref 26.0–34.0)
MCHC: 30.8 g/dL (ref 30.0–36.0)
MCV: 84.9 fL (ref 78.0–100.0)
Platelets: 411 10*3/uL — ABNORMAL HIGH (ref 150–400)
RBC: 2.91 MIL/uL — ABNORMAL LOW (ref 3.87–5.11)
RDW: 17.1 % — AB (ref 11.5–15.5)
WBC: 10.4 10*3/uL (ref 4.0–10.5)

## 2015-12-30 LAB — PHOSPHORUS: PHOSPHORUS: 5.5 mg/dL — AB (ref 2.5–4.6)

## 2015-12-30 LAB — MAGNESIUM: MAGNESIUM: 1.8 mg/dL (ref 1.7–2.4)

## 2015-12-30 LAB — RETICULOCYTES
RBC.: 2.91 MIL/uL — ABNORMAL LOW (ref 3.87–5.11)
RETIC COUNT ABSOLUTE: 64 10*3/uL (ref 19.0–186.0)
RETIC CT PCT: 2.2 % (ref 0.4–3.1)

## 2015-12-30 LAB — FOLATE: Folate: 7.5 ng/mL (ref >5.9)

## 2015-12-30 LAB — FERRITIN: Ferritin: 66 ng/mL (ref 11–307)

## 2015-12-30 LAB — CREATININE, URINE, RANDOM: CREATININE, URINE: 97.47 mg/dL

## 2015-12-30 LAB — COMPREHENSIVE METABOLIC PANEL
ALBUMIN: 1.6 g/dL — AB (ref 3.5–5.0)
ALK PHOS: 68 U/L (ref 38–126)
ALT: 16 U/L (ref 14–54)
ANION GAP: 9 (ref 5–15)
AST: 36 U/L (ref 15–41)
BILIRUBIN TOTAL: 0.6 mg/dL (ref 0.3–1.2)
BUN: 58 mg/dL — AB (ref 6–20)
CALCIUM: 8.2 mg/dL — AB (ref 8.9–10.3)
CO2: 16 mmol/L — ABNORMAL LOW (ref 22–32)
Chloride: 107 mmol/L (ref 101–111)
Creatinine, Ser: 2.82 mg/dL — ABNORMAL HIGH (ref 0.44–1.00)
GFR calc Af Amer: 17 mL/min — ABNORMAL LOW (ref >60)
GFR, EST NON AFRICAN AMERICAN: 14 mL/min — AB (ref >60)
GLUCOSE: 191 mg/dL — AB (ref 65–99)
Potassium: 5 mmol/L (ref 3.5–5.1)
Sodium: 132 mmol/L — ABNORMAL LOW (ref 135–145)
TOTAL PROTEIN: 5 g/dL — AB (ref 6.5–8.1)

## 2015-12-30 LAB — IRON AND TIBC
IRON: 8 ug/dL — AB (ref 28–170)
Saturation Ratios: 4 % — ABNORMAL LOW (ref 10.4–31.8)
TIBC: 223 ug/dL — AB (ref 250–450)
UIBC: 215 ug/dL

## 2015-12-30 LAB — C DIFFICILE QUICK SCREEN W PCR REFLEX
C Diff antigen: POSITIVE — AB
C Diff toxin: NEGATIVE

## 2015-12-30 LAB — VITAMIN B12: VITAMIN B 12: 516 pg/mL (ref 180–914)

## 2015-12-30 LAB — SODIUM, URINE, RANDOM

## 2015-12-30 LAB — URIC ACID: Uric Acid, Serum: 10.5 mg/dL — ABNORMAL HIGH (ref 2.3–6.6)

## 2015-12-30 MED ORDER — METRONIDAZOLE 500 MG PO TABS
500.0000 mg | ORAL_TABLET | Freq: Three times a day (TID) | ORAL | Status: DC
  Administered 2015-12-30 – 2016-01-04 (×15): 500 mg via ORAL
  Filled 2015-12-30 (×14): qty 1

## 2015-12-30 MED ORDER — INSULIN ASPART 100 UNIT/ML ~~LOC~~ SOLN
0.0000 [IU] | Freq: Every day | SUBCUTANEOUS | Status: DC
  Administered 2015-12-30: 2 [IU] via SUBCUTANEOUS

## 2015-12-30 MED ORDER — PREDNISONE 50 MG PO TABS
50.0000 mg | ORAL_TABLET | Freq: Every day | ORAL | Status: DC
  Administered 2015-12-31: 50 mg via ORAL
  Filled 2015-12-30 (×2): qty 1

## 2015-12-30 MED ORDER — TRAMADOL HCL 50 MG PO TABS: 50.0000 mg | ORAL_TABLET | Freq: Two times a day (BID) | ORAL | Status: DC | PRN

## 2015-12-30 MED ORDER — COLCHICINE 0.6 MG PO TABS
0.3000 mg | ORAL_TABLET | Freq: Every day | ORAL | Status: DC
  Filled 2015-12-30: qty 1

## 2015-12-30 MED ORDER — INSULIN ASPART 100 UNIT/ML ~~LOC~~ SOLN
0.0000 [IU] | Freq: Three times a day (TID) | SUBCUTANEOUS | Status: DC
  Administered 2015-12-30: 5 [IU] via SUBCUTANEOUS
  Administered 2015-12-30: 8 [IU] via SUBCUTANEOUS
  Administered 2015-12-31: 3 [IU] via SUBCUTANEOUS
  Administered 2015-12-31 – 2016-01-01 (×3): 2 [IU] via SUBCUTANEOUS
  Administered 2016-01-03: 3 [IU] via SUBCUTANEOUS
  Administered 2016-01-03: 2 [IU] via SUBCUTANEOUS
  Administered 2016-01-04: 3 [IU] via SUBCUTANEOUS
  Administered 2016-01-04 (×2): 2 [IU] via SUBCUTANEOUS

## 2015-12-30 NOTE — Evaluation (Signed)
Occupational Therapy Evaluation Patient Details Name: Robin [name] MRN: [id number] DOB: [birth year] Today's Date: 12/30/2015    History of Present Illness 82 yo female recently d/c from [facility] on 12/29/15 (abdominal pain / diarrhea CT suggests colitis) but upon arrival home unable to get out of car and EMS called. Pt with bil LE foot pain suspect gout.  PMH: DM, CAD, gout  At baseline, pt walks limited community distances (<1080ft) with SPC or LRAD, slowly and steadily. Pt denies any falls history. Pt reports chronic orthostasis at baseline when supine to sitting, for which she knows to remain seated prior to getting up . Pt lives with daughter and husband at home, independent in all ADL and some assistance with IADL.    Clinical Impression   PT admitted with gout BIL LE with decr mobility compared to baseline. Pt currently with functional limitiations due to the deficits listed below (see OT problem list). PTA living at home as primary care giver to husband.  Pt will benefit from skilled OT to increase their independence and safety with adls and balance to allow discharge SNF. Pt requires incr (A) at this time compared to baseline and family were unable to (A) patient upon d/c from [facility] 12/29/15. Recommend SNF at current level.      Follow Up Recommendations  SNF    Equipment Recommendations  None recommended by OT    Recommendations for Other Services       Precautions / Restrictions Precautions Precautions: Fall      Mobility Bed Mobility Overal bed mobility: Needs Assistance Bed Mobility: Supine to Sit     Supine to sit: Min guard;HOB elevated     General bed mobility comments: requires use of bed rail and x3 attempts to complete transfer. Pt unable to initially come to sitting due to weakness  Transfers Overall transfer level: Needs assistance   Transfers: Sit to/from Stand Sit to Stand: Max assist         General transfer comment: v/c for hand placement and anterior  weight shift    Balance Overall balance assessment: Needs assistance Sitting-balance support: Feet supported;Bilateral upper extremity supported Sitting balance-Leahy Scale: Fair     Standing balance support: Bilateral upper extremity supported;During functional activity Standing balance-Leahy Scale: Poor                              ADL Overall ADL's : Needs assistance/impaired Eating/Feeding: Independent;Sitting   Grooming: Wash/dry hands;Wash/dry face;Minimal assistance;Sitting Grooming Details (indicate cue type and reason): pt required seated position because unable to stand Upper Body Bathing: Min guard;Sitting Upper Body Bathing Details (indicate cue type and reason): requires rest break and seated position Lower Body Bathing: Min guard;Sitting/lateral leans Lower Body Bathing Details (indicate cue type and reason): pt requires seated position and lateral leans to complete peri care Upper Body Dressing : Min guard;Sitting       Toilet Transfer: Moderate assistance;Ambulation;RW;BSC Toilet Transfer Details (indicate cue type and reason): requires incr time and effort to transfer ~5 ft to chair         Functional mobility during ADLs: Moderate assistance;Rolling walker General ADL Comments: Pt with good sequence inside RW adn reports severe pain in L LE compared to R LE. pt able to verbalize need to sit EOB prior to standing for orthostatics and great demo. Pt with decr independence with adls and needs rest breaks due to fatigue     Vision  Perception     Praxis      Pertinent Vitals/Pain Pain Assessment: 0-10 Pain Score: 6  Pain Location: BIL feet ( left hurts the most)     Hand Dominance Right   Extremity/Trunk Assessment Upper Extremity Assessment Upper Extremity Assessment: LUE deficits/detail LUE Deficits / Details: edema hand to elbow and reports "it happened due to my IV at [facility]" pt provided retrograde massage to help with edema and  elevated on pillows. pt educated on how to perform retrograde herself. pt with AROM shoulder ~90 degree due to weight of L UE. pt with AAROM able to reach full ROM.   Lower Extremity Assessment Lower Extremity Assessment: Defer to PT evaluation;RLE deficits/detail;LLE deficits/detail RLE Deficits / Details: painful LLE Deficits / Details: painful and edema   Cervical / Trunk Assessment Cervical / Trunk Assessment: Kyphotic   Communication Communication Communication: HOH   Cognition Arousal/Alertness: Awake/alert Behavior During Therapy: WFL for tasks assessed/performed Overall Cognitive Status: Within Functional Limits for tasks assessed                     General Comments       Exercises       Shoulder Instructions      Home Living Family/patient expects to be discharged to:: Private residence Living Arrangements: Spouse/significant other;Children Available Help at Discharge: Family;Available 24 hours/day Type of Home: House Home Access: Stairs to enter           Bathroom Shower/Tub: Teacher, early years/pre: Standard     Home Equipment: Environmental consultant - 2 wheels;Cane - single point;Bedside commode;Shower seat          Prior Functioning/Environment Level of Independence: Independent with assistive device(s)        Comments: uses SPC or walker in community but at home uses nothing inside the house. pt reports "i try not to use anything because i have to help my huband. He is invalid"    OT Diagnosis: Generalized weakness;Acute pain   OT Problem List: Decreased strength;Decreased activity tolerance;Impaired balance (sitting and/or standing);Decreased safety awareness;Decreased knowledge of use of DME or AE;Decreased knowledge of precautions;Impaired UE functional use   OT Treatment/Interventions: Self-care/ADL training;Therapeutic exercise;DME and/or AE instruction;Therapeutic activities;Patient/family education;Balance training    OT  Goals(Current goals can be found in the care plan section) Acute Rehab OT Goals Patient Stated Goal: to return to helping my husband OT Goal Formulation: With patient Time For Goal Achievement: 01/13/16 Potential to Achieve Goals: Good  OT Frequency: Min 2X/week   Barriers to D/C: Decreased caregiver support  daughter works nights and husband requires constant care due to w/c bound after hip injury       Co-evaluation              End of Session Equipment Utilized During Treatment: Surveyor, mining Communication: Mobility status;Precautions  Activity Tolerance: Patient tolerated treatment well Patient left: in chair;with call bell/phone within reach   Time: 0729-0800 OT Time Calculation (min): 31 min Charges:  OT General Charges $OT Visit: 1 Procedure OT Evaluation $OT Eval High Complexity: 1 Procedure OT Treatments $Self Care/Home Management : 8-22 mins G-Codes:    Parke Poisson B January 26, 2016, 8:35 AM  Jeri Modena   OTR/L [phone] Office: [phone] .

## 2015-12-30 NOTE — Consult Note (Addendum)
   [facility] CM Inpatient Consult   12/30/2015  Robin [name] 06/26/1928 [id number]   Patient evaluated for community based chronic disease management services with [facility] as a benefit of patient's Medicare Insurance. Patient was discharged from [facility] yesterday and was admitted to [facility] in the same day because her feet was hurting so that she could not ambulate. Patient with HF and she states her doctors said her kidneys were not at their best.  Patient endorses. Chevis Pretty, NP to be her primary care provider.  Patient states she has a daughter the helps her with her medical decisions but her daughter works at night. Patient states her husband is there with her.   Consent form signed.  Patient states her daughter wanted her to have some follow up at home.   Patient will receive post hospital discharge call and will be evaluated for monthly home visits for assessments and disease process education.  Left contact information and [facility] literature at bedside. Made Inpatient Case Manager aware that [place] [facility] following. Of note, [facility] Care Management services does not replace or interfere with any services that are arranged by inpatient case management or social work.  For additional questions or referrals please contact:   Natividad Brood, RN BSN [facility] Liaison  [phone] business mobile phone Toll free office [phone]

## 2015-12-30 NOTE — Evaluation (Signed)
Physical Therapy Evaluation Patient Details Name: Robin [name] MRN: [id number] DOB: July 18, 1928 Today's Date: 12/30/2015   History of Present Illness  82 yo female recently d/c from [facility] on 12/29/15 (abdominal pain / diarrhea CT suggests colitis) but upon arrival home unable to get out of car and EMS called. Pt with bil LE foot pain suspect gout.  PMH: DM, CAD, gout  At baseline, pt walks limited community distances (<1073ft) with SPC or LRAD, slowly and steadily. Pt denies any falls history. Pt reports chronic orthostasis at baseline when supine to sitting, for which she knows to remain seated prior to getting up . Pt lives with daughter and husband at home, independent in all ADL and some assistance with IADL.   Clinical Impression  Patient demonstrates deficits in functional mobility as indicated below. Will need continued skilled PT to address deficits and maximize function. Will see as indicated and progress as tolerated. Feel patient would benefit from [facility] services and initial supervision, however, patient does not wish to have further PT services after leaving the hospital.    Follow Up Recommendations Supervision for mobility/OOB (would benefit from [place] but patient declines)    Equipment Recommendations  None recommended by PT    Recommendations for Other Services       Precautions / Restrictions Precautions Precautions: Fall Restrictions Weight Bearing Restrictions: No      Mobility  Bed Mobility Overal bed mobility: Needs Assistance Bed Mobility: Supine to Sit     Supine to sit: Min guard;HOB elevated     General bed mobility comments: requires use of bed rail and x3 attempts to complete transfer. Pt unable to initially come to sitting due to weakness  Transfers                 General transfer comment: received in chair  Ambulation/Gait Ambulation/Gait assistance: Min guard Ambulation Distance (Feet): 60 Feet Assistive device: Rolling walker (2  wheeled) Gait Pattern/deviations: Step-to pattern;Decreased stride length;Shuffle;Trunk flexed;Antalgic Gait velocity: significantly decreased Gait velocity interpretation: Below normal speed for age/gender General Gait Details: very slow and intentional with steps, non-functional stride with LLE antalgic pattern  Stairs            Wheelchair Mobility    Modified Rankin (Stroke Patients Only)       Balance     Sitting balance-Leahy Scale: Fair     Standing balance support: Bilateral upper extremity supported Standing balance-Leahy Scale: Poor                               Pertinent Vitals/Pain Pain Assessment: 0-10 Pain Score: 4  Pain Location: bilateral feet, L > R; right knee Pain Descriptors / Indicators: Constant;Guarding;Sore Pain Intervention(s): Limited activity within patient's tolerance;Monitored during session;Repositioned    Home Living Family/patient expects to be discharged to:: Private residence Living Arrangements: Spouse/significant other;Children Available Help at Discharge: Family;Available 24 hours/day Type of Home: House Home Access: Stairs to enter   [employer] of Steps: 1 Home Layout: One level Home Equipment: Walker - 2 wheels;Cane - single point;Bedside commode;Shower seat Additional Comments: Daughter works nights, 12hr shifts 2 on 2 off.     Prior Function Level of Independence: Independent with assistive device(s)         Comments: uses SPC or walker in community but at home uses nothing inside the house. pt reports "i try not to use anything because i have to help my huband. He is  invalid"     Hand Dominance   Dominant Hand: Right    Extremity/Trunk Assessment   Upper Extremity Assessment: Defer to OT evaluation           Lower Extremity Assessment: Generalized weakness RLE Deficits / Details: painful LLE Deficits / Details: limited ROM LLE, increased pain during weight bearing  Cervical /  Trunk Assessment: Kyphotic  Communication   Communication: HOH  Cognition Arousal/Alertness: Awake/alert Behavior During Therapy: WFL for tasks assessed/performed Overall Cognitive Status: Within Functional Limits for tasks assessed                      General Comments General comments (skin integrity, edema, etc.): educated on elevation for edema control in addition to general LE ther ex to improve circulation and ROM mainly LLE. Patient recpetive.    Exercises        Assessment/Plan    PT Assessment Patient needs continued PT services  PT Diagnosis Generalized weakness   PT Problem List Decreased strength;Decreased activity tolerance;Decreased balance;Decreased mobility;Decreased coordination;Decreased safety awareness;Pain  PT Treatment Interventions DME instruction;Stair training;Functional mobility training;Therapeutic activities;Therapeutic exercise;Balance training;Patient/family education   PT Goals (Current goals can be found in the Care Plan section) Acute Rehab PT Goals Patient Stated Goal: to return to helping my husband PT Goal Formulation: With patient Time For Goal Achievement: 01/13/16 Potential to Achieve Goals: Good    Frequency Min 3X/week   Barriers to discharge        Co-evaluation               End of Session Equipment Utilized During Treatment: Gait belt Activity Tolerance: Patient tolerated treatment well;No increased pain Patient left: in chair;with call bell/phone within reach;with chair alarm set Nurse Communication: Mobility status;Other (comment)         Time: 1106-1130 PT Time Calculation (min) (ACUTE ONLY): 24 min   Charges:   PT Evaluation $PT Eval Moderate Complexity: 1 Procedure     PT G CodesDuncan Dull January 04, 2016, 1:53 PM Alben Deeds, [place] DPT  [phone]

## 2015-12-30 NOTE — Progress Notes (Signed)
PROGRESS NOTE  Robin [name] [id number] DOB: 1928/03/18 DOA: 12/29/2015 PCP: Chevis Pretty, FNP  82 year old female with history of chronic kidney disease, diabetes, coronary artery disease is just recently admitted for colitis was treated with Flagyl and Cipro while hospitalized develop was thought to be gout and was treated with colchicine discharged to home unable to tolerate weightbearing secondary to pain and was brought back was found to have worsening renal function and hyperkalemia  Assessment/Plan: Acute on chronic kidney failure ([place]) - possibly secondary to decreased by mouth intake. We'll hold for tonight torsemide and give gentle fluids, stop lisinopril   Anemia in CKD (chronic kidney disease) - an anemia panel and Hemoccult stool   Atrial fibrillation (HCC) - patient on Plavix not on anticoagulation will need to clarify this any evidence of blood loss and defers any contraindication for anticoagulation. CHA2D Vasc score 7  Chronic diastolic heart failure (HCC) - despite trace edema of the feet bilaterally which could be secondary to gout overall appears to be fluid down with increasing renal insufficiency we'll hold torsemide for now will need to restart still with fluid overload   Colitis -  test for C. difficile. Given prolongation of QTC hold off on Cipro continue Flagyl for now and reassess  Diabetes mellitus type 2 with complications ([place]) -SSI -lantus given  Essential hypertension continue home medications  Hyperkalemia: -stop lisinopril and Potassium administer gentle fluids  Debility will need PT OT evaluation benefit from rehabilitation placement   Gout- weaning trial of prednisone -? Arthritis-- will give ultram as well  Dehydration hold torsemide give gentle fluids   ?UTI -await culture before treating  Code Status: full Family Communication: patient Disposition Plan:    Consultants:    Procedures:     HPI/Subjective: Less  diarrhea today  Objective: Filed Vitals:   12/30/15 0435 12/30/15 1100  BP: 135/46 131/66  Pulse: 75 73  Temp: 97.8 F (36.6 C) 97.6 F (36.4 C)  Resp: 22 18    Intake/Output Summary (Last 24 hours) at 12/30/15 1158 Last data filed at 12/30/15 1015  Gross per 24 hour  Intake   1895 ml  Output    201 ml  Net   1694 ml   Filed Weights   12/29/15 1533 12/29/15 2308 12/30/15 0435  Weight: 52.164 kg (115 lb) 57.6 kg (126 lb 15.8 oz) 58.1 kg (128 lb 1.4 oz)    Exam:   General:  Awake, NAD  Cardiovascular: irr  Respiratory: clear  Abdomen: +BS, soft  Musculoskeletal: min edema, no tenderness   Data Reviewed: Basic Metabolic Panel:  Recent Labs Lab 12/26/15 0559 12/27/15 0825 12/28/15 0850 12/29/15 1730 12/30/15 0606  NA 139 137 135 133* 132*  K 3.4* 3.8 4.4 5.5* 5.0  CL 112* 110 109 106 107  CO2 21* 19* 18* 16* 16*  GLUCOSE 102* 96 173* 186* 191*  BUN 45* 41* 44* 52* 58*  CREATININE 2.32* 2.45* 2.65* 2.90* 2.82*  CALCIUM 7.9* 8.1* 7.9* 8.8* 8.2*  MG  --   --   --   --  1.8  PHOS  --   --   --   --  5.5*   Liver Function Tests:  Recent Labs Lab 12/24/15 1927 12/25/15 0608 12/27/15 0825 12/30/15 0606  AST 42* 33 30 36  ALT 18 15 15 16   ALKPHOS 69 55 60 68  BILITOT 0.9 0.7 0.6 0.6  PROT 6.7 5.3* 5.2* 5.0*  ALBUMIN 2.7* 2.1* 2.0* 1.6*    Recent  Labs Lab 12/24/15 1927  LIPASE 35   No results for input(s): AMMONIA in the last 168 hours. CBC:  Recent Labs Lab 12/24/15 1927  12/26/15 0559 12/27/15 0825 12/28/15 0850 12/29/15 1730 12/30/15 0606  WBC 24.0*  < > 20.0* 19.2* 14.9* 16.6* 10.4  NEUTROABS 20.3*  --   --  16.1* 11.1*  --   --   HGB 9.2*  < > 7.8* 8.2* 7.7* 8.3* 7.6*  HCT 29.5*  < > 25.2* 26.6* 24.6* 27.7* 24.7*  MCV 87.5  < > 87.2 87.2 86.3 84.7 84.9  PLT 385  < > 320 361 327 419* 411*  < > = values in this interval not displayed. Cardiac Enzymes: No results for input(s): CKTOTAL, CKMB, CKMBINDEX, TROPONINI in the last 168  hours. BNP (last 3 results) No results for input(s): BNP in the last 8760 hours.  ProBNP (last 3 results) No results for input(s): PROBNP in the last 8760 hours.  CBG:  Recent Labs Lab 12/29/15 1201 12/29/15 2339 12/30/15 0424 12/30/15 0815 12/30/15 1152  GLUCAP 169* 154* 174* 163* 236*    Recent Results (from the past 240 hour(s))  Blood culture (routine x 2)     Status: None   Collection Time: 12/24/15  8:43 PM  Result Value Ref Range Status   Specimen Description BLOOD RIGHT ANTECUBITAL  Final   Special Requests BOTTLES DRAWN AEROBIC AND ANAEROBIC 6CC  Final   Culture NO GROWTH 5 DAYS  Final   Report Status 12/29/2015 FINAL  Final  Blood culture (routine x 2)     Status: None   Collection Time: 12/24/15  9:05 PM  Result Value Ref Range Status   Specimen Description BLOOD RIGHT ANTECUBITAL  Final   Special Requests BOTTLES DRAWN AEROBIC AND ANAEROBIC 6CC  Final   Culture NO GROWTH 5 DAYS  Final   Report Status 12/29/2015 FINAL  Final  Urine culture     Status: None (Preliminary result)   Collection Time: 12/29/15  8:41 PM  Result Value Ref Range Status   Specimen Description URINE, CLEAN CATCH  Final   Special Requests NONE  Final   Culture TOO YOUNG TO READ  Final   Report Status PENDING  Incomplete     Studies: Dg Ankle 2 Views Left  12/29/2015  CLINICAL DATA:  Both ankles and feet are painful. No trauma. History of gout. Soft tissue swelling over the left lateral malleolus. EXAM: LEFT ANKLE - 2 VIEW COMPARISON:  None. FINDINGS: Soft tissue swelling over the left ankle. No evidence of acute fracture or dislocation. Bone cortex appears intact. No evidence of significant bone erosion. Vascular calcifications in the soft tissues. No radiopaque soft tissue foreign bodies or gas collections. IMPRESSION: Soft tissue swelling.  No acute bony abnormalities. Electronically Signed   By: Lucienne Capers M.D.   On: 12/29/2015 22:53   Dg Ankle 2 Views Right  12/29/2015   CLINICAL DATA:  Acute onset of right ankle pain.  Initial encounter. EXAM: RIGHT ANKLE - 2 VIEW COMPARISON:  None. FINDINGS: There is no evidence of fracture or dislocation. The ankle mortise is intact; the interosseous space is within normal limits. No talar tilt or subluxation is seen. The joint spaces are preserved. Mild medial soft tissue swelling is noted. Diffuse vascular calcifications are seen. IMPRESSION: 1. No evidence of fracture or dislocation. 2. Diffuse vascular calcifications seen. Electronically Signed   By: Garald Balding M.D.   On: 12/29/2015 22:54   Dg Abd 1 View  12/29/2015  CLINICAL DATA:  Lower abdominal pain with distention and diarrhea for 5 days. EXAM: ABDOMEN - 1 VIEW COMPARISON:  CT abdomen and pelvis 12/24/2015.  Abdomen 08/14/2015. FINDINGS: Scattered gas and stool in the colon. No small or large bowel distention. Mild thumbprinting in the descending colon corresponding to wall thickening from colitis seen on previous CT scan. No radiopaque stones. Calcified phleboliths in the pelvis. Vascular calcifications. Degenerative changes and scoliosis of the lumbar spine with convexity towards the left. IMPRESSION: Nonobstructive bowel gas pattern. Thumb printing demonstrated over the descending colon corresponding to changes of colitis seen on prior CT scan. Electronically Signed   By: Lucienne Capers M.D.   On: 12/29/2015 20:55    Scheduled Meds: . aspirin EC  81 mg Oral Daily  . atorvastatin  40 mg Oral q1800  . clopidogrel  75 mg Oral Daily  . [START ON 12/31/2015] colchicine  0.3 mg Oral Daily  . insulin aspart  0-15 Units Subcutaneous TID WC  . insulin aspart  0-5 Units Subcutaneous QHS  . insulin glargine  10 Units Subcutaneous Q2200  . metronidazole  500 mg Intravenous Q8H  . pantoprazole  40 mg Oral Daily  . [START ON 12/31/2015] predniSONE  50 mg Oral Q breakfast  . sodium chloride flush  3 mL Intravenous Q12H   Continuous Infusions:  Antibiotics Given (last 72  hours)    Date/Time Action Medication Dose Rate   12/29/15 2230 Given   metroNIDAZOLE (FLAGYL) IVPB 500 mg 500 mg 100 mL/hr   12/30/15 0458 Given   metroNIDAZOLE (FLAGYL) IVPB 500 mg 500 mg 100 mL/hr      Active Problems:   CORONARY ATHEROSCLEROSIS NATIVE CORONARY ARTERY   Chronic diastolic heart failure (HCC)   Atrial fibrillation (HCC)   Essential hypertension   Diabetes mellitus type 2 with complications (HCC)   Anemia in CKD (chronic kidney disease)   Colitis   Acute on chronic kidney failure (HCC)   Hyperkalemia   Debility   Dehydration   Prolonged QT interval    Time spent: 25 min    [facility] Pager [phone]. If 7PM-7AM, please contact night-coverage at [email or web address], password [facility] 12/30/2015, 11:58 AM  LOS: 1 day

## 2015-12-30 NOTE — Progress Notes (Signed)
VASCULAR LAB PRELIMINARY  PRELIMINARY  PRELIMINARY  PRELIMINARY  Bilateral lower extremity venous Doppler completed.    Bilateral:  No evidence of DVT, superficial thrombosis, or Baker's Cyst.  Janifer Adie, RVT, RDMS 12/30/2015, 3:45 PM

## 2015-12-31 DIAGNOSIS — I5032 Chronic diastolic (congestive) heart failure: Secondary | ICD-10-CM

## 2015-12-31 DIAGNOSIS — R5381 Other malaise: Secondary | ICD-10-CM

## 2015-12-31 LAB — GLUCOSE, CAPILLARY
GLUCOSE-CAPILLARY: 148 mg/dL — AB (ref 65–99)
Glucose-Capillary: 164 mg/dL — ABNORMAL HIGH (ref 65–99)
Glucose-Capillary: 131 mg/dL — ABNORMAL HIGH (ref 65–99)

## 2015-12-31 LAB — HEMOGLOBIN A1C
HEMOGLOBIN A1C: 6.8 % — AB (ref 4.8–5.6)
MEAN PLASMA GLUCOSE: 148 mg/dL

## 2015-12-31 LAB — BASIC METABOLIC PANEL
Anion gap: 9 (ref 5–15)
BUN: 67 mg/dL — AB (ref 6–20)
CHLORIDE: 109 mmol/L (ref 101–111)
CO2: 16 mmol/L — AB (ref 22–32)
CREATININE: 2.99 mg/dL — AB (ref 0.44–1.00)
Calcium: 8.2 mg/dL — ABNORMAL LOW (ref 8.9–10.3)
GFR calc Af Amer: 15 mL/min — ABNORMAL LOW (ref >60)
GFR calc non Af Amer: 13 mL/min — ABNORMAL LOW (ref >60)
Glucose, Bld: 183 mg/dL — ABNORMAL HIGH (ref 65–99)
POTASSIUM: 4.9 mmol/L (ref 3.5–5.1)
SODIUM: 134 mmol/L — AB (ref 135–145)

## 2015-12-31 LAB — CBC
HEMATOCRIT: 26.9 % — AB (ref 36.0–46.0)
HEMOGLOBIN: 8.2 g/dL — AB (ref 12.0–15.0)
MCH: 25.6 pg — AB (ref 26.0–34.0)
MCHC: 30.5 g/dL (ref 30.0–36.0)
MCV: 84.1 fL (ref 78.0–100.0)
Platelets: 511 10*3/uL — ABNORMAL HIGH (ref 150–400)
RBC: 3.2 MIL/uL — AB (ref 3.87–5.11)
RDW: 17.1 % — ABNORMAL HIGH (ref 11.5–15.5)
WBC: 13.5 10*3/uL — ABNORMAL HIGH (ref 4.0–10.5)

## 2015-12-31 LAB — URINE CULTURE

## 2015-12-31 MED ORDER — OXYMETAZOLINE HCL 0.05 % NA SOLN
2.0000 | Freq: Two times a day (BID) | NASAL | Status: DC | PRN
  Filled 2015-12-31: qty 15

## 2015-12-31 MED ORDER — SODIUM CHLORIDE 0.9 % IV SOLN
INTRAVENOUS | Status: DC
  Administered 2015-12-31 – 2016-01-02 (×3): via INTRAVENOUS

## 2015-12-31 MED ORDER — HALOPERIDOL LACTATE 5 MG/ML IJ SOLN
2.0000 mg | Freq: Four times a day (QID) | INTRAMUSCULAR | Status: DC | PRN
  Administered 2015-12-31: 2 mg via INTRAVENOUS
  Filled 2015-12-31: qty 1

## 2015-12-31 NOTE — Progress Notes (Signed)
Utilization review completed. Yicel Shannon, RN, BSN. 

## 2015-12-31 NOTE — Progress Notes (Signed)
Encouraged patient to allow [place] to start NS fluids, as ordered by MD, back.  Patient stated she will pull it out.  No fluids infusing at present

## 2015-12-31 NOTE — Progress Notes (Signed)
Nurse tech notified nurse of patient stating that her nose was bleeding. Assessed patient and noticed patient's nose was bleeding a small amount of blood with a small dime size blood clot that was in kleenex that the patient had saved to show the nurse. Nose had stopped bleeding by the time nurse arrived. Patient states she also has a slight headache but has recently had vicodin and does not want any pain medication unless it gets worse. Notified on-call hospitalist via text paged for informational purposes and advised patient to let the doctor know when they make their rounds in the morning. Patient states that this has been occurring at home and that the nosebleeds don't last long. She states has noticed it has been occurring more frequent at home and wonders if it's from a past surgery she had done in her nose several years ago, a past surgical procedure to remove two nodules. Will continue to monitor patient to end of shift.

## 2015-12-31 NOTE — Progress Notes (Signed)
Orders given from hospitalist, for Afrin nasal spray as needed if nosebleed occurs again. Will continue to monitor patient to end of shift.

## 2015-12-31 NOTE — Progress Notes (Signed)
PROGRESS NOTE  Robin [name] [id number] DOB: [birth year] DOA: 12/29/2015 PCP: Chevis Pretty, FNP  82 year old female with history of chronic kidney disease, diabetes, coronary artery disease is just recently admitted for colitis was treated with Flagyl and Cipro while hospitalized develop was thought to be gout and was treated with colchicine discharged to home unable to tolerate weightbearing secondary to pain and was brought back was found to have worsening renal function and hyperkalemia  Assessment/Plan: Acute on chronic kidney failure ([place]) -  -possibly secondary to decreased by mouth intake.  -hold torsemide  -gentle fluids, stop lisinopril   Anemia in CKD (chronic kidney disease) - an anemia panel and Hemoccult stool   Atrial fibrillation ([place]) - patient on Plavix not on anticoagulation will need to clarify this any evidence of blood loss and defers any contraindication for anticoagulation. CHA2D Vasc score 7  Chronic diastolic heart failure (HCC) - despite trace edema of the feet bilaterally which could be secondary to gout overall appears to be fluid down with increasing renal insufficiency  - hold torsemide for now will need to restart when able  Colitis -  -will treat for presumed c diff  Diabetes mellitus type 2 with complications (HCC) -SSI -lantus given  Essential hypertension continue home medications  Hyperkalemia: -stop lisinopril and Potassium administer gentle fluids  Debility -home health  Gout- weaning trial of prednisone -? Arthritis-- will give ultram as well  Dehydration hold torsemide give gentle fluids    Code Status: full Family Communication: patient/daughter on phone Disposition Plan:    Consultants:    Procedures:     HPI/Subjective: Less diarrhea today Some soreness in left ankle  Objective: Filed Vitals:   12/31/15 0321 12/31/15 0759  BP: 136/51 127/39  Pulse: 68 77  Temp:  98.5 F (36.9 C)  Resp:  18     Intake/Output Summary (Last 24 hours) at 12/31/15 1205 Last data filed at 12/31/15 1000  Gross per 24 hour  Intake    718 ml  Output    701 ml  Net     17 ml   Filed Weights   12/29/15 2308 12/30/15 0435 12/31/15 0759  Weight: 57.6 kg (126 lb 15.8 oz) 58.1 kg (128 lb 1.4 oz) 57.289 kg (126 lb 4.8 oz)    Exam:   General:  Awake, NAD  Cardiovascular: irr  Respiratory: clear  Abdomen: +BS, soft  Musculoskeletal: min edema, no tenderness   Data Reviewed: Basic Metabolic Panel:  Recent Labs Lab 12/27/15 0825 12/28/15 0850 12/29/15 1730 12/30/15 0606 12/31/15 0451  NA 137 135 133* 132* 134*  K 3.8 4.4 5.5* 5.0 4.9  CL 110 109 106 107 109  CO2 19* 18* 16* 16* 16*  GLUCOSE 96 173* 186* 191* 183*  BUN 41* 44* 52* 58* 67*  CREATININE 2.45* 2.65* 2.90* 2.82* 2.99*  CALCIUM 8.1* 7.9* 8.8* 8.2* 8.2*  MG  --   --   --  1.8  --   PHOS  --   --   --  5.5*  --    Liver Function Tests:  Recent Labs Lab 12/24/15 1927 12/25/15 0608 12/27/15 0825 12/30/15 0606  AST 42* 33 30 36  ALT 18 15 15 16   ALKPHOS 69 55 60 68  BILITOT 0.9 0.7 0.6 0.6  PROT 6.7 5.3* 5.2* 5.0*  ALBUMIN 2.7* 2.1* 2.0* 1.6*    Recent Labs Lab 12/24/15 1927  LIPASE 35   No results for input(s): AMMONIA in the last 168 hours. CBC:  Recent Labs Lab 12/24/15 1927  12/27/15 0825 12/28/15 0850 12/29/15 1730 12/30/15 0606 12/31/15 0451  WBC 24.0*  < > 19.2* 14.9* 16.6* 10.4 13.5*  NEUTROABS 20.3*  --  16.1* 11.1*  --   --   --   HGB 9.2*  < > 8.2* 7.7* 8.3* 7.6* 8.2*  HCT 29.5*  < > 26.6* 24.6* 27.7* 24.7* 26.9*  MCV 87.5  < > 87.2 86.3 84.7 84.9 84.1  PLT 385  < > 361 327 419* 411* 511*  < > = values in this interval not displayed. Cardiac Enzymes: No results for input(s): CKTOTAL, CKMB, CKMBINDEX, TROPONINI in the last 168 hours. BNP (last 3 results) No results for input(s): BNP in the last 8760 hours.  ProBNP (last 3 results) No results for input(s): PROBNP in the last 8760  hours.  CBG:  Recent Labs Lab 12/30/15 0815 12/30/15 1152 12/30/15 1707 12/30/15 2209 12/31/15 0654  GLUCAP 163* 236* 211* 207* 148*    Recent Results (from the past 240 hour(s))  Blood culture (routine x 2)     Status: None   Collection Time: 12/24/15  8:43 PM  Result Value Ref Range Status   Specimen Description BLOOD RIGHT ANTECUBITAL  Final   Special Requests BOTTLES DRAWN AEROBIC AND ANAEROBIC 6CC  Final   Culture NO GROWTH 5 DAYS  Final   Report Status 12/29/2015 FINAL  Final  Blood culture (routine x 2)     Status: None   Collection Time: 12/24/15  9:05 PM  Result Value Ref Range Status   Specimen Description BLOOD RIGHT ANTECUBITAL  Final   Special Requests BOTTLES DRAWN AEROBIC AND ANAEROBIC 6CC  Final   Culture NO GROWTH 5 DAYS  Final   Report Status 12/29/2015 FINAL  Final  Urine culture     Status: None   Collection Time: 12/29/15  8:41 PM  Result Value Ref Range Status   Specimen Description URINE, CLEAN CATCH  Final   Special Requests NONE  Final   Culture MULTIPLE SPECIES PRESENT, SUGGEST RECOLLECTION  Final   Report Status 12/31/2015 FINAL  Final  C difficile quick scan w PCR reflex     Status: Abnormal   Collection Time: 12/30/15  3:06 PM  Result Value Ref Range Status   C Diff antigen POSITIVE (A) NEGATIVE Final   C Diff toxin NEGATIVE NEGATIVE Final   C Diff interpretation   Final    C. difficile present, but toxin not detected. This indicates colonization. In most cases, this does not require treatment. If patient has signs and symptoms consistent with colitis, consider treatment. Requires ENTERIC precautions.     Studies: Dg Ankle 2 Views Left  12/29/2015  CLINICAL DATA:  Both ankles and feet are painful. No trauma. History of gout. Soft tissue swelling over the left lateral malleolus. EXAM: LEFT ANKLE - 2 VIEW COMPARISON:  None. FINDINGS: Soft tissue swelling over the left ankle. No evidence of acute fracture or dislocation. Bone cortex appears  intact. No evidence of significant bone erosion. Vascular calcifications in the soft tissues. No radiopaque soft tissue foreign bodies or gas collections. IMPRESSION: Soft tissue swelling.  No acute bony abnormalities. Electronically Signed   By: Lucienne Capers M.D.   On: 12/29/2015 22:53   Dg Ankle 2 Views Right  12/29/2015  CLINICAL DATA:  Acute onset of right ankle pain.  Initial encounter. EXAM: RIGHT ANKLE - 2 VIEW COMPARISON:  None. FINDINGS: There is no evidence of fracture or dislocation. The ankle mortise  is intact; the interosseous space is within normal limits. No talar tilt or subluxation is seen. The joint spaces are preserved. Mild medial soft tissue swelling is noted. Diffuse vascular calcifications are seen. IMPRESSION: 1. No evidence of fracture or dislocation. 2. Diffuse vascular calcifications seen. Electronically Signed   By: Garald Balding M.D.   On: 12/29/2015 22:54   Dg Abd 1 View  12/29/2015  CLINICAL DATA:  Lower abdominal pain with distention and diarrhea for 5 days. EXAM: ABDOMEN - 1 VIEW COMPARISON:  CT abdomen and pelvis 12/24/2015.  Abdomen 08/14/2015. FINDINGS: Scattered gas and stool in the colon. No small or large bowel distention. Mild thumbprinting in the descending colon corresponding to wall thickening from colitis seen on previous CT scan. No radiopaque stones. Calcified phleboliths in the pelvis. Vascular calcifications. Degenerative changes and scoliosis of the lumbar spine with convexity towards the left. IMPRESSION: Nonobstructive bowel gas pattern. Thumb printing demonstrated over the descending colon corresponding to changes of colitis seen on prior CT scan. Electronically Signed   By: Lucienne Capers M.D.   On: 12/29/2015 20:55    Scheduled Meds: . aspirin EC  81 mg Oral Daily  . atorvastatin  40 mg Oral q1800  . clopidogrel  75 mg Oral Daily  . insulin aspart  0-15 Units Subcutaneous TID WC  . insulin aspart  0-5 Units Subcutaneous QHS  . insulin  glargine  10 Units Subcutaneous Q2200  . metroNIDAZOLE  500 mg Oral 3 times per day  . pantoprazole  40 mg Oral Daily  . predniSONE  50 mg Oral Q breakfast  . sodium chloride flush  3 mL Intravenous Q12H   Continuous Infusions: . sodium chloride 75 mL/hr at 12/31/15 1014   Antibiotics Given (last 72 hours)    Date/Time Action Medication Dose Rate   12/29/15 2230 Given   metroNIDAZOLE (FLAGYL) IVPB 500 mg 500 mg 100 mL/hr   12/30/15 0458 Given   metroNIDAZOLE (FLAGYL) IVPB 500 mg 500 mg 100 mL/hr   12/30/15 1400 Given   metroNIDAZOLE (FLAGYL) tablet 500 mg 500 mg    12/30/15 2340 Given   metroNIDAZOLE (FLAGYL) tablet 500 mg 500 mg    12/31/15 [id number] Given   metroNIDAZOLE (FLAGYL) tablet 500 mg 500 mg       Active Problems:   CORONARY ATHEROSCLEROSIS NATIVE CORONARY ARTERY   Chronic diastolic heart failure (HCC)   Atrial fibrillation ([place])   Essential hypertension   Diabetes mellitus type 2 with complications ([place])   Anemia in CKD (chronic kidney disease)   Colitis   Acute on chronic kidney failure ([place])   Hyperkalemia   Debility   Dehydration   Prolonged QT interval    Time spent: 25 min    [place] [facility] Pager [phone]. If 7PM-7AM, please contact night-coverage at [email or web address], password [facility] 12/31/2015, 12:05 PM  LOS: 2 days

## 2015-12-31 NOTE — Care Management Note (Signed)
Case Management Note  Patient Details  Name: Robin [name] MRN: [id number] Date of Birth: 10/16/1928  Subjective/Objective:         Admitted with CHF           Action/Plan: CM talked to patient about [place] choices, patient requested that I talk to her daughter Robin [name]. TCT [name] for [facility] choices, she requested [facility]; Butch Penny with [facility] called for arrangements.  Expected Discharge Date:  12/30/15               Expected Discharge Plan:  [place]  Discharge planning Services  CM Consult  HH Arranged:  RN, Disease Management, PT [facility] Agency:   [facility]  Status of Service:  In process, will continue to follow  Sherrilyn Rist [id number] 12/31/2015, 2:02 PM

## 2015-12-31 NOTE — Progress Notes (Signed)
Came back to see patient as nursing described her as agitated.  Refusing interventions and threatening to leave.  Will get PRN haldol if able.  Family to come down Will d/c steroids  Eulogio Bear

## 2015-12-31 NOTE — Progress Notes (Addendum)
Inpatient Diabetes Program Recommendations  AACE/ADA: New Consensus Statement on Inpatient Glycemic Control (2015)  Target Ranges:  Prepandial:   less than 140 mg/dL      Peak postprandial:   less than 180 mg/dL (1-2 hours)      Critically ill patients:  140 - 180 mg/dL   Results for VESTAL, DELVAL (MRN [id number]) as of 12/31/2015 13:48  Ref. Range 12/30/2015 08:15 12/30/2015 11:52 12/30/2015 17:07 12/30/2015 22:09  Glucose-Capillary Latest Ref Range: 65-99 mg/dL 163 (H) 236 (H) 211 (H) 207 (H)    Admit with: Acute on Chronic Renal Failure/ Weakness  History: DM, CKD, CHF  Home DM Meds: Lantus 10 units QHS       Amaryl 2 mg daily       Tradjenta 5 mg QHS  Current Insulin Orders: Lantus 10 units QHS      Novolog Moderate SSI (0-15 units) TID AC + HS      -Note Prednisone decreased to 50 mg daily today.  -Eating 50-60% of meals.  -Patient having elevated postprandial glucose levels.      MD- Please consider starting low dose Novolog Meal Coverage while home PO DM meds on hold-  Novolog 3 units tidwc     --Will follow patient during hospitalization--  Wyn Quaker RN, MSN, CDE Diabetes Coordinator Inpatient Glycemic Control Team Team Pager: [phone] (8a-5p)

## 2016-01-01 DIAGNOSIS — I10 Essential (primary) hypertension: Secondary | ICD-10-CM

## 2016-01-01 LAB — CBC
HCT: 25.3 % — ABNORMAL LOW (ref 36.0–46.0)
Hemoglobin: 7.8 g/dL — ABNORMAL LOW (ref 12.0–15.0)
MCH: 25.7 pg — ABNORMAL LOW (ref 26.0–34.0)
MCHC: 30.8 g/dL (ref 30.0–36.0)
MCV: 83.2 fL (ref 78.0–100.0)
PLATELETS: 518 10*3/uL — AB (ref 150–400)
RBC: 3.04 MIL/uL — AB (ref 3.87–5.11)
RDW: 17.2 % — AB (ref 11.5–15.5)
WBC: 15.8 10*3/uL — AB (ref 4.0–10.5)

## 2016-01-01 LAB — GASTROINTESTINAL PANEL BY PCR, STOOL (REPLACES STOOL CULTURE)
ADENOVIRUS F40/41: NOT DETECTED
ASTROVIRUS: NOT DETECTED
CAMPYLOBACTER SPECIES: NOT DETECTED
Cryptosporidium: NOT DETECTED
Cyclospora cayetanensis: NOT DETECTED
E. COLI O157: NOT DETECTED
ENTEROPATHOGENIC E COLI (EPEC): NOT DETECTED
ENTEROTOXIGENIC E COLI (ETEC): NOT DETECTED
Entamoeba histolytica: NOT DETECTED
Enteroaggregative E coli (EAEC): NOT DETECTED
Giardia lamblia: NOT DETECTED
NOROVIRUS GI/GII: NOT DETECTED
PLESIMONAS SHIGELLOIDES: NOT DETECTED
ROTAVIRUS A: NOT DETECTED
SHIGA LIKE TOXIN PRODUCING E COLI (STEC): NOT DETECTED
Salmonella species: NOT DETECTED
Sapovirus (I, II, IV, and V): NOT DETECTED
Shigella/Enteroinvasive E coli (EIEC): NOT DETECTED
VIBRIO SPECIES: NOT DETECTED
Vibrio cholerae: NOT DETECTED
Yersinia enterocolitica: NOT DETECTED

## 2016-01-01 LAB — BASIC METABOLIC PANEL
Anion gap: 8 (ref 5–15)
BUN: 89 mg/dL — AB (ref 6–20)
CO2: 14 mmol/L — ABNORMAL LOW (ref 22–32)
CREATININE: 3.12 mg/dL — AB (ref 0.44–1.00)
Calcium: 7.8 mg/dL — ABNORMAL LOW (ref 8.9–10.3)
Chloride: 113 mmol/L — ABNORMAL HIGH (ref 101–111)
GFR, EST AFRICAN AMERICAN: 15 mL/min — AB (ref >60)
GFR, EST NON AFRICAN AMERICAN: 13 mL/min — AB (ref >60)
GLUCOSE: 137 mg/dL — AB (ref 65–99)
POTASSIUM: 5 mmol/L (ref 3.5–5.1)
SODIUM: 135 mmol/L (ref 135–145)

## 2016-01-01 LAB — GLUCOSE, CAPILLARY
GLUCOSE-CAPILLARY: 122 mg/dL — AB (ref 65–99)
GLUCOSE-CAPILLARY: 138 mg/dL — AB (ref 65–99)
GLUCOSE-CAPILLARY: 108 mg/dL — AB (ref 65–99)
GLUCOSE-CAPILLARY: 78 mg/dL (ref 65–99)

## 2016-01-01 MED ORDER — SODIUM CHLORIDE 0.9 % IV SOLN
510.0000 mg | Freq: Once | INTRAVENOUS | Status: AC
  Administered 2016-01-01: 510 mg via INTRAVENOUS
  Filled 2016-01-01: qty 17

## 2016-01-01 NOTE — Progress Notes (Signed)
PROGRESS NOTE  Robin [name] [id number] DOB: 08-02-1928 DOA: 12/29/2015 PCP: Chevis Pretty, FNP  82 year old female with history of chronic kidney disease, diabetes, coronary artery disease is just recently admitted for colitis was treated with Flagyl and Cipro while hospitalized develop was thought to be gout and was treated with colchicine discharged to home unable to tolerate weightbearing secondary to pain and was brought back was found to have worsening renal function and hyperkalemia  Assessment/Plan: Acute on chronic kidney failure ([place]) -  -possibly secondary to decreased by mouth intake.  -hold torsemide  -gentle fluids, stop lisinopril  -stop gout meds -not improving -asked nephro to see  Anemia in CKD (chronic kidney disease)  -transfuse for < 7   Atrial fibrillation (HCC) - patient on Plavix not on anticoagulation as has refused per previous notes by cardiology CHA2D Vasc score 7  Chronic diastolic heart failure (HCC) -  - hold torsemide for now will need to restart when able  Colitis/diarrhea -  -will treat for presumed c diff -stools per patient are becoming more firm  Diabetes mellitus type 2 with complications ([place]) -SSI -lantus given  Essential hypertension continue home medications  Hyperkalemia: -stop lisinopril and Potassium administer gentle fluids  Debility -home health- PT eval again today as family not sure they can manage at home  Gout-  -stop steroids as patient was becoming confused -stop colchine -? Arthritis-- will give ultram as well  Dehydration hold torsemide give gentle fluids   H/o pituitary apoplexy   Code Status: full Family Communication: patient/daughter Disposition Plan:    Consultants:    Procedures:     HPI/Subjective: Less diarrhea today No chest pain Became agitated yesterday Wants to go home  Objective: Filed Vitals:   01/01/16 0503 01/01/16 1153  BP: 158/50 131/38  Pulse: 85 86  Temp:  97.7 F (36.5 C) 97.7 F (36.5 C)  Resp: 18 18    Intake/Output Summary (Last 24 hours) at 01/01/16 1218 Last data filed at 01/01/16 0914  Gross per 24 hour  Intake 1712.5 ml  Output    800 ml  Net  912.5 ml   Filed Weights   12/30/15 0435 12/31/15 0759 01/01/16 0503  Weight: 58.1 kg (128 lb 1.4 oz) 57.289 kg (126 lb 4.8 oz) 58.196 kg (128 lb 4.8 oz)    Exam:   General:  Awake, NAD  Cardiovascular: irr  Respiratory: clear  Abdomen: +BS, soft  Musculoskeletal: min edema left ankle, left arm swollen  Data Reviewed: Basic Metabolic Panel:  Recent Labs Lab 12/28/15 0850 12/29/15 1730 12/30/15 0606 12/31/15 0451 01/01/16 0441  NA 135 133* 132* 134* 135  K 4.4 5.5* 5.0 4.9 5.0  CL 109 106 107 109 113*  CO2 18* 16* 16* 16* 14*  GLUCOSE 173* 186* 191* 183* 137*  BUN 44* 52* 58* 67* 89*  CREATININE 2.65* 2.90* 2.82* 2.99* 3.12*  CALCIUM 7.9* 8.8* 8.2* 8.2* 7.8*  MG  --   --  1.8  --   --   PHOS  --   --  5.5*  --   --    Liver Function Tests:  Recent Labs Lab 12/27/15 0825 12/30/15 0606  AST 30 36  ALT 15 16  ALKPHOS 60 68  BILITOT 0.6 0.6  PROT 5.2* 5.0*  ALBUMIN 2.0* 1.6*   No results for input(s): LIPASE, AMYLASE in the last 168 hours. No results for input(s): AMMONIA in the last 168 hours. CBC:  Recent Labs Lab 12/27/15 0825 12/28/15 [id number]  12/29/15 1730 12/30/15 0606 12/31/15 0451 01/01/16 0441  WBC 19.2* 14.9* 16.6* 10.4 13.5* 15.8*  NEUTROABS 16.1* 11.1*  --   --   --   --   HGB 8.2* 7.7* 8.3* 7.6* 8.2* 7.8*  HCT 26.6* 24.6* 27.7* 24.7* 26.9* 25.3*  MCV 87.2 86.3 84.7 84.9 84.1 83.2  PLT 361 327 419* 411* 511* 518*   Cardiac Enzymes: No results for input(s): CKTOTAL, CKMB, CKMBINDEX, TROPONINI in the last 168 hours. BNP (last 3 results) No results for input(s): BNP in the last 8760 hours.  ProBNP (last 3 results) No results for input(s): PROBNP in the last 8760 hours.  CBG:  Recent Labs Lab 12/30/15 2209 12/31/15 0654  12/31/15 1651 12/31/15 2146 01/01/16 0624  GLUCAP 207* 148* 164* 131* 122*    Recent Results (from the past 240 hour(s))  Blood culture (routine x 2)     Status: None   Collection Time: 12/24/15  8:43 PM  Result Value Ref Range Status   Specimen Description BLOOD RIGHT ANTECUBITAL  Final   Special Requests BOTTLES DRAWN AEROBIC AND ANAEROBIC [place]  Final   Culture NO GROWTH 5 DAYS  Final   Report Status 12/29/2015 FINAL  Final  Blood culture (routine x 2)     Status: None   Collection Time: 12/24/15  9:05 PM  Result Value Ref Range Status   Specimen Description BLOOD RIGHT ANTECUBITAL  Final   Special Requests BOTTLES DRAWN AEROBIC AND ANAEROBIC 6CC  Final   Culture NO GROWTH 5 DAYS  Final   Report Status 12/29/2015 FINAL  Final  Urine culture     Status: None   Collection Time: 12/29/15  8:41 PM  Result Value Ref Range Status   Specimen Description URINE, CLEAN CATCH  Final   Special Requests NONE  Final   Culture MULTIPLE SPECIES PRESENT, SUGGEST RECOLLECTION  Final   Report Status 12/31/2015 FINAL  Final  C difficile quick scan w PCR reflex     Status: Abnormal   Collection Time: 12/30/15  3:06 PM  Result Value Ref Range Status   C Diff antigen POSITIVE (A) NEGATIVE Final   C Diff toxin NEGATIVE NEGATIVE Final   C Diff interpretation   Final    C. difficile present, but toxin not detected. This indicates colonization. In most cases, this does not require treatment. If patient has signs and symptoms consistent with colitis, consider treatment. Requires ENTERIC precautions.     Studies: No results found.  Scheduled Meds: . aspirin EC  81 mg Oral Daily  . atorvastatin  40 mg Oral q1800  . clopidogrel  75 mg Oral Daily  . insulin aspart  0-15 Units Subcutaneous TID WC  . insulin aspart  0-5 Units Subcutaneous QHS  . insulin glargine  10 Units Subcutaneous Q2200  . metroNIDAZOLE  500 mg Oral 3 times per day  . pantoprazole  40 mg Oral Daily  . sodium chloride flush  3 mL  Intravenous Q12H   Continuous Infusions: . sodium chloride 75 mL/hr at 12/31/15 1014   Antibiotics Given (last 72 hours)    Date/Time Action Medication Dose Rate   12/29/15 2230 Given   metroNIDAZOLE (FLAGYL) IVPB 500 mg 500 mg 100 mL/hr   12/30/15 0458 Given   metroNIDAZOLE (FLAGYL) IVPB 500 mg 500 mg 100 mL/hr   12/30/15 1400 Given   metroNIDAZOLE (FLAGYL) tablet 500 mg 500 mg    12/30/15 2340 Given   metroNIDAZOLE (FLAGYL) tablet 500 mg 500 mg  12/31/15 [id number] Given   metroNIDAZOLE (FLAGYL) tablet 500 mg 500 mg    12/31/15 2325 Given   metroNIDAZOLE (FLAGYL) tablet 500 mg 500 mg    01/01/16 [id number] Given   metroNIDAZOLE (FLAGYL) tablet 500 mg 500 mg       Active Problems:   CORONARY ATHEROSCLEROSIS NATIVE CORONARY ARTERY   Chronic diastolic heart failure (HCC)   Atrial fibrillation (HCC)   Essential hypertension   Diabetes mellitus type 2 with complications (HCC)   Anemia in CKD (chronic kidney disease)   Colitis   Acute on chronic kidney failure (HCC)   Hyperkalemia   Debility   Dehydration   Prolonged QT interval    Time spent: 25 min    [place] [facility] Pager [phone]. If 7PM-7AM, please contact night-coverage at [email or web address], password [facility] 01/01/2016, 12:18 PM  LOS: 3 days

## 2016-01-01 NOTE — Consult Note (Signed)
   [facility] Inpatient Consult   01/01/2016  Robin [name] 01/12/1928 [id number] Nurse states that patient's daughter had questions regarding [facility] Care Management.  Met with the patient's daughter Robin [name].  Daughter was concerned that the patient may need some rehab before returning home.  She states that she would like information regarding getting the Living Will, Healthcare Power of Ina and the [employer], if possible while she is inpatient and before she in discharged.  Will share this information with inpatient RNCM. Daughter verbalizes understanding of [place] Management for community follow up.  Daughter states she would like her mother to receive her rehab in [place] near the [facility].  Will share this information as well.  For questions, please contact: Natividad Brood, RN BSN [place] Liaison  [phone] business mobile phone Toll free office [phone]

## 2016-01-01 NOTE — Progress Notes (Addendum)
Physical Therapy Treatment Patient Details Name: Robin [name] MRN: [id number] DOB: 10-06-1928 Today's Date: 01/01/2016    History of Present Illness 82 yo female recently d/c from [facility] on 12/29/15 (abdominal pain / diarrhea CT suggests colitis) but upon arrival home unable to get out of car and EMS called. Pt with bil LE foot pain suspect gout.  PMH: DM, CAD, gout  At baseline, pt walks limited community distances (<1046ft) with SPC or LRAD, slowly and steadily. Pt denies any falls history. Pt reports chronic orthostasis at baseline when supine to sitting, for which she knows to remain seated prior to getting up . Pt lives with daughter and husband at home, independent in all ADL and some assistance with IADL.     PT Comments    Pt admitted with above diagnosis. Pt currently with functional limitations due to balance and endurance deficits. Pt will need SNF for therapy prior to d/c home due to weakness and decr endurance for activity.   Pt will benefit from skilled PT to increase their independence and safety with mobility to allow discharge to the venue listed below.    Follow Up Recommendations  SNF;Supervision/Assistance - 24 hour     Equipment Recommendations  Other (comment) (rails for bed at home)    Recommendations for Other Services       Precautions / Restrictions Precautions Precautions: Fall Restrictions Weight Bearing Restrictions: No    Mobility  Bed Mobility Overal bed mobility: Needs Assistance Bed Mobility: Supine to Sit     Supine to sit: Min guard;HOB elevated     General bed mobility comments: requires use of bed rail and x3 attempts to complete transfer. Pt unable to initially come to sitting due to weakness  Transfers Overall transfer level: Needs assistance Equipment used: Rolling walker (2 wheeled) Transfers: Sit to/from Stand Sit to Stand: Min assist;Mod assist         General transfer comment: Has posterior lean and varies between min and mod  assist in standing.  Takes incr time to get balance upon standing.   Ambulation/Gait Ambulation/Gait assistance: Min assist Ambulation Distance (Feet): 85 Feet Assistive device: Rolling walker (2 wheeled) Gait Pattern/deviations: Step-through pattern;Decreased stride length;Shuffle;Trunk flexed;Antalgic Gait velocity: significantly decreased Gait velocity interpretation: Below normal speed for age/gender General Gait Details: very slow and intentional with steps, Needs cues to stay close to rW as well as cues for postural stability.  Cannot withstand challenges to balance.    Stairs            Wheelchair Mobility    Modified Rankin (Stroke Patients Only)       Balance Overall balance assessment: Needs assistance;History of Falls Sitting-balance support: Feet supported;No upper extremity supported Sitting balance-Leahy Scale: Fair     Standing balance support: Bilateral upper extremity supported;During functional activity Standing balance-Leahy Scale: Poor Standing balance comment: Needed UE support on RW.                     Cognition Arousal/Alertness: Awake/alert Behavior During Therapy: WFL for tasks assessed/performed Overall Cognitive Status: Within Functional Limits for tasks assessed                      Exercises      General Comments        Pertinent Vitals/Pain Pain Assessment: Faces Faces Pain Scale: Hurts little more Pain Location: bil Feet Pain Descriptors / Indicators: Aching;Guarding Pain Intervention(s): Limited activity within patient's tolerance;Monitored during session;Repositioned  Sats  90% and > with activity.  DOE 3/4 at end of walk.    Home Living Family/patient expects to be discharged to:: Private residence Living Arrangements: Spouse/significant other;Children Available Help at Discharge: Family;Available 24 hours/day Type of Home: House Home Access: Stairs to enter Entrance Stairs-Rails: None Home Layout: One  level Home Equipment: Environmental consultant - 2 wheels;Cane - single point;Bedside commode;Shower seat Additional Comments: Daughter works nights, 12hr shifts 2 on 2 off.     Prior Function Level of Independence: Independent with assistive device(s)      Comments: uses SPC or walker in community and cane prn at home   PT Goals (current goals can now be found in the care plan section) Progress towards PT goals: Progressing toward goals    Frequency  Min 3X/week    PT Plan Discharge plan needs to be updated    Co-evaluation             End of Session Equipment Utilized During Treatment: Gait belt Activity Tolerance: Patient limited by fatigue Patient left: in chair;with call bell/phone within reach;with chair alarm set;with family/visitor present     Time: 1041-1102 PT Time Calculation (min) (ACUTE ONLY): 21 min  Charges:  $Gait Training: 8-22 mins                    G CodesDenice Paradise January 22, 2016, 1:03 PM [employer] Acute Rehabilitation [phone] (pager)

## 2016-01-01 NOTE — Consult Note (Signed)
Reason for Consult:  AKI/CKD Referring Physician: Eliseo Squires, MD  Robin [name] is an 82 y.o. female.  HPI: Pt is an 81yo WF with PMH significant for HTN, IRDM, CAD, carotid artery disease, diastolic CHF, valvular disease (mod MR and TR), severe pulmonary HTN, and CKD stage 3-4 with baseline Scr of 1.9-2.4 (not seen by a Nephrologist) who was admitted to [facility] on 12/24/15 with diarrhea felt to be infectious colitis and discharged on 12/29/15 only to return later that day to [facility] with weakness, foot pain, and inability to bear weight with ambulation.  She was noted to have hyperkalemia at 5.5 and an elevation of her Scr to 2.9 from 2.65.  We were asked to help evaluate and manage her AKI/CKD.  She denies any N/V, dysuria, pyuria, hematuria, frequency, NSAIDs/Cox-II I's but has not been taking much by mouth.  Trend in Creatinine: CREATININE, SER  Date/Time Value Ref Range Status  01/01/2016 04:41 AM 3.12* 0.44 - 1.00 mg/dL Final  12/31/2015 04:51 AM 2.99* 0.44 - 1.00 mg/dL Final  12/30/2015 06:06 AM 2.82* 0.44 - 1.00 mg/dL Final  12/29/2015 05:30 PM 2.90* 0.44 - 1.00 mg/dL Final  12/28/2015 08:50 AM 2.65* 0.44 - 1.00 mg/dL Final  12/27/2015 08:25 AM 2.45* 0.44 - 1.00 mg/dL Final  12/26/2015 05:59 AM 2.32* 0.44 - 1.00 mg/dL Final  12/25/2015 06:08 AM 2.64* 0.44 - 1.00 mg/dL Final  12/24/2015 07:27 PM 3.10* 0.44 - 1.00 mg/dL Final  12/04/2015 09:18 AM 2.63* 0.57 - 1.00 mg/dL Final  08/28/2015 08:59 AM 1.58* 0.57 - 1.00 mg/dL Final  08/23/2015 05:56 AM 2.43* 0.44 - 1.00 mg/dL Final  08/22/2015 07:06 AM 2.73* 0.44 - 1.00 mg/dL Final  08/21/2015 02:21 AM 2.88* 0.44 - 1.00 mg/dL Final  05/18/2015 03:08 PM 2.11* 0.57 - 1.00 mg/dL Final  05/06/2015 09:06 AM 1.95* 0.57 - 1.00 mg/dL Final  01/26/2015 09:17 AM 1.91* 0.57 - 1.00 mg/dL Final  10/22/2014 09:02 AM 2.04* 0.57 - 1.00 mg/dL Final  07/14/2014 09:28 AM 1.72* 0.57 - 1.00 mg/dL Final  03/24/2014 11:12 AM 2.00* 0.57 - 1.00 mg/dL Final   11/22/2013 03:06 PM 2.14* 0.57 - 1.00 mg/dL Final  11/04/2013 09:36 AM 2.14* 0.57 - 1.00 mg/dL Final  08/02/2013 08:50 AM 1.87* 0.57 - 1.00 mg/dL Final  05/01/2013 10:42 AM 1.94* 0.50 - 1.10 mg/dL Final  08/24/2011 10:19 AM 1.15* 0.50 - 1.10 mg/dL Final  02/05/2010 05:45 AM 1.12 0.4 - 1.2 mg/dL Final    PMH:   Past Medical History  Diagnosis Date  . Chronic renal insufficiency   . Hypertension   . Hypokalemia   . DM (diabetes mellitus) ([place])   . Myocardial infarction [facility]: [facility])     NSTEMI/DES LAD, 02/2010  . Shortness of breath   . Cancer ([place])     skin cancer  . Chronic diastolic heart failure (HCC)     EF 60-65%, echo, 02/2012  . Valvular heart disease     Moderate MR; severe TR, echo, 02/2012  . Pulmonary hypertension (HCC)     Severe (RVSP 63 mmHg), echo, 02/2012  . CAD (coronary artery disease)   . Carotid artery disease (HCC)     0000000 RICA; [id number] LICA, [id number]  . HLD (hyperlipidemia)   . Atrial fibrillation ([place])   . Peripheral edema     PSH:   Past Surgical History  Procedure Laterality Date  . Total abdominal hysterectomy    . Cholecystectomy    . Cardiac catheterization      with  stent 2011  . Coronary stent placement  2011  . Cataract extraction w/phaco  08/29/2011    Procedure: CATARACT EXTRACTION PHACO AND INTRAOCULAR LENS PLACEMENT (IOC);  Surgeon: Tonny Branch;  Location: [facility];  Service: Ophthalmology;  Laterality: Right;  CDE: 10.25  . Eye surgery  Sept. 2012    right KPE w/ IOL  . Cataract extraction w/phaco  09/08/2011    Procedure: CATARACT EXTRACTION PHACO AND INTRAOCULAR LENS PLACEMENT (IOC);  Surgeon: Tonny Branch;  Location: [facility];  Service: Ophthalmology;  Laterality: Left;  CDE: 10.77  . Skin cancer excision      multiple    Allergies: No Known Allergies  Medications:   Prior to Admission medications   Medication Sig Start Date End Date Taking? Authorizing Provider  aspirin EC 81 MG tablet Take 1 tablet (81 mg total) by mouth daily. 07/22/14  Yes  Herminio Commons, MD  atorvastatin (LIPITOR) 40 MG tablet TAKE ONE (1) TABLET EACH DAY Patient taking differently: Take 40 mg by mouth daily at 6 PM. TAKE ONE (1) TABLET EACH DAY 12/04/15  Yes Mary-Margaret Hassell Done, FNP  cholecalciferol (VITAMIN D) 1000 UNITS tablet Take 1,000 Units by mouth daily.   Yes Historical Provider, MD  ciprofloxacin (CIPRO) 500 MG tablet Take 1 tablet (500 mg total) by mouth 2 (two) times daily. 12/29/15  Yes Orvan Falconer, MD  clopidogrel (PLAVIX) 75 MG tablet TAKE ONE (1) TABLET EACH DAY 12/04/15  Yes Mary-Margaret Hassell Done, FNP  COLCRYS 0.6 MG tablet Take 0.5 tablets (0.3 mg total) by mouth daily. Patient taking differently: Take 0.3 mg by mouth every other day.  05/18/15  Yes Claretta Fraise, MD  feeding supplement, GLUCERNA SHAKE, (GLUCERNA SHAKE) LIQD Take 237 mLs by mouth 2 (two) times daily between meals. Patient taking differently: Take 237 mLs by mouth daily as needed (nutrional supplement).  08/23/15  Yes Debbe Odea, MD  fenofibrate micronized (LOFIBRA) 134 MG capsule Take 1 capsule (134 mg total) by mouth daily before breakfast. Patient taking differently: Take 134 mg by mouth at bedtime.  10/16/15  Yes Mary-Margaret Hassell Done, FNP  glimepiride (AMARYL) 4 MG tablet Take 0.5 tablets (2 mg total) by mouth daily. 12/04/15  Yes Mary-Margaret Hassell Done, FNP  hydrALAZINE (APRESOLINE) 25 MG tablet Take 1 tablet (25 mg total) by mouth every 8 (eight) hours. 12/29/15  Yes Orvan Falconer, MD  LANTUS SOLOSTAR 100 UNIT/ML Solostar Pen Inject 10 Units into the skin daily at 10 pm. 12/04/15  Yes Mary-Margaret Hassell Done, FNP  lisinopril (PRINIVIL,ZESTRIL) 20 MG tablet Take 20 mg by mouth 2 (two) times daily.   Yes Historical Provider, MD  metoCLOPramide (REGLAN) 10 MG tablet Take 5 mg by mouth daily as needed for nausea or vomiting. Reported on 12/04/2015 08/07/15  Yes Historical Provider, MD  metroNIDAZOLE (FLAGYL) 500 MG tablet Take 1 tablet (500 mg total) by mouth every 8 (eight) hours. 12/29/15   Yes Orvan Falconer, MD  nitroGLYCERIN (NITROSTAT) 0.4 MG SL tablet Place 1 tablet (0.4 mg total) under the tongue every 5 (five) minutes x 3 doses as needed. Patient taking differently: Place 0.4 mg under the tongue every 5 (five) minutes x 3 doses as needed for chest pain.  07/18/14  Yes Herminio Commons, MD  omeprazole (PRILOSEC) 40 MG capsule TAKE ONE (1) CAPSULE EACH DAY 11/12/15  Yes Mary-Margaret Hassell Done, FNP  potassium chloride (K-DUR) 10 MEQ tablet Take 1 tablet (10 mEq total) by mouth 2 (two) times daily. 12/04/15  Yes Mary-Margaret Hassell Done, FNP  torsemide (DEMADEX) 20  MG tablet Take 1 tablet (20 mg total) by mouth 2 (two) times daily as needed. Swelling; Some days, daughter gives her 2 tablets every other day Patient taking differently: Take 20 mg by mouth 2 (two) times daily. Swelling; Some days, daughter gives her 2 tablets every other day 12/04/15  Yes Mary-Margaret Hassell Done, FNP  TRADJENTA 5 MG TABS tablet TAKE ONE (1) TABLET EACH DAY Patient taking differently: Take 5 mg by mouth daily at bedtime 10/09/15  Yes Mary-Margaret Hassell Done, FNP  UNIFINE PENTIPS 32G X 4 MM MISC EVERY DAY 02/23/15   Mary-Margaret Hassell Done, FNP    Inpatient medications: . aspirin EC  81 mg Oral Daily  . atorvastatin  40 mg Oral q1800  . clopidogrel  75 mg Oral Daily  . insulin aspart  0-15 Units Subcutaneous TID WC  . insulin aspart  0-5 Units Subcutaneous QHS  . insulin glargine  10 Units Subcutaneous Q2200  . metroNIDAZOLE  500 mg Oral 3 times per day  . pantoprazole  40 mg Oral Daily  . sodium chloride flush  3 mL Intravenous Q12H    Discontinued Meds:   Medications Discontinued During This Encounter  Medication Reason  . enoxaparin (LOVENOX) injection 30 mg   . insulin aspart (novoLOG) injection 0-9 Units   . colchicine tablet 0.6 mg   . metroNIDAZOLE (FLAGYL) IVPB 500 mg   . colchicine tablet 0.3 mg   . 0.9 %  sodium chloride infusion   . predniSONE (DELTASONE) tablet 50 mg     Social History:  reports  that she has never smoked. She has never used smokeless tobacco. She reports that she does not drink alcohol or use illicit drugs.  Family History:   Family History  Problem Relation Age of Onset  . Coronary artery disease Neg Hx   . Anesthesia problems Neg Hx   . Hypotension Neg Hx   . Malignant hyperthermia Neg Hx   . Pseudochol deficiency Neg Hx   . CAD Mother   . Heart attack Father   . Cancer Brother     colon    Pertinent items are noted in HPI. Weight change:   Intake/Output Summary (Last 24 hours) at 01/01/16 1105 Last data filed at 01/01/16 0914  Gross per 24 hour  Intake 1712.5 ml  Output    800 ml  Net  912.5 ml   BP 158/50 mmHg  Pulse 85  Temp(Src) 97.7 F (36.5 C) (Oral)  Resp 18  Ht 5\' 3"  (1.6 m)  Wt 58.196 kg (128 lb 4.8 oz)  BMI 22.73 kg/m2  SpO2 98% Filed Vitals:   12/31/15 0759 12/31/15 1330 01/01/16 0001 01/01/16 0503  BP: 127/39 151/45 157/44 158/50  Pulse: 77 64 88 85  Temp: 98.5 F (36.9 C) 97.5 F (36.4 C) 97.8 F (36.6 C) 97.7 F (36.5 C)  TempSrc: Oral Oral Oral Oral  Resp: 18 17 18 18   Height:      Weight: 57.289 kg (126 lb 4.8 oz)   58.196 kg (128 lb 4.8 oz)  SpO2: 97% 99% 98% 98%     General appearance: fatigued, no distress and pale Head: Normocephalic, without obvious abnormality, atraumatic Eyes: negative findings: lids and lashes normal, conjunctivae and sclerae normal and corneas clear Neck: no adenopathy, no carotid bruit, no JVD, supple, symmetrical, trachea midline and thyroid not enlarged, symmetric, no tenderness/mass/nodules Resp: clear to auscultation bilaterally Cardio: faint HS, RRR, no rub GI: soft, non-tender; bowel sounds normal; no masses,  no organomegaly Extremities: edema  1+ edema of upper and lower extremities  Labs: Basic Metabolic Panel:  Recent Labs Lab 12/26/15 0559 12/27/15 0825 12/28/15 0850 12/29/15 1730 12/30/15 0606 12/31/15 0451 01/01/16 0441  NA 139 137 135 133* 132* 134* 135  K 3.4*  3.8 4.4 5.5* 5.0 4.9 5.0  CL 112* 110 109 106 107 109 113*  CO2 21* 19* 18* 16* 16* 16* 14*  GLUCOSE 102* 96 173* 186* 191* 183* 137*  BUN 45* 41* 44* 52* 58* 67* 89*  CREATININE 2.32* 2.45* 2.65* 2.90* 2.82* 2.99* 3.12*  ALBUMIN  --  2.0*  --   --  1.6*  --   --   CALCIUM 7.9* 8.1* 7.9* 8.8* 8.2* 8.2* 7.8*  PHOS  --   --   --   --  5.5*  --   --    Liver Function Tests:  Recent Labs Lab 12/27/15 0825 12/30/15 0606  AST 30 36  ALT 15 16  ALKPHOS 60 68  BILITOT 0.6 0.6  PROT 5.2* 5.0*  ALBUMIN 2.0* 1.6*   No results for input(s): LIPASE, AMYLASE in the last 168 hours. No results for input(s): AMMONIA in the last 168 hours. CBC:  Recent Labs Lab 12/27/15 0825 12/28/15 0850 12/29/15 1730 12/30/15 0606 12/31/15 0451 01/01/16 0441  WBC 19.2* 14.9* 16.6* 10.4 13.5* 15.8*  NEUTROABS 16.1* 11.1*  --   --   --   --   HGB 8.2* 7.7* 8.3* 7.6* 8.2* 7.8*  HCT 26.6* 24.6* 27.7* 24.7* 26.9* 25.3*  MCV 87.2 86.3 84.7 84.9 84.1 83.2  PLT 361 327 419* 411* 511* 518*   PT/INR: @LABRCNTIP (inr:5) Cardiac Enzymes: )No results for input(s): CKTOTAL, CKMB, CKMBINDEX, TROPONINI in the last 168 hours. CBG:  Recent Labs Lab 12/30/15 2209 12/31/15 0654 12/31/15 1651 12/31/15 2146 01/01/16 0624  GLUCAP 207* 148* 164* 131* 122*    Iron Studies:  Recent Labs Lab 12/30/15 0606  IRON 8*  TIBC 223*  FERRITIN 66    Xrays/Other Studies: No results found.  Assessment/Plan: 1. AKI/CKD stage 3-4- in setting of C. Diff colitis and low FeNa.  Underlying CKD likely related to DM and HTN.  Has been chronic and progressive but has not seen a Nephrologist locally.  She is not a suitable candidate for HD due to her advanced age, debilitated state, and multiple, irreversible co-morbidities.  I discussed this with the patient and her daughter and both are in agreement that she would no benefit from renal replacement therapy if needed and would get hospice involved at that time.   1.  Agree  with IVF's and follow UOP and daily Scr. 2. Hyperkalemia- will follow after IVF's 3. C. Diff Colitis- on empiric flagyl, + C diff Ag on 12/29/15.    4. HTN- stable 5. DM - stable per primary svc 6. CAD- stable 7. P Afib- stable 8. Severe pulmonary HTN- likely contributing to her edema.  Continue to follow 9. Foot pain- improving.  Check uric acid level and xrays per primary 10. Anemia of chronic disease- will dose with IV Iron and would likely benefit from ESA therapy from a local nephrologist or oncologist 11. ASCVD- stable 12. Severe protein malnutrition- per primary svc   Donetta Potts 01/01/2016, 11:05 AM

## 2016-01-02 DIAGNOSIS — A047 Enterocolitis due to Clostridium difficile: Secondary | ICD-10-CM

## 2016-01-02 LAB — GLUCOSE, CAPILLARY
GLUCOSE-CAPILLARY: 84 mg/dL (ref 65–99)
GLUCOSE-CAPILLARY: 71 mg/dL (ref 65–99)
GLUCOSE-CAPILLARY: 111 mg/dL — AB (ref 65–99)
Glucose-Capillary: 50 mg/dL — ABNORMAL LOW (ref 65–99)
Glucose-Capillary: 103 mg/dL — ABNORMAL HIGH (ref 65–99)

## 2016-01-02 LAB — RENAL FUNCTION PANEL
ANION GAP: 8 (ref 5–15)
Albumin: 1.6 g/dL — ABNORMAL LOW (ref 3.5–5.0)
BUN: 90 mg/dL — ABNORMAL HIGH (ref 6–20)
CALCIUM: 8.1 mg/dL — AB (ref 8.9–10.3)
CHLORIDE: 118 mmol/L — AB (ref 101–111)
CO2: 16 mmol/L — AB (ref 22–32)
Creatinine, Ser: 2.76 mg/dL — ABNORMAL HIGH (ref 0.44–1.00)
GFR calc non Af Amer: 15 mL/min — ABNORMAL LOW (ref >60)
GFR, EST AFRICAN AMERICAN: 17 mL/min — AB (ref >60)
GLUCOSE: 59 mg/dL — AB (ref 65–99)
POTASSIUM: 4.5 mmol/L (ref 3.5–5.1)
Phosphorus: 3.8 mg/dL (ref 2.5–4.6)
SODIUM: 142 mmol/L (ref 135–145)

## 2016-01-02 LAB — CBC
HCT: 25 % — ABNORMAL LOW (ref 36.0–46.0)
Hemoglobin: 7.8 g/dL — ABNORMAL LOW (ref 12.0–15.0)
MCH: 25.9 pg — AB (ref 26.0–34.0)
MCHC: 31.2 g/dL (ref 30.0–36.0)
MCV: 83.1 fL (ref 78.0–100.0)
Platelets: 446 10*3/uL — ABNORMAL HIGH (ref 150–400)
RBC: 3.01 MIL/uL — AB (ref 3.87–5.11)
RDW: 17.3 % — AB (ref 11.5–15.5)
WBC: 13.1 10*3/uL — ABNORMAL HIGH (ref 4.0–10.5)

## 2016-01-02 LAB — URIC ACID: Uric Acid, Serum: 11.8 mg/dL — ABNORMAL HIGH (ref 2.3–6.6)

## 2016-01-02 MED ORDER — STERILE WATER FOR INJECTION IV SOLN
150.0000 meq | INTRAVENOUS | Status: AC
  Administered 2016-01-02: 150 meq via INTRAVENOUS
  Filled 2016-01-02 (×2): qty 850

## 2016-01-02 NOTE — Clinical Social Work Note (Signed)
Clinical Social Work Assessment  Patient Details  Name: Robin [name] MRN: [id number] Date of Birth: 07/19/1928  Date of referral:  01/02/16               Reason for consult:  Facility Placement                Permission sought to share information with:  Case Manager, Facility Contact Representative, Family Supports Permission granted to share information::  Yes, Verbal Permission Granted  Name::        Agency::  SNF facilities  Relationship::  daughter: Robin [name] (at bedside with patient)  Contact Information:     Housing/Transportation Living arrangements for the past 2 months:  Single Family Home Source of Information:  Patient, Adult Children Patient Interpreter Needed:  None Criminal Activity/Legal Involvement Pertinent to Current Situation/Hospitalization:  No - Comment as needed Significant Relationships:  Adult Children, Other Family Members Lives with:  Adult Children Do you feel safe going back to the place where you live?  No (due to current physical condition, needing higher level of care) Need for family participation in patient care:  Yes (Comment) (per request, but patient ia alert and oriented and able to make own decisions)  Care giving concerns:  Daughter voices patient needing rehab ([facility]) at DC due to current function level and needs. Prior to hospital patient was independent with family support with ambulation, cooking, cleaning, and taking care of self.  Pt working to get back to baseline prior to admission.   Social Worker assessment / plan:  LCSW met with patient and daughter at the bedside, explained role and plan for DC. Agreeable for SNF, requesting [facility] due to family location and other family members who have been in SNF previously. Daughter involved in care and finances asking questions that were all answered regarding insurance. No barriers and permission given to begin referral process for SNF. LCSW completed FL2 and passar  for patient. Faxed to [place] for placement per pt request Will have week day CSW follow up for anticipated DC Monday/Tuesday. Family and pt aware and agreeable.  Employment status:  Retired Insurance information:  Medicare PT Recommendations:  Skilled Nursing Facility Information / Referral to community resources:  Skilled Nursing Facility  Patient/Family's Response to care:  Agreeable  Patient/Family's Understanding of and Emotional Response to Diagnosis, Current Treatment, and Prognosis:  Family and patient hopful for DC early next week as patient continues to make positive progress per Daughter report with medical condition.  No barriers or problems with DC plan. Very understanding and engaged/motivated for ST SNF.  Emotional Assessment Appearance:  Appears stated age Attitude/Demeanor/Rapport:  Other (calm and cooperative, eating lunch ) Affect (typically observed):  Accepting, Adaptable Orientation:  Oriented to Self, Oriented to Place, Oriented to  Time, Oriented to Situation Alcohol / Substance use:  Not Applicable Psych involvement (Current and /or in the community):  No (Comment)  Discharge Needs  Concerns to be addressed:  Financial / Insurance Concerns Readmission within the last 30 days:  Yes Current discharge risk:  None Barriers to Discharge:  Continued Medical Work up   ,  N, LCSW 01/02/2016, 1:02 PM  

## 2016-01-02 NOTE — Progress Notes (Signed)
Patient ID: Robin [name], female   DOB: Mar 17, 1928, 82 y.o.   MRN: [id number] S:feels better O:BP 143/50 mmHg  Pulse 86  Temp(Src) 98 F (36.7 C) (Oral)  Resp 18  Ht 5\' 3"  (1.6 m)  Wt 58.242 kg (128 lb 6.4 oz)  BMI 22.75 kg/m2  SpO2 98%  Intake/Output Summary (Last 24 hours) at 01/02/16 0858 Last data filed at 01/02/16 0843  Gross per 24 hour  Intake 1924.5 ml  Output    802 ml  Net 1122.5 ml   Intake/Output: I/O last 3 completed shifts: In: 2448.3 [P.O.:1057; I.V.:1391.3] Out: 1102 [Urine:1100; Stool:2]  Intake/Output this shift:  Total I/O In: 120 [P.O.:120] Out: -  Weight change: 0.953 kg (2 lb 1.6 oz) Gen:WD elderly WF in NAd CVS:no rub Resp:cta [id number] Ext:1+ edema   Recent Labs Lab 12/27/15 0825 12/28/15 0850 12/29/15 1730 12/30/15 0606 12/31/15 0451 01/01/16 0441 01/02/16 0339  NA 137 135 133* 132* 134* 135 142  K 3.8 4.4 5.5* 5.0 4.9 5.0 4.5  CL 110 109 106 107 109 113* 118*  CO2 19* 18* 16* 16* 16* 14* 16*  GLUCOSE 96 173* 186* 191* 183* 137* 59*  BUN 41* 44* 52* 58* 67* 89* 90*  CREATININE 2.45* 2.65* 2.90* 2.82* 2.99* 3.12* 2.76*  ALBUMIN 2.0*  --   --  1.6*  --   --  1.6*  CALCIUM 8.1* 7.9* 8.8* 8.2* 8.2* 7.8* 8.1*  PHOS  --   --   --  5.5*  --   --  3.8  AST 30  --   --  36  --   --   --   ALT 15  --   --  16  --   --   --    Liver Function Tests:  Recent Labs Lab 12/27/15 0825 12/30/15 0606 01/02/16 0339  AST 30 36  --   ALT 15 16  --   ALKPHOS 60 68  --   BILITOT 0.6 0.6  --   PROT 5.2* 5.0*  --   ALBUMIN 2.0* 1.6* 1.6*   No results for input(s): LIPASE, AMYLASE in the last 168 hours. No results for input(s): AMMONIA in the last 168 hours. CBC:  Recent Labs Lab 12/27/15 0825 12/28/15 0850 12/29/15 1730 12/30/15 0606 12/31/15 0451 01/01/16 0441 01/02/16 0339  WBC 19.2* 14.9* 16.6* 10.4 13.5* 15.8* 13.1*  NEUTROABS 16.1* 11.1*  --   --   --   --   --   HGB 8.2* 7.7* 8.3* 7.6* 8.2* 7.8* 7.8*  HCT 26.6* 24.6* 27.7*  24.7* 26.9* 25.3* 25.0*  MCV 87.2 86.3 84.7 84.9 84.1 83.2 83.1  PLT 361 327 419* 411* 511* 518* 446*   Cardiac Enzymes: No results for input(s): CKTOTAL, CKMB, CKMBINDEX, TROPONINI in the last 168 hours. CBG:  Recent Labs Lab 01/01/16 1152 01/01/16 1651 01/01/16 2056 01/02/16 0548 01/02/16 0624  GLUCAP 138* 108* 78 50* 84    Iron Studies: No results for input(s): IRON, TIBC, TRANSFERRIN, FERRITIN in the last 72 hours. Studies/Results: No results found. Marland Kitchen aspirin EC  81 mg Oral Daily  . atorvastatin  40 mg Oral q1800  . clopidogrel  75 mg Oral Daily  . insulin aspart  0-15 Units Subcutaneous TID WC  . insulin aspart  0-5 Units Subcutaneous QHS  . insulin glargine  10 Units Subcutaneous Q2200  . metroNIDAZOLE  500 mg Oral 3 times per day  . pantoprazole  40 mg Oral Daily  . sodium  chloride flush  3 mL Intravenous Q12H    BMET    Component Value Date/Time   NA 142 01/02/2016 0339   NA 145* 12/04/2015 0918   K 4.5 01/02/2016 0339   CL 118* 01/02/2016 0339   CO2 16* 01/02/2016 0339   GLUCOSE 59* 01/02/2016 0339   GLUCOSE 154* 12/04/2015 0918   BUN 90* 01/02/2016 0339   BUN 66* 12/04/2015 0918   CREATININE 2.76* 01/02/2016 0339   CREATININE 1.94* 05/01/2013 1042   CALCIUM 8.1* 01/02/2016 0339   GFRNONAA 15* 01/02/2016 0339   GFRNONAA 23* 05/01/2013 1042   GFRAA 17* 01/02/2016 0339   GFRAA 27* 05/01/2013 1042   CBC    Component Value Date/Time   WBC 13.1* 01/02/2016 0339   WBC 5.4 05/18/2015 1513   RBC 3.01* 01/02/2016 0339   RBC 2.91* 12/30/2015 0606   RBC 4.11 05/18/2015 1513   HGB 7.8* 01/02/2016 0339   HGB 11.6* 05/18/2015 1513   HCT 25.0* 01/02/2016 0339   HCT 37.2* 05/18/2015 1513   PLT 446* 01/02/2016 0339   MCV 83.1 01/02/2016 0339   MCV 90.4 05/18/2015 1513   MCH 25.9* 01/02/2016 0339   MCH 28.1 05/18/2015 1513   MCHC 31.2 01/02/2016 0339   MCHC 31.1* 05/18/2015 1513   RDW 17.3* 01/02/2016 0339   LYMPHSABS 1.2 12/28/2015 0850   MONOABS 2.3*  12/28/2015 0850   EOSABS 0.3 12/28/2015 0850   BASOSABS 0.1 12/28/2015 0850     Assessment/Plan: 1. AKI/CKD stage 3-4- in setting of C. Diff colitis and low FeNa. Underlying CKD likely related to DM and HTN. Has been chronic and progressive but has not seen a Nephrologist locally. She is not a suitable candidate for HD due to her advanced age, debilitated state, and multiple, irreversible co-morbidities. I discussed this with the patient and her daughter and both are in agreement that she would no benefit from renal replacement therapy if needed and would get hospice involved at that time.  1. Improving with IVF's 2. Continue to follow UOP and daily Scr. 2. Hyperkalemia- will follow after IVF's 3. Metabolic acidosis- due to #1.  will change IVF's to isotonic bicarb and follow 4. C. Diff Colitis- on empiric flagyl, + C diff Ag on 12/29/15.  5. HTN- stable 6. DM - stable per primary svc 7. CAD- stable 8. P Afib- stable 9. Severe pulmonary HTN- likely contributing to her edema. Continue to follow 10. Foot pain- improving. Check uric acid level and xrays per primary 11. Anemia of chronic disease- will dose with IV Iron and would likely benefit from ESA therapy from a local nephrologist or oncologist 12. ASCVD- stable 13. Severe protein malnutrition- per primary svc 14. Disposition- wants to go to rehab facility closer to home  Donetta Potts

## 2016-01-02 NOTE — NC FL2 (Signed)
[place] MEDICAID FL2 LEVEL OF CARE SCREENING TOOL     IDENTIFICATION  Patient Name: Robin [name] Birthdate: 1928/11/23 Sex: female Admission Date (Current Location): 12/29/2015  [facility] and [place] Number:  Herbalist and Address:  [facility] [place], Doctor Phillips [address], [place], [place] [address]      Provider Number: [id number]  Attending Physician Name and Address:  Geradine Girt, DO  Relative Name and Phone Number:       Current Level of Care: Hospital Recommended Level of Care: Nursing Facility Prior Approval Number:    Date Approved/Denied:   PASRR Number: [id number]  Discharge Plan: SNF    Current Diagnoses: Patient Active Problem List   Diagnosis Date Noted  . Hyperkalemia 12/29/2015  . Debility 12/29/2015  . Dehydration 12/29/2015  . Prolonged QT interval 12/29/2015  . Abdominal distention   . Acute gout   . Colitis 12/24/2015  . Acute on chronic kidney failure ([place]) 12/24/2015  . Cerebral thrombosis with cerebral infarction ([place]) 08/23/2015  . Anemia, iron deficiency 08/23/2015  . Generalized weakness 08/21/2015  . Headache 08/21/2015  . Pituitary mass ([place]) 08/21/2015  . Anemia in CKD (chronic kidney disease) 08/21/2015  . Cephalalgia   . Essential hypertension 10/22/2014  . Diabetes mellitus type 2 with complications ([place]) [id number]  . Valvular heart disease   . CKD (chronic kidney disease), stage IV ([place])   . Peripheral edema 06/22/2012  . Atrial fibrillation ([place]) 05/17/2012  . Chronic diastolic heart failure ([place]) 03/08/2012  . Mitral regurgitation 11/21/2011  . Claudication in peripheral vascular disease ([place]) 05/24/2011  . Hyperlipemia 03/22/2011  . Vitamin D deficiency 03/22/2011  . Hypokalemia 03/03/2010  . HTN CKD UNS W/CKD STAGE I THRU STAGE IV/UNS 03/03/2010  . ACUT MI SUBENDOCARDIAL INFARCT INIT EPIS CARE 03/03/2010  . CORONARY ATHEROSCLEROSIS NATIVE CORONARY ARTERY 03/03/2010  . OTHER PULMONARY  INSUFFICIENCY NEC 03/03/2010  . Carotid bruit 03/03/2010  . OTHER DYSPNEA AND RESPIRATORY ABNORMALITIES 03/03/2010    Orientation RESPIRATION BLADDER Height & Weight    Self, Time, Situation, Place  Normal Continent   128 lbs.  BEHAVIORAL SYMPTOMS/MOOD NEUROLOGICAL BOWEL NUTRITION STATUS   (Calm and Cooperative No behavioral Issues)   Continent Diet (heart healthy)  AMBULATORY STATUS COMMUNICATION OF NEEDS Skin   Extensive Assist Verbally Normal                       Personal Care Assistance Level of Assistance  Bathing, Feeding, Dressing Bathing Assistance: Limited assistance Feeding assistance: Independent Dressing Assistance: Limited assistance     Functional Limitations Info  Sight, Hearing, Speech Sight Info: Adequate Hearing Info: Adequate Speech Info: Adequate    SPECIAL CARE FACTORS FREQUENCY  PT (By licensed PT), OT (By licensed OT)     PT Frequency: 3x OT Frequency: 3x            Contractures      Additional Factors Info  Code Status, Allergies, Psychotropic, Insulin Sliding Scale, Isolation Precautions Code Status Info: Full Code Allergies Info: NKA Psychotropic Info: none Insulin Sliding Scale Info: 4x Isolation Precautions Info: none     Current Medications (01/02/2016):  This is the current hospital active medication list Current Facility-Administered Medications  Medication Dose Route Frequency Provider Last Rate Last Dose  . acetaminophen (TYLENOL) tablet 650 mg  650 mg Oral Q6H PRN Toy Baker, MD       Or  . acetaminophen (TYLENOL) suppository 650 mg  650 mg Rectal  Q6H PRN Toy Baker, MD      . aspirin EC tablet 81 mg  81 mg Oral Daily Toy Baker, MD   81 mg at 01/02/16 1028  . atorvastatin (LIPITOR) tablet 40 mg  40 mg Oral q1800 Toy Baker, MD   40 mg at 01/01/16 1828  . clopidogrel (PLAVIX) tablet 75 mg  75 mg Oral Daily Toy Baker, MD   75 mg at 01/02/16 1028  . haloperidol lactate (HALDOL)  injection 2 mg  2 mg Intravenous Q6H PRN Geradine Girt, DO   2 mg at 12/31/15 1501  . HYDROcodone-acetaminophen (NORCO/VICODIN) 5-325 MG per tablet 1-2 tablet  1-2 tablet Oral Q4H PRN Toy Baker, MD   2 tablet at 12/30/15 2343  . insulin aspart (novoLOG) injection 0-15 Units  0-15 Units Subcutaneous TID WC Geradine Girt, DO   2 Units at 01/01/16 1230  . insulin aspart (novoLOG) injection 0-5 Units  0-5 Units Subcutaneous QHS Geradine Girt, DO   2 Units at 12/30/15 2300  . metroNIDAZOLE (FLAGYL) tablet 500 mg  500 mg Oral 3 times per day Geradine Girt, DO   500 mg at 01/02/16 0600  . ondansetron (ZOFRAN) tablet 4 mg  4 mg Oral Q6H PRN Toy Baker, MD   4 mg at 12/29/15 2351   Or  . ondansetron (ZOFRAN) injection 4 mg  4 mg Intravenous Q6H PRN Toy Baker, MD      . oxymetazoline (AFRIN) 0.05 % nasal spray 2 spray  2 spray Each Nare BID PRN Rhetta Mura Schorr, NP      . pantoprazole (PROTONIX) EC tablet 40 mg  40 mg Oral Daily Toy Baker, MD   40 mg at 01/02/16 1028  . sodium bicarbonate 150 mEq in sterile water 1,000 mL infusion  150 mEq Intravenous Continuous Donato Heinz, MD 50 mL/hr at 01/02/16 1029 150 mEq at 01/02/16 1029  . sodium chloride flush (NS) 0.9 % injection 3 mL  3 mL Intravenous Q12H Toy Baker, MD   3 mL at 01/01/16 2229  . traMADol (ULTRAM) tablet 50 mg  50 mg Oral Q12H PRN Geradine Girt, DO         Discharge Medications: Please see discharge summary for a list of discharge medications.  Relevant Imaging Results:  Relevant Lab Results:   Additional Information SSN:  [id number]  [place]illy [place], [place]

## 2016-01-02 NOTE — Clinical Social Work Placement (Signed)
   CLINICAL SOCIAL WORK PLACEMENT  NOTE  Date:  01/02/2016  Patient Details  Name: Robin [name] MRN: [id number] Date of Birth: 11-21-1928  Clinical Social Work is seeking post-discharge placement for this patient at the [place] level of care (*CSW will initial, date and re-position this form in  chart as items are completed):  Yes   Patient/family provided with [facility] Work Department's list of facilities offering this level of care within the geographic area requested by the patient (or if unable, by the patient's family).  Yes   Patient/family informed of their freedom to choose among providers that offer the needed level of care, that participate in Medicare, Medicaid or managed care program needed by the patient, have an available bed and are willing to accept the patient.  Yes   Patient/family informed of [facility]'s ownership interest in [facility] and [facility], as well as of the fact that they are under no obligation to receive care at these facilities.  PASRR submitted to EDS on 01/02/16     PASRR number received on 01/02/16     Existing PASRR number confirmed on       FL2 transmitted to all facilities in geographic area requested by pt/family on 01/02/16     FL2 transmitted to all facilities within larger geographic area on       Patient informed that his/her managed care company has contracts with or will negotiate with certain facilities, including the following:            Patient/family informed of bed offers received.  Patient chooses bed at       Physician recommends and patient chooses bed at      Patient to be transferred to   on  .  Patient to be transferred to facility by       Patient family notified on   of transfer.  Name of family member notified:        PHYSICIAN Please sign FL2     Additional Comment:    _______________________________________________ Lilly [place], LCSW 01/02/2016, 1:14  PM

## 2016-01-02 NOTE — Progress Notes (Signed)
PROGRESS NOTE  Robin [name] [id number] DOB: [birth year] DOA: 12/29/2015 PCP: Chevis Pretty, FNP  82 year old female with history of chronic kidney disease, diabetes, coronary artery disease is just recently admitted for colitis was treated with Flagyl and Cipro while hospitalized develop was thought to be gout and was treated with colchicine discharged to home unable to tolerate weightbearing secondary to pain and was brought back was found to have worsening renal function and hyperkalemia  Assessment/Plan: Acute on chronic kidney failure ([place]) -  -possibly secondary to decreased by mouth intake.  -hold torsemide  -gentle fluids, stop lisinopril  -stop gout meds  Anemia in CKD (chronic kidney disease)  -transfuse for < 7   Atrial fibrillation (HCC) - patient on Plavix not on anticoagulation as has refused per previous notes by cardiology CHA2D Vasc score 7  Chronic diastolic heart failure (HCC) -  - hold torsemide for now will need to restart when able  Colitis/diarrhea -  -will treat for presumed c diff -stools per patient are becoming more firm  Diabetes mellitus type 2 with complications ([place]) -SSI -lantus given  Essential hypertension continue home medications  Hyperkalemia: -stop lisinopril and Potassium administer gentle fluids  Debility -SNF  Gout-  -stop steroids as patient was becoming confused -stop colchine -uric acid elevated-- ? Due to CKD -? Arthritis-- will give ultram as well  Dehydration hold torsemide give gentle fluids   H/o pituitary apoplexy   Code Status: full Family Communication: patient/daughter Disposition Plan: SNF (suspect Monday/Tuesday)   Consultants:    Procedures:     HPI/Subjective: Feeling better today Brother says she ate better  Objective: Filed Vitals:   01/01/16 2007 01/02/16 0347  BP: 136/55 143/50  Pulse: 76 86  Temp: 97.3 F (36.3 C) 98 F (36.7 C)  Resp: 18 18    Intake/Output Summary  (Last 24 hours) at 01/02/16 1208 Last data filed at 01/02/16 0843  Gross per 24 hour  Intake    952 ml  Output    802 ml  Net    150 ml   Filed Weights   12/31/15 0759 01/01/16 0503 01/02/16 0347  Weight: 57.289 kg (126 lb 4.8 oz) 58.196 kg (128 lb 4.8 oz) 58.242 kg (128 lb 6.4 oz)    Exam:   General:  Awake, NAD  Abdomen: +BS, soft  Musculoskeletal: min edema left ankle- no longer tender, left arm swollen  Data Reviewed: Basic Metabolic Panel:  Recent Labs Lab 12/29/15 1730 12/30/15 0606 12/31/15 0451 01/01/16 0441 01/02/16 0339  NA 133* 132* 134* 135 142  K 5.5* 5.0 4.9 5.0 4.5  CL 106 107 109 113* 118*  CO2 16* 16* 16* 14* 16*  GLUCOSE 186* 191* 183* 137* 59*  BUN 52* 58* 67* 89* 90*  CREATININE 2.90* 2.82* 2.99* 3.12* 2.76*  CALCIUM 8.8* 8.2* 8.2* 7.8* 8.1*  MG  --  1.8  --   --   --   PHOS  --  5.5*  --   --  3.8   Liver Function Tests:  Recent Labs Lab 12/27/15 0825 12/30/15 0606 01/02/16 0339  AST 30 36  --   ALT 15 16  --   ALKPHOS 60 68  --   BILITOT 0.6 0.6  --   PROT 5.2* 5.0*  --   ALBUMIN 2.0* 1.6* 1.6*   No results for input(s): LIPASE, AMYLASE in the last 168 hours. No results for input(s): AMMONIA in the last 168 hours. CBC:  Recent Labs Lab 12/27/15 0825  12/28/15 0850 12/29/15 1730 12/30/15 0606 12/31/15 0451 01/01/16 0441 01/02/16 0339  WBC 19.2* 14.9* 16.6* 10.4 13.5* 15.8* 13.1*  NEUTROABS 16.1* 11.1*  --   --   --   --   --   HGB 8.2* 7.7* 8.3* 7.6* 8.2* 7.8* 7.8*  HCT 26.6* 24.6* 27.7* 24.7* 26.9* 25.3* 25.0*  MCV 87.2 86.3 84.7 84.9 84.1 83.2 83.1  PLT 361 327 419* 411* 511* 518* 446*   Cardiac Enzymes: No results for input(s): CKTOTAL, CKMB, CKMBINDEX, TROPONINI in the last 168 hours. BNP (last 3 results) No results for input(s): BNP in the last 8760 hours.  ProBNP (last 3 results) No results for input(s): PROBNP in the last 8760 hours.  CBG:  Recent Labs Lab 01/01/16 1651 01/01/16 2056 01/02/16 0548  01/02/16 0624 01/02/16 1112  GLUCAP 108* 78 50* 84 71    Recent Results (from the past 240 hour(s))  Blood culture (routine x 2)     Status: None   Collection Time: 12/24/15  8:43 PM  Result Value Ref Range Status   Specimen Description BLOOD RIGHT ANTECUBITAL  Final   Special Requests BOTTLES DRAWN AEROBIC AND ANAEROBIC 6CC  Final   Culture NO GROWTH 5 DAYS  Final   Report Status 12/29/2015 FINAL  Final  Blood culture (routine x 2)     Status: None   Collection Time: 12/24/15  9:05 PM  Result Value Ref Range Status   Specimen Description BLOOD RIGHT ANTECUBITAL  Final   Special Requests BOTTLES DRAWN AEROBIC AND ANAEROBIC 6CC  Final   Culture NO GROWTH 5 DAYS  Final   Report Status 12/29/2015 FINAL  Final  Urine culture     Status: None   Collection Time: 12/29/15  8:41 PM  Result Value Ref Range Status   Specimen Description URINE, CLEAN CATCH  Final   Special Requests NONE  Final   Culture MULTIPLE SPECIES PRESENT, SUGGEST RECOLLECTION  Final   Report Status 12/31/2015 FINAL  Final  C difficile quick scan w PCR reflex     Status: Abnormal   Collection Time: 12/30/15  3:06 PM  Result Value Ref Range Status   C Diff antigen POSITIVE (A) NEGATIVE Final   C Diff toxin NEGATIVE NEGATIVE Final   C Diff interpretation   Final    C. difficile present, but toxin not detected. This indicates colonization. In most cases, this does not require treatment. If patient has signs and symptoms consistent with colitis, consider treatment. Requires ENTERIC precautions.  Gastrointestinal Panel by PCR , Stool     Status: None   Collection Time: 01/01/16 11:52 AM  Result Value Ref Range Status   Campylobacter species NOT DETECTED NOT DETECTED Final   Plesimonas shigelloides NOT DETECTED NOT DETECTED Final   Salmonella species NOT DETECTED NOT DETECTED Final   Yersinia enterocolitica NOT DETECTED NOT DETECTED Final   Vibrio species NOT DETECTED NOT DETECTED Final   Vibrio cholerae NOT DETECTED  NOT DETECTED Final   Enteroaggregative E coli (EAEC) NOT DETECTED NOT DETECTED Final   Enteropathogenic E coli (EPEC) NOT DETECTED NOT DETECTED Final   Enterotoxigenic E coli (ETEC) NOT DETECTED NOT DETECTED Final   Shiga like toxin producing E coli (STEC) NOT DETECTED NOT DETECTED Final   E. coli O157 NOT DETECTED NOT DETECTED Final   Shigella/Enteroinvasive E coli (EIEC) NOT DETECTED NOT DETECTED Final   Cryptosporidium NOT DETECTED NOT DETECTED Final   Cyclospora cayetanensis NOT DETECTED NOT DETECTED Final   Entamoeba histolytica NOT  DETECTED NOT DETECTED Final   Giardia lamblia NOT DETECTED NOT DETECTED Final   Adenovirus F40/41 NOT DETECTED NOT DETECTED Final   Astrovirus NOT DETECTED NOT DETECTED Final   Norovirus GI/GII NOT DETECTED NOT DETECTED Final   Rotavirus A NOT DETECTED NOT DETECTED Final   Sapovirus (I, II, IV, and V) NOT DETECTED NOT DETECTED Final     Studies: No results found.  Scheduled Meds: . aspirin EC  81 mg Oral Daily  . atorvastatin  40 mg Oral q1800  . clopidogrel  75 mg Oral Daily  . insulin aspart  0-15 Units Subcutaneous TID WC  . insulin aspart  0-5 Units Subcutaneous QHS  . insulin glargine  10 Units Subcutaneous Q2200  . metroNIDAZOLE  500 mg Oral 3 times per day  . pantoprazole  40 mg Oral Daily  . sodium chloride flush  3 mL Intravenous Q12H   Continuous Infusions: .  sodium bicarbonate 150 mEq in sterile water 1000 mL infusion 150 mEq (01/02/16 1029)   Antibiotics Given (last 72 hours)    Date/Time Action Medication Dose   12/30/15 1400 Given   metroNIDAZOLE (FLAGYL) tablet 500 mg 500 mg   12/30/15 2340 Given   metroNIDAZOLE (FLAGYL) tablet 500 mg 500 mg   12/31/15 [id number] Given   metroNIDAZOLE (FLAGYL) tablet 500 mg 500 mg   12/31/15 2325 Given   metroNIDAZOLE (FLAGYL) tablet 500 mg 500 mg   01/01/16 [id number] Given   metroNIDAZOLE (FLAGYL) tablet 500 mg 500 mg   01/01/16 1417 Given   metroNIDAZOLE (FLAGYL) tablet 500 mg 500 mg   01/01/16  2226 Given   metroNIDAZOLE (FLAGYL) tablet 500 mg 500 mg   01/02/16 0600 Given   metroNIDAZOLE (FLAGYL) tablet 500 mg 500 mg      Active Problems:   CORONARY ATHEROSCLEROSIS NATIVE CORONARY ARTERY   Chronic diastolic heart failure (HCC)   Atrial fibrillation ([place])   Essential hypertension   Diabetes mellitus type 2 with complications ([place])   Anemia in CKD (chronic kidney disease)   Colitis   Acute on chronic kidney failure ([place])   Hyperkalemia   Debility   Dehydration   Prolonged QT interval    Time spent: 25 min    [facility] Pager [phone]. If 7PM-7AM, please contact night-coverage at [email or web address], password [facility] 01/02/2016, 12:08 PM  LOS: 4 days

## 2016-01-03 LAB — CBC
HCT: 26.9 % — ABNORMAL LOW (ref 36.0–46.0)
Hemoglobin: 8.3 g/dL — ABNORMAL LOW (ref 12.0–15.0)
MCH: 25.9 pg — AB (ref 26.0–34.0)
MCHC: 30.9 g/dL (ref 30.0–36.0)
MCV: 83.8 fL (ref 78.0–100.0)
PLATELETS: 391 10*3/uL (ref 150–400)
RBC: 3.21 MIL/uL — AB (ref 3.87–5.11)
RDW: 17.5 % — ABNORMAL HIGH (ref 11.5–15.5)
WBC: 10.8 10*3/uL — ABNORMAL HIGH (ref 4.0–10.5)

## 2016-01-03 LAB — BASIC METABOLIC PANEL
ANION GAP: 9 (ref 5–15)
BUN: 72 mg/dL — ABNORMAL HIGH (ref 6–20)
CALCIUM: 8.1 mg/dL — AB (ref 8.9–10.3)
CO2: 20 mmol/L — AB (ref 22–32)
CREATININE: 2.19 mg/dL — AB (ref 0.44–1.00)
Chloride: 113 mmol/L — ABNORMAL HIGH (ref 101–111)
GFR, EST AFRICAN AMERICAN: 22 mL/min — AB (ref >60)
GFR, EST NON AFRICAN AMERICAN: 19 mL/min — AB (ref >60)
Glucose, Bld: 193 mg/dL — ABNORMAL HIGH (ref 65–99)
Potassium: 4.6 mmol/L (ref 3.5–5.1)
Sodium: 142 mmol/L (ref 135–145)

## 2016-01-03 LAB — GLUCOSE, CAPILLARY
GLUCOSE-CAPILLARY: 92 mg/dL (ref 65–99)
GLUCOSE-CAPILLARY: 131 mg/dL — AB (ref 65–99)
GLUCOSE-CAPILLARY: 158 mg/dL — AB (ref 65–99)
Glucose-Capillary: 178 mg/dL — ABNORMAL HIGH (ref 65–99)

## 2016-01-03 NOTE — Progress Notes (Signed)
Patient ID: Robin [name], female   DOB: [birth year], 82 y.o.   MRN: [id number] S:feels better O:BP 124/54 mmHg  Pulse 84  Temp(Src) 98.6 F (37 C) (Oral)  Resp 18  Ht 5\' 3"  (1.6 m)  Wt 58.287 kg (128 lb 8 oz)  BMI 22.77 kg/m2  SpO2 98%  Intake/Output Summary (Last 24 hours) at 01/03/16 1150 Last data filed at 01/03/16 0924  Gross per 24 hour  Intake    643 ml  Output    200 ml  Net    443 ml   Intake/Output: I/O last 3 completed shifts: In: [id number] [P.O.:757] Out: 600 [Urine:600]  Intake/Output this shift:  Total I/O In: 243 [P.O.:240; I.V.:3] Out: -  Weight change: 0.045 kg (1.6 oz) [id number] elderly WF in NAD CVS:no rub Resp:cta [id number] Ext:1+ edema   Recent Labs Lab 12/28/15 0850 12/29/15 1730 12/30/15 0606 12/31/15 0451 01/01/16 0441 01/02/16 0339 01/03/16 0920  NA 135 133* 132* 134* 135 142 142  K 4.4 5.5* 5.0 4.9 5.0 4.5 4.6  CL 109 106 107 109 113* 118* 113*  CO2 18* 16* 16* 16* 14* 16* 20*  GLUCOSE 173* 186* 191* 183* 137* 59* 193*  BUN 44* 52* 58* 67* 89* 90* 72*  CREATININE 2.65* 2.90* 2.82* 2.99* 3.12* 2.76* 2.19*  ALBUMIN  --   --  1.6*  --   --  1.6*  --   CALCIUM 7.9* 8.8* 8.2* 8.2* 7.8* 8.1* 8.1*  PHOS  --   --  5.5*  --   --  3.8  --   AST  --   --  36  --   --   --   --   ALT  --   --  16  --   --   --   --    Liver Function Tests:  Recent Labs Lab 12/30/15 0606 01/02/16 0339  AST 36  --   ALT 16  --   ALKPHOS 68  --   BILITOT 0.6  --   PROT 5.0*  --   ALBUMIN 1.6* 1.6*   No results for input(s): LIPASE, AMYLASE in the last 168 hours. No results for input(s): AMMONIA in the last 168 hours. CBC:  Recent Labs Lab 12/28/15 0850  12/30/15 0606 12/31/15 0451 01/01/16 0441 01/02/16 0339 01/03/16 0920  WBC 14.9*  < > 10.4 13.5* 15.8* 13.1* 10.8*  NEUTROABS 11.1*  --   --   --   --   --   --   HGB 7.7*  < > 7.6* 8.2* 7.8* 7.8* 8.3*  HCT 24.6*  < > 24.7* 26.9* 25.3* 25.0* 26.9*  MCV 86.3  < > 84.9 84.1 83.2 83.1 83.8  PLT 327   < > 411* 511* 518* 446* 391  < > = values in this interval not displayed. Cardiac Enzymes: No results for input(s): CKTOTAL, CKMB, CKMBINDEX, TROPONINI in the last 168 hours. CBG:  Recent Labs Lab 01/02/16 0624 01/02/16 1112 01/02/16 1643 01/02/16 2122 01/03/16 0608  GLUCAP 84 71 111* 103* 92    Iron Studies: No results for input(s): IRON, TIBC, TRANSFERRIN, FERRITIN in the last 72 hours. Studies/Results: No results found. [place] Kitchen aspirin EC  81 mg Oral Daily  . atorvastatin  40 mg Oral q1800  . clopidogrel  75 mg Oral Daily  . insulin aspart  0-15 Units Subcutaneous TID WC  . insulin aspart  0-5 Units Subcutaneous QHS  . metroNIDAZOLE  500 mg Oral 3 times per day  .  pantoprazole  40 mg Oral Daily  . sodium chloride flush  3 mL Intravenous Q12H    BMET    Component Value Date/Time   NA 142 01/03/2016 0920   NA 145* 12/04/2015 0918   K 4.6 01/03/2016 0920   CL 113* 01/03/2016 0920   CO2 20* 01/03/2016 0920   GLUCOSE 193* 01/03/2016 0920   GLUCOSE 154* 12/04/2015 0918   BUN 72* 01/03/2016 0920   BUN 66* 12/04/2015 0918   CREATININE 2.19* 01/03/2016 0920   CREATININE 1.94* 05/01/2013 1042   CALCIUM 8.1* 01/03/2016 0920   GFRNONAA 19* 01/03/2016 0920   GFRNONAA 23* 05/01/2013 1042   GFRAA 22* 01/03/2016 0920   GFRAA 27* 05/01/2013 1042   CBC    Component Value Date/Time   WBC 10.8* 01/03/2016 0920   WBC 5.4 05/18/2015 1513   RBC 3.21* 01/03/2016 0920   RBC 2.91* 12/30/2015 0606   RBC 4.11 05/18/2015 1513   HGB 8.3* 01/03/2016 0920   HGB 11.6* 05/18/2015 1513   HCT 26.9* 01/03/2016 0920   HCT 37.2* 05/18/2015 1513   PLT 391 01/03/2016 0920   MCV 83.8 01/03/2016 0920   MCV 90.4 05/18/2015 1513   MCH 25.9* 01/03/2016 0920   MCH 28.1 05/18/2015 1513   MCHC 30.9 01/03/2016 0920   MCHC 31.1* 05/18/2015 1513   RDW 17.5* 01/03/2016 0920   LYMPHSABS 1.2 12/28/2015 0850   MONOABS 2.3* 12/28/2015 0850   EOSABS 0.3 12/28/2015 0850   BASOSABS 0.1 12/28/2015 0850       Assessment/Plan: 1. AKI/CKD stage 3-4- in setting of C. Diff colitis and low FeNa. Underlying CKD likely related to DM and HTN. Has been chronic and progressive but has not seen a Nephrologist locally. She is not a suitable candidate for HD due to her advanced age, debilitated state, and multiple, irreversible co-morbidities. I discussed this with the patient and her daughter and both are in agreement that she would no benefit from renal replacement therapy if needed and would get hospice involved at that time.  1. Improving with IVF's 2. Continue to follow UOP and daily Scr. 3. Scr is back to baseline of 1.8-2.1.  Will sign off for now and would recommend patient to f/u with local Nephrologist in [place] 2. Hyperkalemia- improved after IVF's 3. Metabolic acidosis- due to #1. improved after changing IVF's to isotonic bicarb.  Now off and follow 4. C. Diff Colitis- on empiric flagyl, + C diff Ag on 12/29/15.  5. HTN- stable 6. DM - stable per primary svc 7. CAD- stable 8. P Afib- stable 9. Severe pulmonary HTN- likely contributing to her edema. Continue to follow 10. Foot pain- improving. Check uric acid level and xrays per primary 11. Anemia of chronic disease- will dose with IV Iron and would likely benefit from ESA therapy from a local nephrologist or oncologist 12. ASCVD- stable 13. Severe protein malnutrition- per primary svc 14. Disposition- wants to go to rehab facility closer to home.  Awaiting SNF placement.  [facility]

## 2016-01-03 NOTE — Progress Notes (Signed)
PROGRESS NOTE  Robin [name] [id number] DOB: 09-Aug-1928 DOA: 12/29/2015 PCP: Chevis Pretty, FNP  82 year old female with history of chronic kidney disease, diabetes, coronary artery disease is just recently admitted for colitis was treated with Flagyl and Cipro while hospitalized develop was thought to be gout and was treated with colchicine discharged to home unable to tolerate weightbearing secondary to pain and was brought back was found to have worsening renal function and hyperkalemia  Assessment/Plan: Acute on chronic kidney failure ([place]) -  -possibly secondary to decreased by mouth intake.  -hold torsemide  -gentle fluids, stop lisinopril  -stop gout meds  Anemia in CKD (chronic kidney disease)  -transfuse for < 7   Atrial fibrillation (HCC) - patient on Plavix not on anticoagulation as has refused per previous notes by cardiology CHA2D Vasc score 7  Chronic diastolic heart failure (HCC) -  - hold torsemide for now will need to restart when able  Colitis/diarrhea -  -will treat for presumed c diff -stools per patient are becoming more firm  Diabetes mellitus type 2 with complications ([place]) -SSI -lantus given  Essential hypertension continue home medications  Hyperkalemia: -stop lisinopril and Potassium administer gentle fluids  Debility -SNF  Gout-  -stop steroids as patient was becoming confused -stop colchine -uric acid elevated-- ? Due to CKD -? Arthritis-- will give ultram as well  Dehydration hold torsemide give gentle fluids   H/o pituitary apoplexy   Code Status: full Family Communication: patient/daughter Disposition Plan: SNF (suspect Monday/Tuesday)   Consultants:  renal  Procedures:     HPI/Subjective: Continues to improve with eating and mental status  Objective: Filed Vitals:   01/02/16 2258 01/03/16 0438  BP: 148/53 124/54  Pulse: 78 84  Temp: 98.7 F (37.1 C) 98.6 F (37 C)  Resp: 18 18    Intake/Output  Summary (Last 24 hours) at 01/03/16 1140 Last data filed at 01/03/16 0924  Gross per 24 hour  Intake    643 ml  Output    200 ml  Net    443 ml   Filed Weights   01/01/16 0503 01/02/16 0347 01/03/16 0438  Weight: 58.196 kg (128 lb 4.8 oz) 58.242 kg (128 lb 6.4 oz) 58.287 kg (128 lb 8 oz)    Exam:   General:  Awake, NAD  Abdomen: +BS, soft  Musculoskeletal: min edema left ankle- no longer tender, left arm swollen  Data Reviewed: Basic Metabolic Panel:  Recent Labs Lab 12/30/15 0606 12/31/15 0451 01/01/16 0441 01/02/16 0339 01/03/16 0920  NA 132* 134* 135 142 142  K 5.0 4.9 5.0 4.5 4.6  CL 107 109 113* 118* 113*  CO2 16* 16* 14* 16* 20*  GLUCOSE 191* 183* 137* 59* 193*  BUN 58* 67* 89* 90* 72*  CREATININE 2.82* 2.99* 3.12* 2.76* 2.19*  CALCIUM 8.2* 8.2* 7.8* 8.1* 8.1*  MG 1.8  --   --   --   --   PHOS 5.5*  --   --  3.8  --    Liver Function Tests:  Recent Labs Lab 12/30/15 0606 01/02/16 0339  AST 36  --   ALT 16  --   ALKPHOS 68  --   BILITOT 0.6  --   PROT 5.0*  --   ALBUMIN 1.6* 1.6*   No results for input(s): LIPASE, AMYLASE in the last 168 hours. No results for input(s): AMMONIA in the last 168 hours. CBC:  Recent Labs Lab 12/28/15 0850  12/30/15 0606 12/31/15 0451 01/01/16 0441 01/02/16  [id number] 01/03/16 0920  WBC 14.9*  < > 10.4 13.5* 15.8* 13.1* 10.8*  NEUTROABS 11.1*  --   --   --   --   --   --   HGB 7.7*  < > 7.6* 8.2* 7.8* 7.8* 8.3*  HCT 24.6*  < > 24.7* 26.9* 25.3* 25.0* 26.9*  MCV 86.3  < > 84.9 84.1 83.2 83.1 83.8  PLT 327  < > 411* 511* 518* 446* 391  < > = values in this interval not displayed. Cardiac Enzymes: No results for input(s): CKTOTAL, CKMB, CKMBINDEX, TROPONINI in the last 168 hours. BNP (last 3 results) No results for input(s): BNP in the last 8760 hours.  ProBNP (last 3 results) No results for input(s): PROBNP in the last 8760 hours.  CBG:  Recent Labs Lab 01/02/16 0624 01/02/16 1112 01/02/16 1643  01/02/16 2122 01/03/16 0608  GLUCAP 84 71 111* 103* 92    Recent Results (from the past 240 hour(s))  Blood culture (routine x 2)     Status: None   Collection Time: 12/24/15  8:43 PM  Result Value Ref Range Status   Specimen Description BLOOD RIGHT ANTECUBITAL  Final   Special Requests BOTTLES DRAWN AEROBIC AND ANAEROBIC 6CC  Final   Culture NO GROWTH 5 DAYS  Final   Report Status 12/29/2015 FINAL  Final  Blood culture (routine x 2)     Status: None   Collection Time: 12/24/15  9:05 PM  Result Value Ref Range Status   Specimen Description BLOOD RIGHT ANTECUBITAL  Final   Special Requests BOTTLES DRAWN AEROBIC AND ANAEROBIC 6CC  Final   Culture NO GROWTH 5 DAYS  Final   Report Status 12/29/2015 FINAL  Final  Urine culture     Status: None   Collection Time: 12/29/15  8:41 PM  Result Value Ref Range Status   Specimen Description URINE, CLEAN CATCH  Final   Special Requests NONE  Final   Culture MULTIPLE SPECIES PRESENT, SUGGEST RECOLLECTION  Final   Report Status 12/31/2015 FINAL  Final  C difficile quick scan w PCR reflex     Status: Abnormal   Collection Time: 12/30/15  3:06 PM  Result Value Ref Range Status   C Diff antigen POSITIVE (A) NEGATIVE Final   C Diff toxin NEGATIVE NEGATIVE Final   C Diff interpretation   Final    C. difficile present, but toxin not detected. This indicates colonization. In most cases, this does not require treatment. If patient has signs and symptoms consistent with colitis, consider treatment. Requires ENTERIC precautions.  Gastrointestinal Panel by PCR , Stool     Status: None   Collection Time: 01/01/16 11:52 AM  Result Value Ref Range Status   Campylobacter species NOT DETECTED NOT DETECTED Final   Plesimonas shigelloides NOT DETECTED NOT DETECTED Final   Salmonella species NOT DETECTED NOT DETECTED Final   Yersinia enterocolitica NOT DETECTED NOT DETECTED Final   Vibrio species NOT DETECTED NOT DETECTED Final   Vibrio cholerae NOT DETECTED  NOT DETECTED Final   Enteroaggregative E coli (EAEC) NOT DETECTED NOT DETECTED Final   Enteropathogenic E coli (EPEC) NOT DETECTED NOT DETECTED Final   Enterotoxigenic E coli (ETEC) NOT DETECTED NOT DETECTED Final   Shiga like toxin producing E coli (STEC) NOT DETECTED NOT DETECTED Final   E. coli O157 NOT DETECTED NOT DETECTED Final   Shigella/Enteroinvasive E coli (EIEC) NOT DETECTED NOT DETECTED Final   Cryptosporidium NOT DETECTED NOT DETECTED Final   Cyclospora  cayetanensis NOT DETECTED NOT DETECTED Final   Entamoeba histolytica NOT DETECTED NOT DETECTED Final   Giardia lamblia NOT DETECTED NOT DETECTED Final   Adenovirus F40/41 NOT DETECTED NOT DETECTED Final   Astrovirus NOT DETECTED NOT DETECTED Final   Norovirus GI/GII NOT DETECTED NOT DETECTED Final   Rotavirus A NOT DETECTED NOT DETECTED Final   Sapovirus (I, II, IV, and V) NOT DETECTED NOT DETECTED Final     Studies: No results found.  Scheduled Meds: . aspirin EC  81 mg Oral Daily  . atorvastatin  40 mg Oral q1800  . clopidogrel  75 mg Oral Daily  . insulin aspart  0-15 Units Subcutaneous TID WC  . insulin aspart  0-5 Units Subcutaneous QHS  . metroNIDAZOLE  500 mg Oral 3 times per day  . pantoprazole  40 mg Oral Daily  . sodium chloride flush  3 mL Intravenous Q12H   Continuous Infusions:   Antibiotics Given (last 72 hours)    Date/Time Action Medication Dose   12/31/15 2325 Given   metroNIDAZOLE (FLAGYL) tablet 500 mg 500 mg   01/01/16 [id number] Given   metroNIDAZOLE (FLAGYL) tablet 500 mg 500 mg   01/01/16 1417 Given   metroNIDAZOLE (FLAGYL) tablet 500 mg 500 mg   01/01/16 2226 Given   metroNIDAZOLE (FLAGYL) tablet 500 mg 500 mg   01/02/16 0600 Given   metroNIDAZOLE (FLAGYL) tablet 500 mg 500 mg   01/02/16 1455 Given   metroNIDAZOLE (FLAGYL) tablet 500 mg 500 mg   01/02/16 2150 Given   metroNIDAZOLE (FLAGYL) tablet 500 mg 500 mg   01/03/16 [id number] Given   metroNIDAZOLE (FLAGYL) tablet 500 mg 500 mg       Active Problems:   CORONARY ATHEROSCLEROSIS NATIVE CORONARY ARTERY   Chronic diastolic heart failure (HCC)   Atrial fibrillation ([place])   Essential hypertension   Diabetes mellitus type 2 with complications ([place])   Anemia in CKD (chronic kidney disease)   Colitis   Acute on chronic kidney failure ([place])   Hyperkalemia   Debility   Dehydration   Prolonged QT interval    Time spent: 25 min    [facility] Pager [phone]. If 7PM-7AM, please contact night-coverage at [email or web address], password [facility] 01/03/2016, 11:40 AM  LOS: 5 days

## 2016-01-04 DIAGNOSIS — K529 Noninfective gastroenteritis and colitis, unspecified: Secondary | ICD-10-CM

## 2016-01-04 LAB — GLUCOSE, CAPILLARY
Glucose-Capillary: 125 mg/dL — ABNORMAL HIGH (ref 65–99)
Glucose-Capillary: 160 mg/dL — ABNORMAL HIGH (ref 65–99)

## 2016-01-04 MED ORDER — INSULIN ASPART 100 UNIT/ML ~~LOC~~ SOLN: 0.0000 [IU] | Freq: Every day | SUBCUTANEOUS | Status: AC

## 2016-01-04 MED ORDER — ACETAMINOPHEN 325 MG PO TABS: 650.0000 mg | ORAL_TABLET | Freq: Four times a day (QID) | ORAL | Status: AC | PRN

## 2016-01-04 MED ORDER — INSULIN ASPART 100 UNIT/ML ~~LOC~~ SOLN: 0.0000 [IU] | Freq: Three times a day (TID) | SUBCUTANEOUS | Status: AC

## 2016-01-04 MED ORDER — COLCRYS 0.6 MG PO TABS: 0.3000 mg | ORAL_TABLET | ORAL | Status: AC

## 2016-01-04 MED ORDER — METRONIDAZOLE 500 MG PO TABS: 500.0000 mg | ORAL_TABLET | Freq: Three times a day (TID) | ORAL | Status: AC

## 2016-01-04 MED ORDER — OXYMETAZOLINE HCL 0.05 % NA SOLN: 2.0000 | Freq: Two times a day (BID) | NASAL | Status: AC | PRN

## 2016-01-04 MED ORDER — TRAMADOL HCL 50 MG PO TABS: 50.0000 mg | ORAL_TABLET | Freq: Two times a day (BID) | ORAL | Status: AC | PRN

## 2016-01-04 NOTE — Discharge Summary (Signed)
Physician Discharge Summary  Robin [name] [id number] DOB: February 26, 1928 DOA: 12/29/2015  PCP: Chevis Pretty, FNP  Admit date: 12/29/2015 Discharge date: 01/04/2016  Time spent: 35 minutes  Recommendations for Outpatient Follow-up:  1. Follow up with nephrology in [place] 2. BMP 2 week 3. If once flagyl course is complete and diarrhea not resolved, will need to see GI 4. If Cr remains stable- consider restarting torsemide 5. Flagyl through 2/6 6. Monitor blood sugars   Discharge Diagnoses:  Active Problems:   CORONARY ATHEROSCLEROSIS NATIVE CORONARY ARTERY   Chronic diastolic heart failure (HCC)   Atrial fibrillation (HCC)   Essential hypertension   Diabetes mellitus type 2 with complications (HCC)   Anemia in CKD (chronic kidney disease)   Colitis   Acute on chronic kidney failure (HCC)   Hyperkalemia   Debility   Dehydration   Prolonged QT interval   Discharge Condition: improved  Diet recommendation: carb mod  Filed Weights   01/02/16 0347 01/03/16 0438 01/04/16 0457  Weight: 58.242 kg (128 lb 6.4 oz) 58.287 kg (128 lb 8 oz) 56.019 kg (123 lb 8 oz)    History of present illness:  Robin [name] is a 82 y.o. female   has a past medical history of Chronic renal insufficiency; Hypertension; Hypokalemia; DM (diabetes mellitus) ([place]); Myocardial infarction ([place]); Shortness of breath; Cancer ([place]); Chronic diastolic heart failure ([place]); Valvular heart disease; Pulmonary hypertension ([place]); CAD (coronary artery disease); Carotid artery disease ([place]); HLD (hyperlipidemia); Atrial fibrillation ([place]); and Peripheral edema.   Presented with difficulty with ambulation Patient was recently admitted to [facility] from 19-24 of January for colitis she was admitted with profuse diarrhea fevers and abdominal pain. She was treated with IV Flagyl and Cipro for to have infectious colitis although at [place]. DIFFICILE COLITIS WAS SUSPECTED unfortunately sample did not make it  to the lab. She improved on antibiotics and was discharged today. During her stay she developed gout of the left ankle her colchicine was restarted and she improved very soon thereafter. Per notes patient was anxious to be discharged home and was discharged this morning with her family. She had PT evaluation at the time of discharge and recommended no follow-up. Mobile to home patient stated she could not walk due to bilateral lower extremity pain. Family reports she still having diarrhea. Had a Bm on arrival to the house and was too weak for family to get her in the house and to have her be cleaned up. She has abdominal distention and nausea with vomiting.  She is Anemic denies nay blood in stool no melena.  Called 911 and was taken to [facility] ER.  In ER: Was noted to have elevated potassium up to 5.5 and creatinine of 2.9 from baseline of 2.4 white blood cell count has been trending down from 19 2 days ago to 16.6.  Significant past history including diabetes mellitus type 2 for which she is controlled with Lantus, history of atrial fibrillation not on anticoagulation She also has history of diastolic heart failure but that has been well controlled she has prescription for Zaroxolyn but does not need to use it  Hospitalist was called for admission for hyperkalemia and debility due to bilateral lower extremity pain  Hospital Course:  Acute on chronic kidney failure ([place]) -  -possibly secondary to decreased by mouth intake.  -hold torsemide  -gentle fluids, stop lisinopril   Anemia in CKD (chronic kidney disease)  -transfuse for < 7  -given IV fe  Atrial fibrillation ([place]) - patient on Plavix not on anticoagulation as has refused per previous notes by cardiology CHA2D Vasc score 7  Chronic diastolic heart failure (HCC) -  - hold torsemide for now will need to restart when able  Colitis/diarrhea -  -will treat for presumed c diff -stools per patient are becoming more  firm  Diabetes mellitus type 2 with complications ([place]) -SSI -monitor blood sugars  Essential hypertension continue home medications  Hyperkalemia: -stop lisinopril and Potassium administer gentle fluids  Debility -SNF  Gout- not typical presentation -stop steroids as patient was becoming confused -uric acid elevated-- ? Due to CKD -? Arthritis-- will give ultram as well  Dehydration hold torsemide give gentle fluids   H/o pituitary apoplexy   Procedures:    Consultations:  renal  Discharge Exam: Filed Vitals:   01/03/16 1946 01/04/16 0457  BP: 128/46 129/62  Pulse: 79 80  Temp: 97.6 F (36.4 C) 98.4 F (36.9 C)  Resp: 17 19    General: awake, feeling much better, stools decreasing and becoming more formed   Discharge Instructions   Discharge Instructions    AMB Referral to [facility] Management    Complete by:  As directed   Reason for consult:  Re-admission - post hospital follow up  Diagnoses of:  Heart Failure  Expected date of contact:  1-3 days (reserved for hospital discharges)  Please assign to community nurse for transition of care calls and assess for home visits for chronic disease education and monitoring.  Please assess for any resource needs. Questions please call:  Natividad Brood, RN BSN [facility] Liaison  [phone] business mobile phone Toll free office [phone]          Current Discharge Medication List    CONTINUE these medications which have NOT CHANGED   Details  aspirin EC 81 MG tablet Take 1 tablet (81 mg total) by mouth daily.    atorvastatin (LIPITOR) 40 MG tablet TAKE ONE (1) TABLET EACH DAY Qty: 30 tablet, Refills: 2   Associated Diagnoses: Hyperlipemia    cholecalciferol (VITAMIN D) 1000 UNITS tablet Take 1,000 Units by mouth daily.    ciprofloxacin (CIPRO) 500 MG tablet Take 1 tablet (500 mg total) by mouth 2 (two) times daily. Qty: 10 tablet, Refills: 0    clopidogrel (PLAVIX) 75 MG  tablet TAKE ONE (1) TABLET EACH DAY Qty: 30 tablet, Refills: 5   Associated Diagnoses: Paroxysmal atrial fibrillation (HCC)    COLCRYS 0.6 MG tablet Take 0.5 tablets (0.3 mg total) by mouth daily. Qty: 30 tablet, Refills: 2    feeding supplement, GLUCERNA SHAKE, (GLUCERNA SHAKE) LIQD Take 237 mLs by mouth 2 (two) times daily between meals. Qty: 30 Can, Refills: 0    fenofibrate micronized (LOFIBRA) 134 MG capsule Take 1 capsule (134 mg total) by mouth daily before breakfast. Qty: 30 capsule, Refills: 5   Associated Diagnoses: Hyperlipemia    glimepiride (AMARYL) 4 MG tablet Take 0.5 tablets (2 mg total) by mouth daily. Qty: 30 tablet, Refills: 5   Associated Diagnoses: Type 2 diabetes mellitus with complication, without long-term current use of insulin (HCC)    hydrALAZINE (APRESOLINE) 25 MG tablet Take 1 tablet (25 mg total) by mouth every 8 (eight) hours. Qty: 90 tablet, Refills: 1    LANTUS SOLOSTAR 100 UNIT/ML Solostar Pen Inject 10 Units into the skin daily at 10 pm. Qty: 15 mL, Refills: 5   Associated Diagnoses: Type 2 diabetes mellitus with complication, without long-term current  use of insulin (HCC)    lisinopril (PRINIVIL,ZESTRIL) 20 MG tablet Take 20 mg by mouth 2 (two) times daily.    metoCLOPramide (REGLAN) 10 MG tablet Take 5 mg by mouth daily as needed for nausea or vomiting. Reported on 12/04/2015    metroNIDAZOLE (FLAGYL) 500 MG tablet Take 1 tablet (500 mg total) by mouth every 8 (eight) hours. Qty: 15 tablet, Refills: 0    nitroGLYCERIN (NITROSTAT) 0.4 MG SL tablet Place 1 tablet (0.4 mg total) under the tongue every 5 (five) minutes x 3 doses as needed. Qty: 25 tablet, Refills: 3    omeprazole (PRILOSEC) 40 MG capsule TAKE ONE (1) CAPSULE EACH DAY Qty: 30 capsule, Refills: 2    potassium chloride (K-DUR) 10 MEQ tablet Take 1 tablet (10 mEq total) by mouth 2 (two) times daily. Qty: 30 tablet, Refills: 5   Associated Diagnoses: Hypokalemia    torsemide  (DEMADEX) 20 MG tablet Take 1 tablet (20 mg total) by mouth 2 (two) times daily as needed. Swelling; Some days, daughter gives her 2 tablets every other day Qty: 30 tablet, Refills: 5   Associated Diagnoses: Peripheral edema    TRADJENTA 5 MG TABS tablet TAKE ONE (1) TABLET EACH DAY Qty: 30 tablet, Refills: 5    UNIFINE PENTIPS 32G X 4 MM MISC EVERY DAY Qty: 100 each, Refills: 2       No Known Allergies Follow-up Information    Follow up with Chevis Pretty, FNP. Go on 01/07/2016.   Specialty:  Family Medicine   Why:  @11 :Max Sane information:   [place] [id number] [phone]       Follow up with [facility].   Why:  They will do your home health care at your home   Contact information:   [address] [place] [address] [phone]        The results of significant diagnostics from this hospitalization (including imaging, microbiology, ancillary and laboratory) are listed below for reference.    Significant Diagnostic Studies: Ct Abdomen Pelvis Wo Contrast  12/24/2015  CLINICAL DATA:  82 year old female with abdominal pain, diarrhea and fever for 3 days. Patient with chronic renal failure. EXAM: CT ABDOMEN AND PELVIS WITHOUT CONTRAST TECHNIQUE: Multidetector CT imaging of the abdomen and pelvis was performed following the standard protocol without IV contrast. COMPARISON:  02/01/2010 and 10/25/2010 ultrasound. FINDINGS: Please note that parenchymal abnormalities may be missed without intravenous contrast. Lower chest: A trace right pleural effusion is noted. Cardiomegaly noted. Hepatobiliary: A 1.1 x 1.4 cm fat containing lesion within the right liver is unchanged and compatible with a benign fatty lesion. The patient is status post cholecystectomy. There is no evidence of intrahepatic biliary dilatation. Pancreas: Unremarkable Spleen: Unremarkable Adrenals/Urinary Tract: Mild bilateral renal cortical atrophy noted.  A probable cysts within the left lower pole identified. The adrenal glands and bladder are unremarkable. Stomach/Bowel: There is circumferential wall thickening of the descending colon compatible with colitis. There is equivocal wall thickening of portions of the transverse colon. There is no evidence of bowel obstruction, pneumatosis, pneumoperitoneum or focal collection/abscess. Vascular/Lymphatic: Heavy atherosclerotic calcifications of the aorta, mesenteric arteries and origins are the renal arteries noted. There is no evidence of aortic aneurysm. No enlarged lymph nodes are identified. Reproductive: Patient is status post hysterectomy. There is no evidence of adnexal mass. No free fluid identified. Other: No acute or suspicious abnormality. Musculoskeletal: No acute or suspicious abnormalities are identified. Moderate degenerative changes of the  lumbar spine noted. IMPRESSION: Colitis involving the descending colon with equivocal involvement of the transverse colon. Differential includes infectious, inflammatory or ischemic colitis given heavy atherosclerotic calcification of the mesenteric arteries. No evidence of bowel obstruction, pneumoperitoneum, pneumatosis or abscess. Trace right pleural effusion. Cardiomegaly. Electronically Signed   By: Margarette Canada M.D.   On: 12/24/2015 20:29   Dg Ankle 2 Views Left  12/29/2015  CLINICAL DATA:  Both ankles and feet are painful. No trauma. History of gout. Soft tissue swelling over the left lateral malleolus. EXAM: LEFT ANKLE - 2 VIEW COMPARISON:  None. FINDINGS: Soft tissue swelling over the left ankle. No evidence of acute fracture or dislocation. Bone cortex appears intact. No evidence of significant bone erosion. Vascular calcifications in the soft tissues. No radiopaque soft tissue foreign bodies or gas collections. IMPRESSION: Soft tissue swelling.  No acute bony abnormalities. Electronically Signed   By: Lucienne Capers M.D.   On: 12/29/2015 22:53   Dg  Ankle 2 Views Right  12/29/2015  CLINICAL DATA:  Acute onset of right ankle pain.  Initial encounter. EXAM: RIGHT ANKLE - 2 VIEW COMPARISON:  None. FINDINGS: There is no evidence of fracture or dislocation. The ankle mortise is intact; the interosseous space is within normal limits. No talar tilt or subluxation is seen. The joint spaces are preserved. Mild medial soft tissue swelling is noted. Diffuse vascular calcifications are seen. IMPRESSION: 1. No evidence of fracture or dislocation. 2. Diffuse vascular calcifications seen. Electronically Signed   By: Garald Balding M.D.   On: 12/29/2015 22:54   Dg Abd 1 View  12/29/2015  CLINICAL DATA:  Lower abdominal pain with distention and diarrhea for 5 days. EXAM: ABDOMEN - 1 VIEW COMPARISON:  CT abdomen and pelvis 12/24/2015.  Abdomen 08/14/2015. FINDINGS: Scattered gas and stool in the colon. No small or large bowel distention. Mild thumbprinting in the descending colon corresponding to wall thickening from colitis seen on previous CT scan. No radiopaque stones. Calcified phleboliths in the pelvis. Vascular calcifications. Degenerative changes and scoliosis of the lumbar spine with convexity towards the left. IMPRESSION: Nonobstructive bowel gas pattern. Thumb printing demonstrated over the descending colon corresponding to changes of colitis seen on prior CT scan. Electronically Signed   By: Lucienne Capers M.D.   On: 12/29/2015 20:55    Microbiology: Recent Results (from the past 240 hour(s))  Urine culture     Status: None   Collection Time: 12/29/15  8:41 PM  Result Value Ref Range Status   Specimen Description URINE, CLEAN CATCH  Final   Special Requests NONE  Final   Culture MULTIPLE SPECIES PRESENT, SUGGEST RECOLLECTION  Final   Report Status 12/31/2015 FINAL  Final  C difficile quick scan w PCR reflex     Status: Abnormal   Collection Time: 12/30/15  3:06 PM  Result Value Ref Range Status   C Diff antigen POSITIVE (A) NEGATIVE Final   C  Diff toxin NEGATIVE NEGATIVE Final   C Diff interpretation   Final    C. difficile present, but toxin not detected. This indicates colonization. In most cases, this does not require treatment. If patient has signs and symptoms consistent with colitis, consider treatment. Requires ENTERIC precautions.  Gastrointestinal Panel by PCR , Stool     Status: None   Collection Time: 01/01/16 11:52 AM  Result Value Ref Range Status   Campylobacter species NOT DETECTED NOT DETECTED Final   Plesimonas shigelloides NOT DETECTED NOT DETECTED Final   Salmonella species NOT  DETECTED NOT DETECTED Final   Yersinia enterocolitica NOT DETECTED NOT DETECTED Final   Vibrio species NOT DETECTED NOT DETECTED Final   Vibrio cholerae NOT DETECTED NOT DETECTED Final   Enteroaggregative E coli (EAEC) NOT DETECTED NOT DETECTED Final   Enteropathogenic E coli (EPEC) NOT DETECTED NOT DETECTED Final   Enterotoxigenic E coli (ETEC) NOT DETECTED NOT DETECTED Final   Shiga like toxin producing E coli (STEC) NOT DETECTED NOT DETECTED Final   E. coli O157 NOT DETECTED NOT DETECTED Final   Shigella/Enteroinvasive E coli (EIEC) NOT DETECTED NOT DETECTED Final   Cryptosporidium NOT DETECTED NOT DETECTED Final   Cyclospora cayetanensis NOT DETECTED NOT DETECTED Final   Entamoeba histolytica NOT DETECTED NOT DETECTED Final   Giardia lamblia NOT DETECTED NOT DETECTED Final   Adenovirus F40/41 NOT DETECTED NOT DETECTED Final   Astrovirus NOT DETECTED NOT DETECTED Final   Norovirus GI/GII NOT DETECTED NOT DETECTED Final   Rotavirus A NOT DETECTED NOT DETECTED Final   Sapovirus (I, II, IV, and V) NOT DETECTED NOT DETECTED Final     Labs: Basic Metabolic Panel:  Recent Labs Lab 12/30/15 0606 12/31/15 0451 01/01/16 0441 01/02/16 0339 01/03/16 0920  NA 132* 134* 135 142 142  K 5.0 4.9 5.0 4.5 4.6  CL 107 109 113* 118* 113*  CO2 16* 16* 14* 16* 20*  GLUCOSE 191* 183* 137* 59* 193*  BUN 58* 67* 89* 90* 72*  CREATININE  2.82* 2.99* 3.12* 2.76* 2.19*  CALCIUM 8.2* 8.2* 7.8* 8.1* 8.1*  MG 1.8  --   --   --   --   PHOS 5.5*  --   --  3.8  --    Liver Function Tests:  Recent Labs Lab 12/30/15 0606 01/02/16 0339  AST 36  --   ALT 16  --   ALKPHOS 68  --   BILITOT 0.6  --   PROT 5.0*  --   ALBUMIN 1.6* 1.6*   No results for input(s): LIPASE, AMYLASE in the last 168 hours. No results for input(s): AMMONIA in the last 168 hours. CBC:  Recent Labs Lab 12/30/15 0606 12/31/15 0451 01/01/16 0441 01/02/16 0339 01/03/16 0920  WBC 10.4 13.5* 15.8* 13.1* 10.8*  HGB 7.6* 8.2* 7.8* 7.8* 8.3*  HCT 24.7* 26.9* 25.3* 25.0* 26.9*  MCV 84.9 84.1 83.2 83.1 83.8  PLT 411* 511* 518* 446* 391   Cardiac Enzymes: No results for input(s): CKTOTAL, CKMB, CKMBINDEX, TROPONINI in the last 168 hours. BNP: BNP (last 3 results) No results for input(s): BNP in the last 8760 hours.  ProBNP (last 3 results) No results for input(s): PROBNP in the last 8760 hours.  CBG:  Recent Labs Lab 01/03/16 0608 01/03/16 1236 01/03/16 1703 01/03/16 2143 01/04/16 0621  GLUCAP 92 131* 158* 178* 125*       Signed:  Mennie Spiller U Kanav Kazmierczak DO.  [facility] 01/04/2016, 9:44 AM

## 2016-01-04 NOTE — Progress Notes (Signed)
Report called to Nevin Bloodgood, LPN at [facility]. Daughter received discharge instructions from MD with verbal understanding. Patient in route via EMS with daughter following behind.

## 2016-01-04 NOTE — Progress Notes (Signed)
   01/04/16 1100  Clinical Encounter Type  Visited With Patient;Family;Patient and family together  Visit Type Other (Comment) (Advanced Directive)  Referral From Patient;Nurse  Spiritual Encounters  Spiritual Needs Other (Comment) (complete Advanced Directive)  [place] arranged for notary and witnesses to complete Advanced Directive.  [place] observed competence of pt in order to complete. Gwynn Burly 11:20 AM

## 2016-01-05 ENCOUNTER — Other Ambulatory Visit: Payer: Self-pay | Admitting: *Deleted

## 2016-01-05 LAB — PROTEIN ELECTROPHORESIS, SERUM
A/G RATIO SPE: 0.7 (ref 0.7–1.7)
ALBUMIN ELP: 1.8 g/dL — AB (ref 2.9–4.4)
ALPHA-1-GLOBULIN: 0.3 g/dL (ref 0.0–0.4)
Alpha-2-Globulin: 0.8 g/dL (ref 0.4–1.0)
Beta Globulin: 0.6 g/dL — ABNORMAL LOW (ref 0.7–1.3)
GLOBULIN, TOTAL: 2.6 g/dL (ref 2.2–3.9)
Gamma Globulin: 0.9 g/dL (ref 0.4–1.8)
M-Spike, %: 0.2 g/dL — ABNORMAL HIGH
TOTAL PROTEIN ELP: 4.4 g/dL — AB (ref 6.0–8.5)

## 2016-01-05 NOTE — Patient Outreach (Signed)
01/05/16- Patient discharged from hospital on 01/04/16, telephone call for transition of care week 1, spoke with daughter Resa Miner who reports pt went to short term rehab at [facility]. Telephone call to [facility] to collaborate regarding plan of care, left voicemail for Lilian Kapur.- case manager requesting return phone call.  Jacqlyn Larsen [facility], [place] Coordinator [phone]

## 2016-01-07 ENCOUNTER — Ambulatory Visit: Payer: Medicare Other | Admitting: Nurse Practitioner

## 2016-01-15 NOTE — Clinical Social Work Placement (Signed)
   CLINICAL SOCIAL WORK PLACEMENT  NOTE  Date: 01/04/2016 Patient Details  Name: Robin [name] MRN: [id number] Date of Birth: 1928/09/03  Clinical Social Work is seeking post-discharge placement for this patient at the [place] level of care (*CSW will initial, date and re-position this form in  chart as items are completed):  Yes   Patient/family provided with [place] Work Department's list of facilities offering this level of care within the geographic area requested by the patient (or if unable, by the patient's family).  Yes   Patient/family informed of their freedom to choose among providers that offer the needed level of care, that participate in Medicare, Medicaid or managed care program needed by the patient, have an available bed and are willing to accept the patient.  Yes   Patient/family informed of [facility]'s ownership interest in [facility] and [facility], as well as of the fact that they are under no obligation to receive care at these facilities.  PASRR submitted to EDS on 01/02/16     PASRR number received on 01/02/16     Existing PASRR number confirmed on       FL2 transmitted to all facilities in geographic area requested by pt/family on 01/02/16     FL2 transmitted to all facilities within larger geographic area on       Patient informed that his/her managed care company has contracts with or will negotiate with certain facilities, including the following:        Yes   Patient/family informed of bed offers received.  Patient chooses bed at [facility]     Physician recommends and patient chooses bed at      Patient to be transferred to [facility] on 01/04/16.  Patient to be transferred to facility by Ambulance [facility]y [facility])     Patient family notified on 01/04/16 of transfer.  Name of family member notified:  Daughter:  Resa Miner     PHYSICIAN Please sign FL2, Please prepare priority  discharge summary, including medications, Please prepare prescriptions     Additional Comment:    _______________________________________________ Williemae Area, LCSW 336 [phone]

## 2016-01-15 NOTE — Progress Notes (Signed)
Pt transferred to: [facility] Anticipated date of transfer: 01/04/16 Transported by: Ambulance ([facility]) Time Tentatively Scheduled for: 2:00 PM Family notified: Daughter:  Resa Miner Report # given to nursing to call report. DC summary sent to facility. Patient and daughter are pleased with d;c plan to [facility] and denied any further questions or needs.  .    CSW signing off.  Kendell Bane, LCSW [phone]

## 2016-01-18 ENCOUNTER — Encounter: Payer: Self-pay | Admitting: *Deleted

## 2016-01-18 NOTE — Patient Outreach (Signed)
01/18/16- Telephone call to Roanna Epley at [facility] to collaborate regarding plan of care, pt passed away over the weekend.  RN CM faxed note to primary MD office - Ronnald Collum NP and notified pt passed away.  Jacqlyn Larsen [facility], [place] Coordinator [phone]

## 2016-02-03 DEATH — deceased

## 2016-02-12 IMAGING — CT CT ABD-PELV W/O CM
2 of 4 series · 15 of 46 positions shown, 17 images · non-contrast
Comparison: 02/01/2010 and 10/25/2010 ultrasound.

CLINICAL DATA: 87-year-old female with abdominal pain, diarrhea and
fever for 3 days. Patient with chronic renal failure.

EXAM:
CT ABDOMEN AND PELVIS WITHOUT CONTRAST
TECHNIQUE: Multidetector CT imaging of the abdomen and pelvis was performed
following the standard protocol without IV contrast.

[Series 2: abdomen/pelvis w/o contrast · axial · non-contrast · 0.64mm/px · z∈[-420,-25]mm · 12 of 91 slices shown, 14 images]
[im 8/91  soft-tissue]
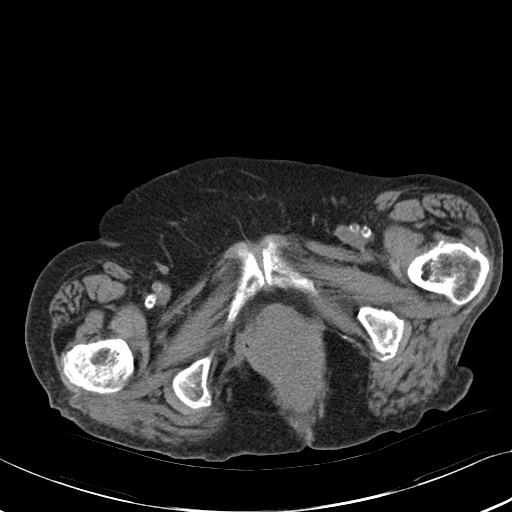
[im 8/91  bone]
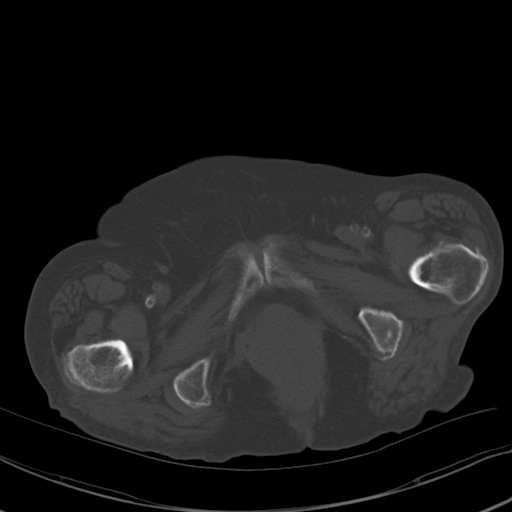
[im 15/91  soft-tissue]
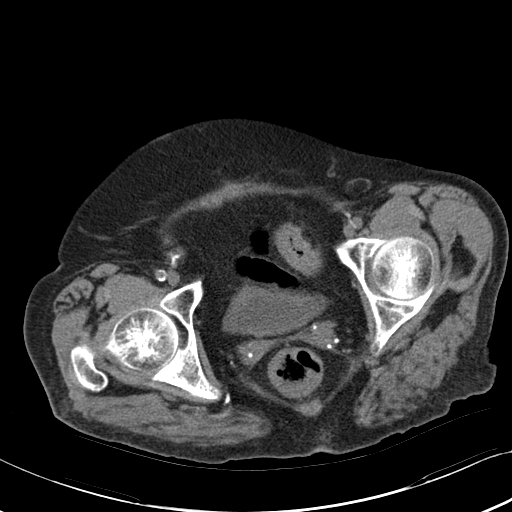
[im 22/91  soft-tissue]
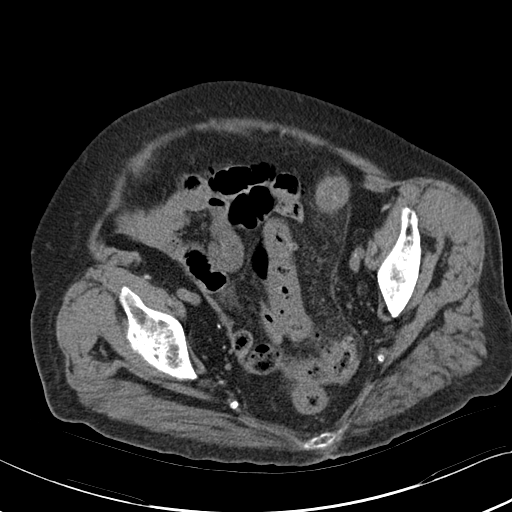
[im 29/91  soft-tissue]
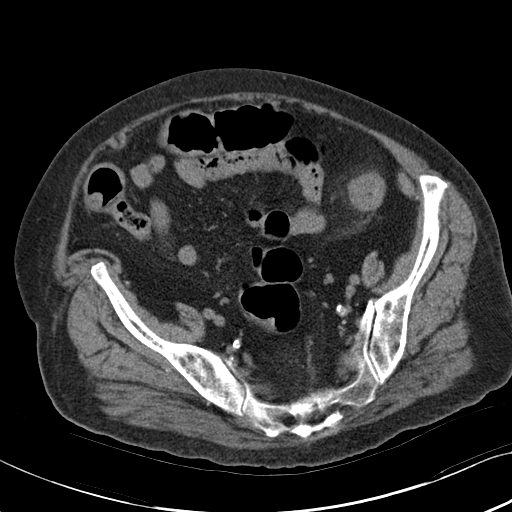
[im 37/91  soft-tissue]
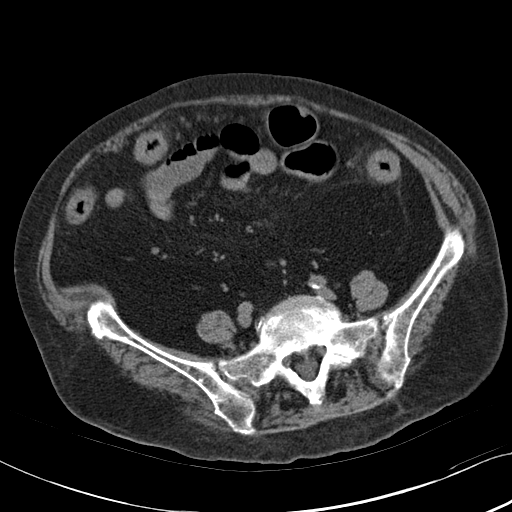
[im 44/91  soft-tissue]
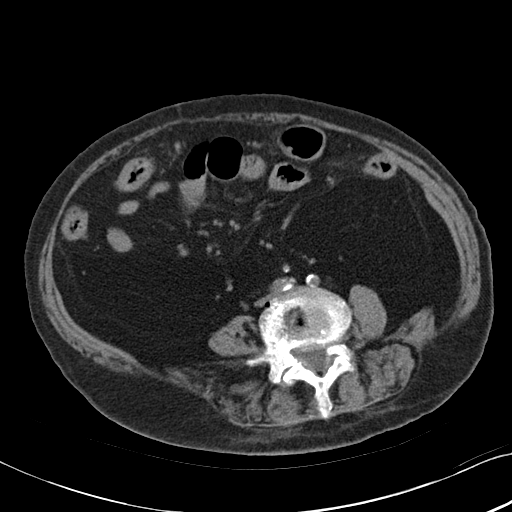
[im 51/91  soft-tissue]
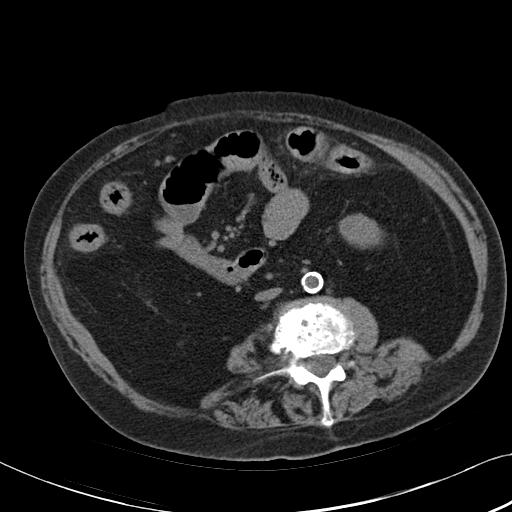
[im 58/91  soft-tissue]
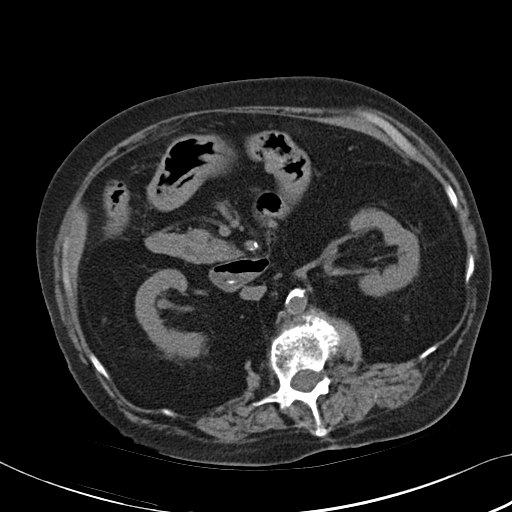
[im 65/91  soft-tissue]
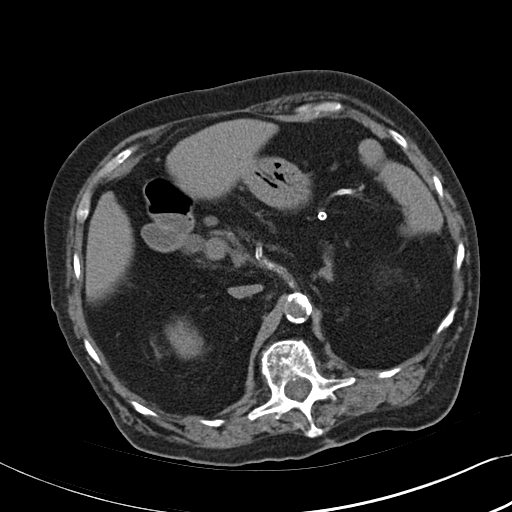
[im 65/91  bone]
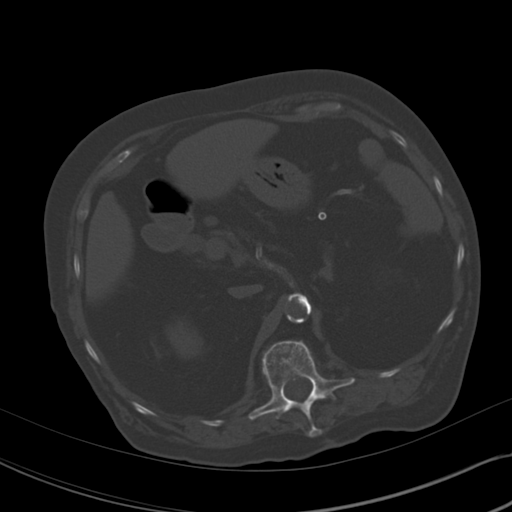
[im 73/91  soft-tissue]
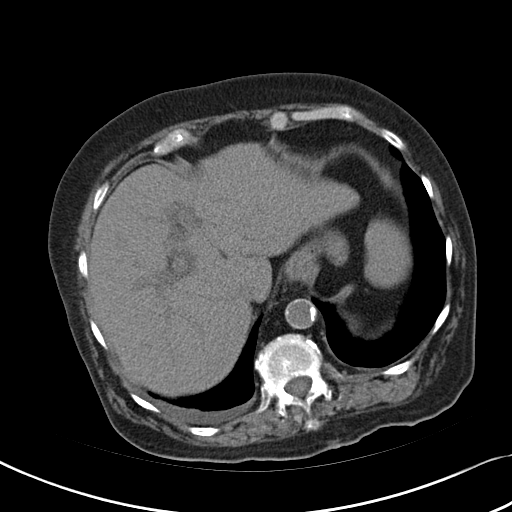
[im 80/91  soft-tissue]
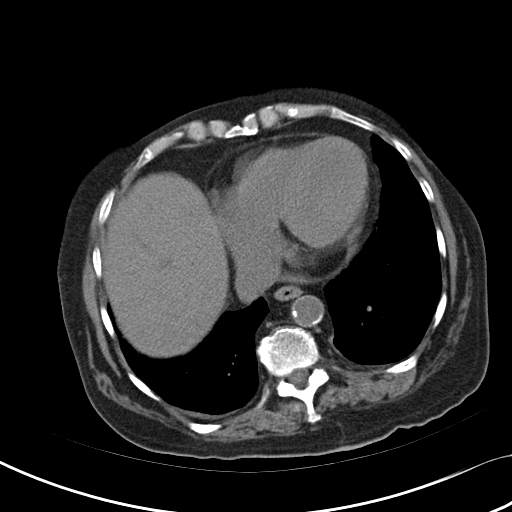
[im 87/91  soft-tissue]
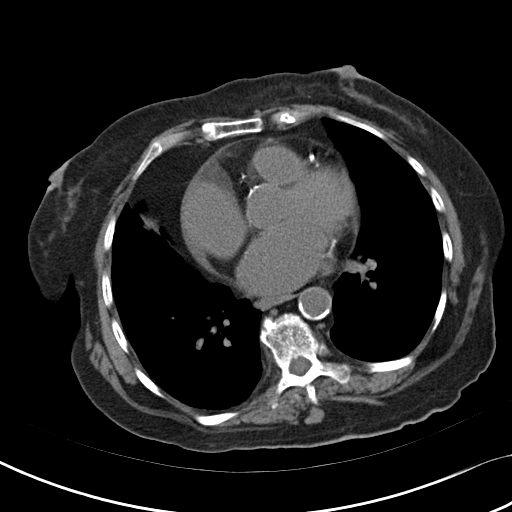

[Series 4: mpr cor 3.0mm · coronal · 0.62mm/px · 3 of 89 slices shown]
[im 30/89  soft-tissue]
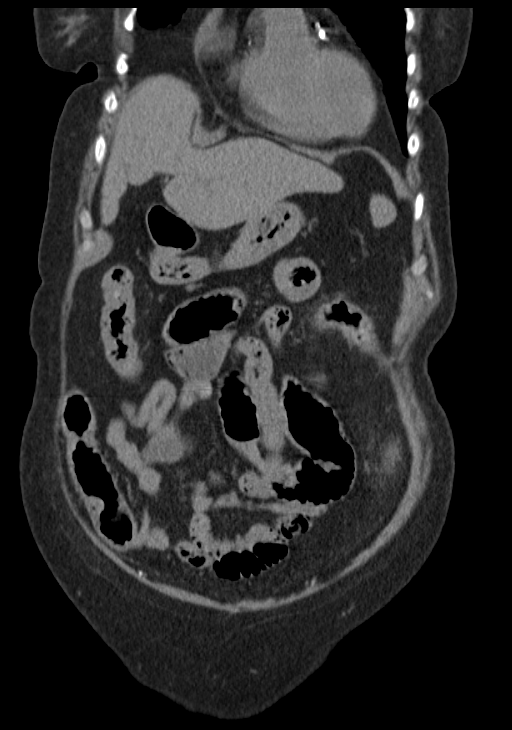
[im 40/89  soft-tissue]
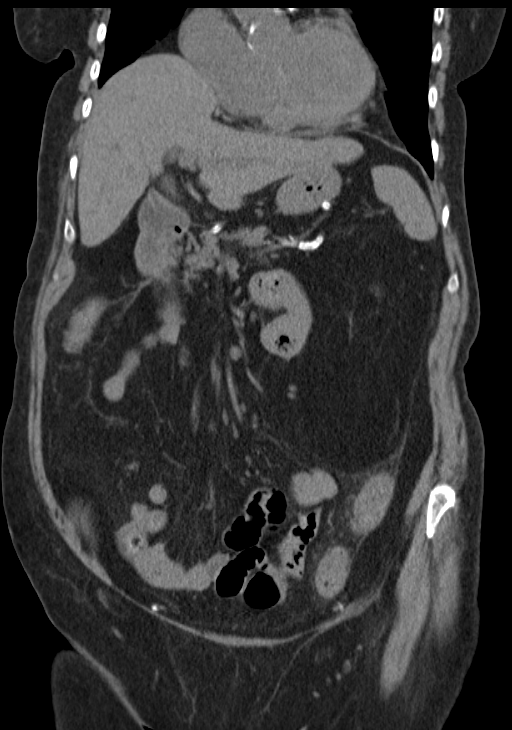
[im 49/89  soft-tissue]
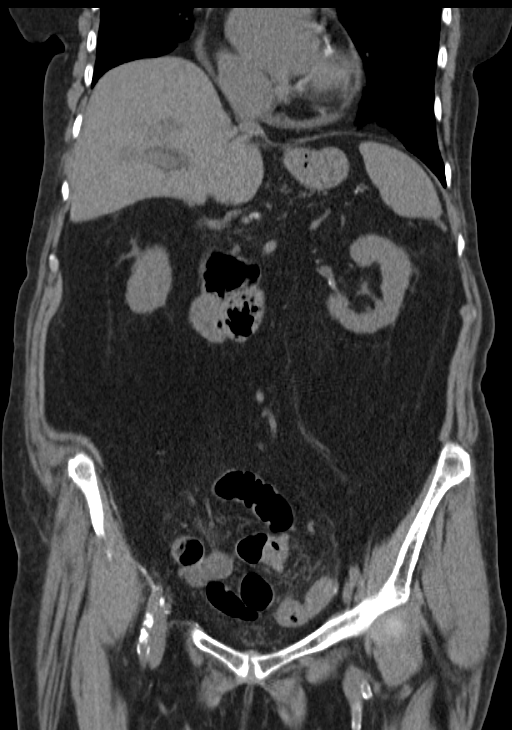

[15 of 46 positions shown; findings below may reference images not displayed]

FINDINGS: Please note that parenchymal abnormalities may be missed without
intravenous contrast.

Lower chest: A trace right pleural effusion is noted. Cardiomegaly
noted.

Hepatobiliary: A 1.1 x 1.4 cm fat containing lesion within the right
liver is unchanged and compatible with a benign fatty lesion. The
patient is status post cholecystectomy. There is no evidence of
intrahepatic biliary dilatation.

Pancreas: Unremarkable

Spleen: Unremarkable

Adrenals/Urinary Tract: Mild bilateral renal cortical atrophy noted.
A probable cysts within the left lower pole identified. The adrenal
glands and bladder are unremarkable.

Stomach/Bowel: There is circumferential wall thickening of the
descending colon compatible with colitis. There is equivocal wall
thickening of portions of the transverse colon. There is no evidence
of bowel obstruction, pneumatosis, pneumoperitoneum or focal
collection/abscess.

Vascular/Lymphatic: Heavy atherosclerotic calcifications of the
aorta, mesenteric arteries and origins are the renal arteries noted.
There is no evidence of aortic aneurysm. No enlarged lymph nodes are
identified.

Reproductive: Patient is status post hysterectomy. There is no
evidence of adnexal mass. No free fluid identified.

Other: No acute or suspicious abnormality.

Musculoskeletal: No acute or suspicious abnormalities are
identified. Moderate degenerative changes of the lumbar spine noted.
IMPRESSION: Colitis involving the descending colon with equivocal involvement of
the transverse colon. Differential includes infectious, inflammatory
or ischemic colitis given heavy atherosclerotic calcification of the
mesenteric arteries. No evidence of bowel obstruction,
pneumoperitoneum, pneumatosis or abscess.

Trace right pleural effusion.

Cardiomegaly.

## 2016-02-17 IMAGING — CR DG ANKLE 2V *R*
2 series · 2 of 2 positions shown · non-contrast
Comparison: None.

CLINICAL DATA: Acute onset of right ankle pain.  Initial encounter.

EXAM:
RIGHT ANKLE - 2 VIEW

[ankle ap]
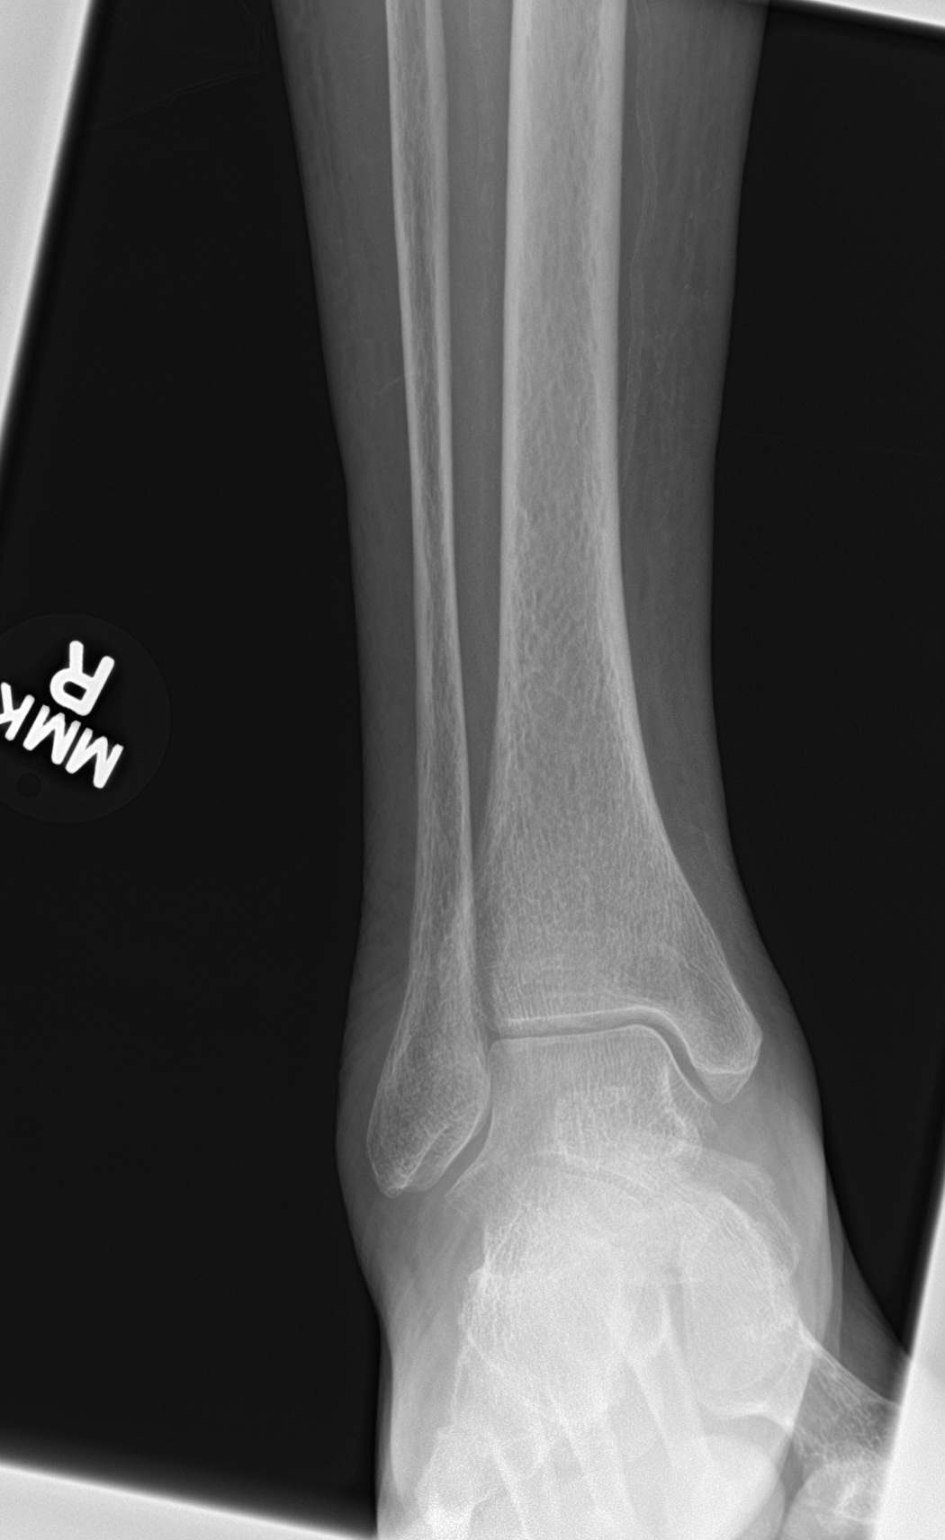

[ankle lat]
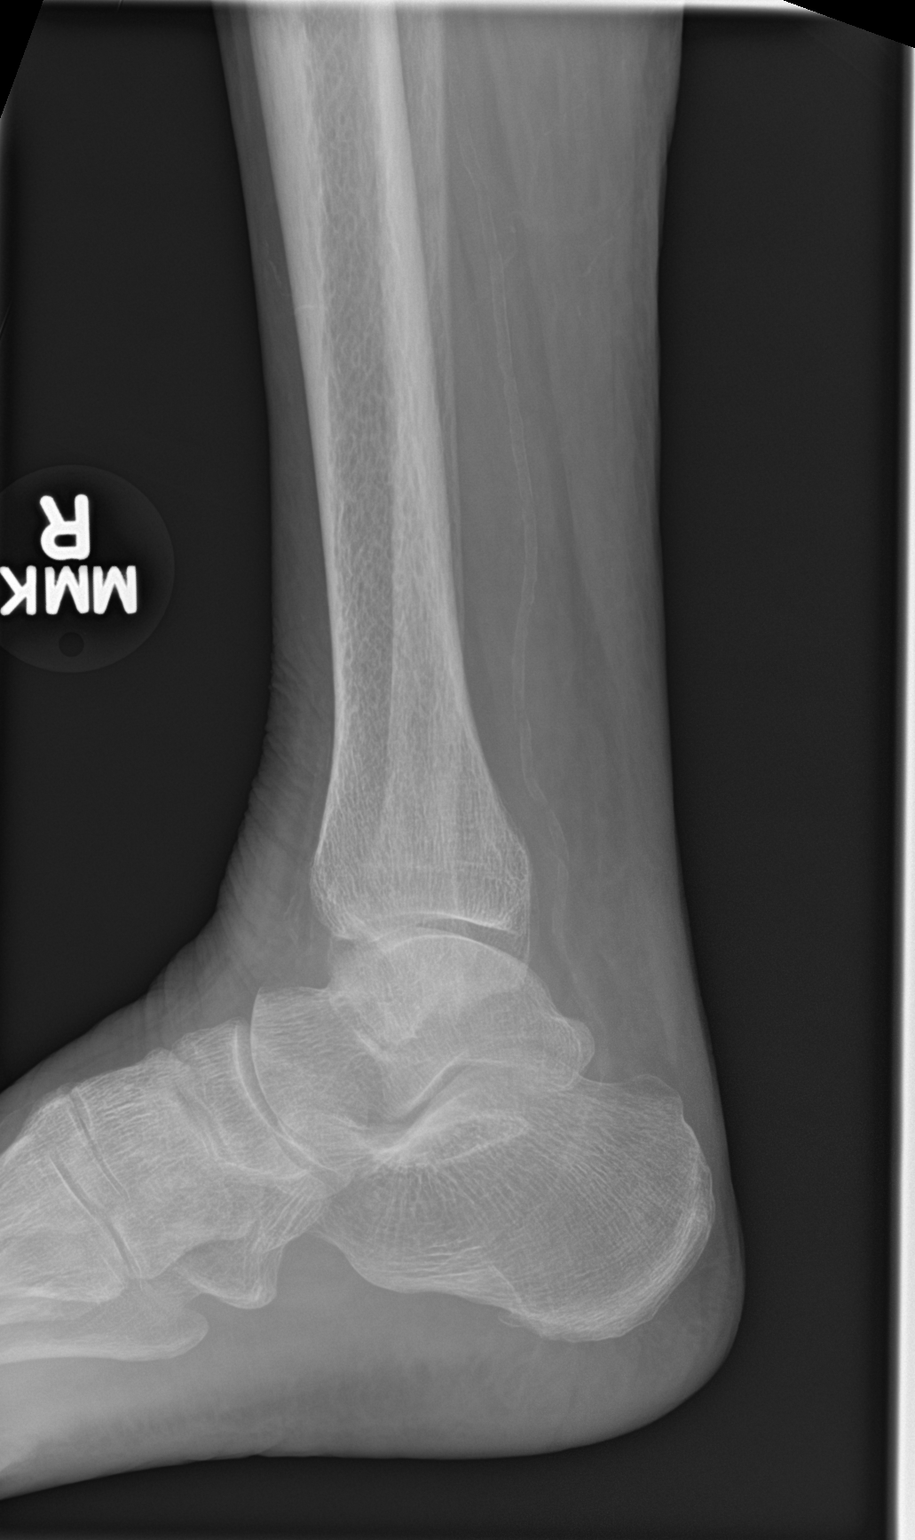

[2 of 2 positions shown; findings below may reference images not displayed]

FINDINGS: There is no evidence of fracture or dislocation. The ankle mortise
is intact; the interosseous space is within normal limits. No talar
tilt or subluxation is seen.

The joint spaces are preserved. Mild medial soft tissue swelling is
noted. Diffuse vascular calcifications are seen.
IMPRESSION: 1. No evidence of fracture or dislocation.
2. Diffuse vascular calcifications seen.

## 2016-02-17 IMAGING — DX DG ABDOMEN 1V
1 series · 1 of 1 positions shown · non-contrast
Comparison: CT abdomen and pelvis 12/24/2015.  Abdomen 08/14/2015.

CLINICAL DATA: Lower abdominal pain with distention and diarrhea
for 5 days.

EXAM:
ABDOMEN - 1 VIEW

[abdomen kub]
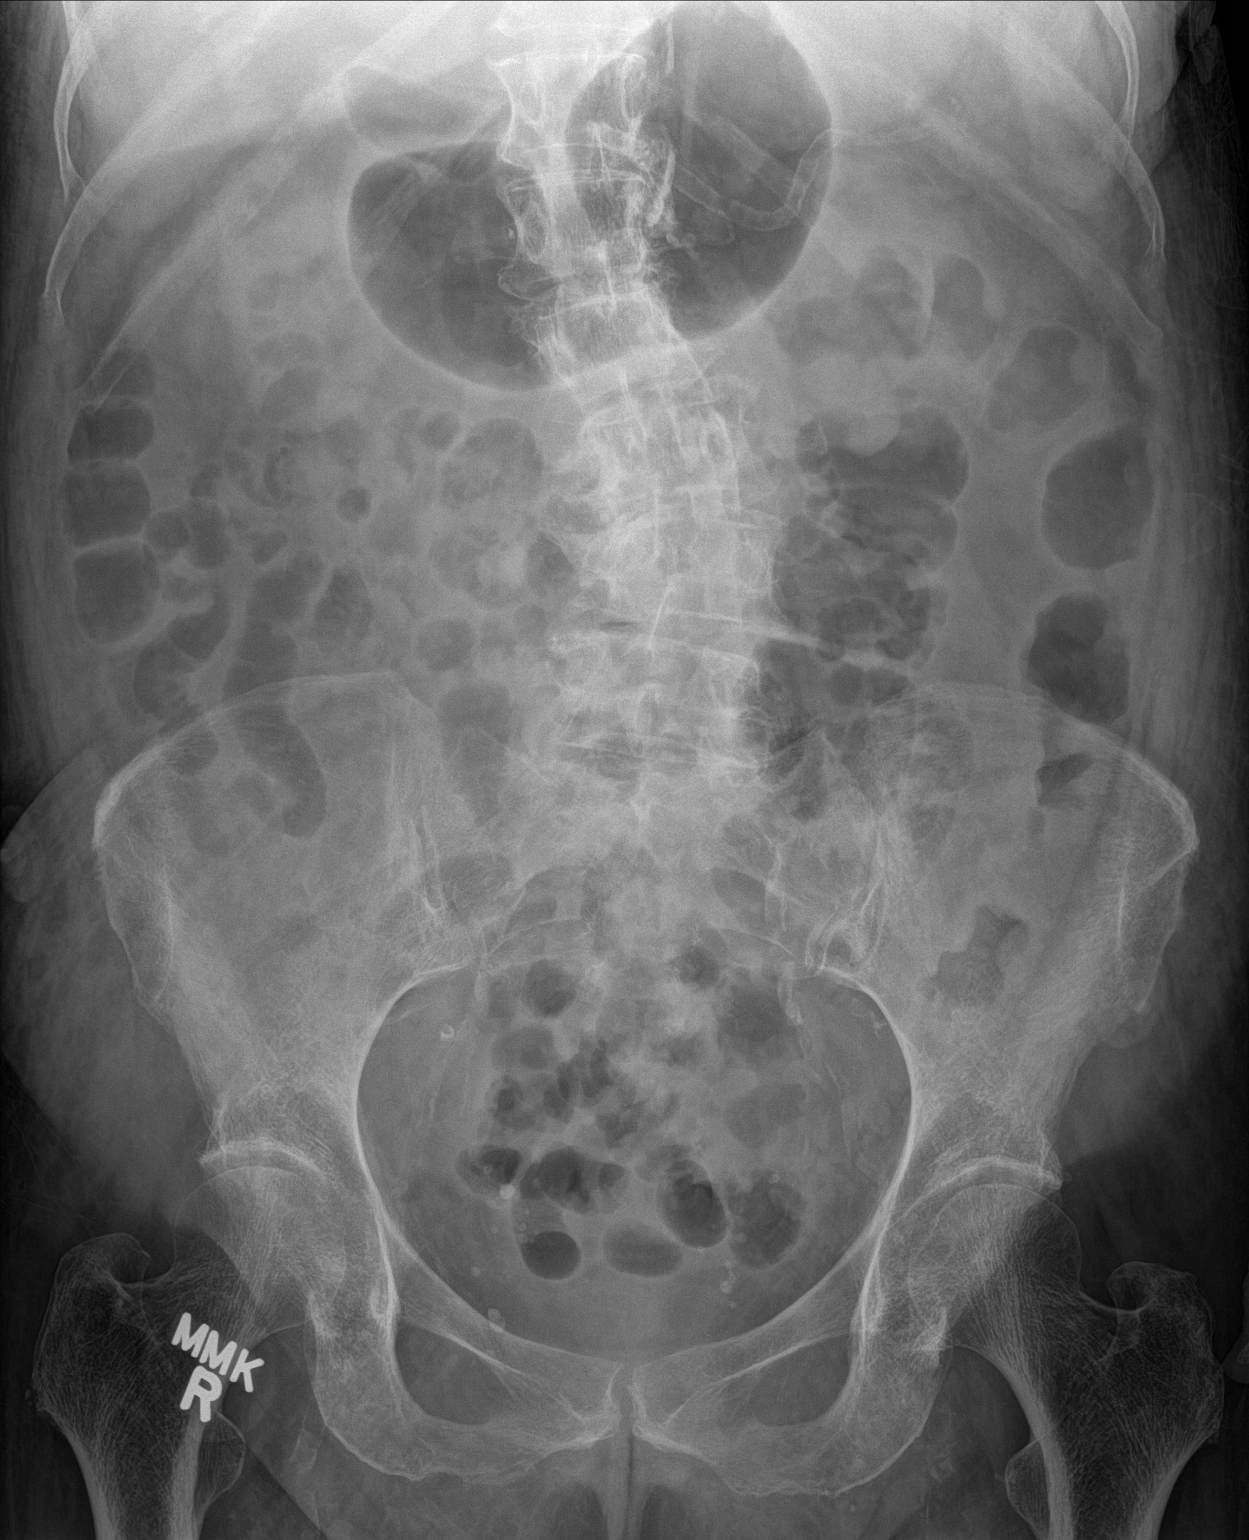

[1 of 1 positions shown; findings below may reference images not displayed]

FINDINGS: Scattered gas and stool in the colon. No small or large bowel
distention. Mild thumbprinting in the descending colon corresponding
to wall thickening from colitis seen on previous CT scan. No
radiopaque stones. Calcified phleboliths in the pelvis. Vascular
calcifications. Degenerative changes and scoliosis of the lumbar
spine with convexity towards the left.
IMPRESSION: Nonobstructive bowel gas pattern. Thumb printing demonstrated over
the descending colon corresponding to changes of colitis seen on
prior CT scan.

## 2016-03-14 ENCOUNTER — Ambulatory Visit: Payer: Medicare Other | Admitting: Nurse Practitioner
